# Patient Record
Sex: Female | Born: 1990
Health system: Southern US, Community
[De-identification: ages and names within clinical notes are randomized; demographics above are authoritative.]

## PROBLEM LIST (undated history)

## (undated) ENCOUNTER — Inpatient Hospital Stay (HOSPITAL_COMMUNITY): Payer: Self-pay

## (undated) DIAGNOSIS — I10 Essential (primary) hypertension: Secondary | ICD-10-CM

## (undated) DIAGNOSIS — O26619 Liver and biliary tract disorders in pregnancy, unspecified trimester: Secondary | ICD-10-CM

## (undated) DIAGNOSIS — K8020 Calculus of gallbladder without cholecystitis without obstruction: Secondary | ICD-10-CM

## (undated) DIAGNOSIS — O24419 Gestational diabetes mellitus in pregnancy, unspecified control: Secondary | ICD-10-CM

## (undated) HISTORY — DX: Calculus of gallbladder without cholecystitis without obstruction: O26.619

## (undated) HISTORY — DX: Calculus of gallbladder without cholecystitis without obstruction: K80.20

## (undated) HISTORY — PX: NO PAST SURGERIES: SHX2092

---

## 2014-05-05 NOTE — L&D Delivery Note (Cosign Needed)
Delivery Note   Patient presented for IOL 2/2 cholestasis. 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 Ripened with cytotec and foley bulb, then pitocin started. At 11:00 AM patient complete and pushed for 45 minutes before we were called for delivery, and after another 15 minutes of pushing there was delivery. Decels with the second half of pushing, with good recovery.  At 11:53 AM a viable female was delivered via Vaginal, Spontaneous Delivery (Presentation: ; Occiput Anterior).  APGAR: 8, 9; weight 3425 g .   Placenta status: Intact, Spontaneous.  Cord: 3 vessels with the following complications: partial third degree perineal laceration.  Cord pH: not obtained  Anesthesia: Epidural  Episiotomy: None Lacerations: 3rd degree partial Suture Repair: external anal sphincter reinforced with two interrupted 3-0 vicryl sutures; perineal laceration repaired with 3-0 vicryl as well Est. Blood Loss (mL):  423 ml  Mom to postpartum.  Baby to Couplet care / Skin to Skin.  Amanda Ellison 02/09/2015, 12:41 PM

## 2014-07-19 LAB — PREGNANCY, URINE: Preg Test, Ur: POSITIVE

## 2014-08-07 ENCOUNTER — Inpatient Hospital Stay (HOSPITAL_COMMUNITY)
Admission: AD | Admit: 2014-08-07 | Discharge: 2014-08-07 | Disposition: A | Discharge status: Home / Self Care | Payer: Medicaid Other | Source: Ambulatory Visit | Attending: Obstetrics & Gynecology | Admitting: Obstetrics & Gynecology

## 2014-08-07 ENCOUNTER — Encounter (HOSPITAL_COMMUNITY): Payer: Self-pay | Admitting: Emergency Medicine

## 2014-08-07 DIAGNOSIS — O9989 Other specified diseases and conditions complicating pregnancy, childbirth and the puerperium: Secondary | ICD-10-CM

## 2014-08-07 DIAGNOSIS — R109 Unspecified abdominal pain: Secondary | ICD-10-CM | POA: Diagnosis not present

## 2014-08-07 DIAGNOSIS — O26899 Other specified pregnancy related conditions, unspecified trimester: Secondary | ICD-10-CM

## 2014-08-07 DIAGNOSIS — O219 Vomiting of pregnancy, unspecified: Secondary | ICD-10-CM

## 2014-08-07 DIAGNOSIS — Z3A10 10 weeks gestation of pregnancy: Secondary | ICD-10-CM | POA: Insufficient documentation

## 2014-08-07 DIAGNOSIS — O210 Mild hyperemesis gravidarum: Secondary | ICD-10-CM | POA: Diagnosis not present

## 2014-08-07 LAB — CBC WITH DIFFERENTIAL/PLATELET
BASOS ABS: 0 10*3/uL (ref 0.0–0.1)
BASOS PCT: 0 % (ref 0–1)
Eosinophils Absolute: 0 10*3/uL (ref 0.0–0.7)
Eosinophils Relative: 0 % (ref 0–5)
HCT: 35.2 % — ABNORMAL LOW (ref 36.0–46.0)
Hemoglobin: 11.7 g/dL — ABNORMAL LOW (ref 12.0–15.0)
Lymphocytes Relative: 28 % (ref 12–46)
Lymphs Abs: 1.9 10*3/uL (ref 0.7–4.0)
MCH: 26.7 pg (ref 26.0–34.0)
MCHC: 33.2 g/dL (ref 30.0–36.0)
MCV: 80.2 fL (ref 78.0–100.0)
MONO ABS: 0.5 10*3/uL (ref 0.1–1.0)
Monocytes Relative: 8 % (ref 3–12)
NEUTROS ABS: 4.3 10*3/uL (ref 1.7–7.7)
NEUTROS PCT: 64 % (ref 43–77)
Platelets: 276 10*3/uL (ref 150–400)
RBC: 4.39 MIL/uL (ref 3.87–5.11)
RDW: 13.3 % (ref 11.5–15.5)
WBC: 6.8 10*3/uL (ref 4.0–10.5)

## 2014-08-07 LAB — URINALYSIS, ROUTINE W REFLEX MICROSCOPIC
Bilirubin Urine: NEGATIVE
Glucose, UA: NEGATIVE mg/dL
Hgb urine dipstick: NEGATIVE
Ketones, ur: NEGATIVE mg/dL
LEUKOCYTES UA: NEGATIVE
NITRITE: NEGATIVE
PROTEIN: NEGATIVE mg/dL
Specific Gravity, Urine: 1.02 (ref 1.005–1.030)
Urobilinogen, UA: 0.2 mg/dL (ref 0.0–1.0)
pH: 6.5 (ref 5.0–8.0)

## 2014-08-07 LAB — POCT PREGNANCY, URINE: Preg Test, Ur: POSITIVE — AB

## 2014-08-07 MED ORDER — PROMETHAZINE HCL 12.5 MG PO TABS
12.5000 mg | ORAL_TABLET | Freq: Four times a day (QID) | ORAL | Status: DC | PRN
Start: 2014-08-07 — End: 2014-10-14

## 2014-08-07 MED ORDER — PROMETHAZINE HCL 25 MG PO TABS
25.0000 mg | ORAL_TABLET | Freq: Once | ORAL | Status: AC
  Administered 2014-08-07: 25 mg via ORAL
  Filled 2014-08-07: qty 1

## 2014-08-07 NOTE — MAU Provider Note (Signed)
History     CSN: [id number]  Arrival date and time: 08/07/14 1703   None     Chief Complaint  Patient presents with  . Influenza   HPI Comments: Ms.Hershey Zareth is a [age] y.o. female who presents with multiple complaints. She is G1P0 at 5744w4d she is concerned about abdominal pain, nausea and vomiting. When she vomits she is concerned that she has a fever because she has the chills.  She has not tried anything for the nausea or vomiting and the vomiting is everyday. She vomits 2-3 times per day. Eating makes the vomiting worse.  The vomiting is keeping her up at night.   Abdominal Pain This is a new problem. The current episode started more than 1 month ago. The onset quality is gradual. The problem occurs intermittently. The pain is located in the suprapubic region. The pain is at a severity of 1/10. Associated symptoms include a fever, headaches, nausea and vomiting. Pertinent negatives include no dysuria or frequency.    The patient does not speak english and is present with her husband who speaks english. The patient and husband have asked that he interpret at this time.    OB History    Gravida Para Term Preterm AB TAB SAB Ectopic Multiple Living   1 0              History reviewed. No pertinent past medical history.  History reviewed. No pertinent past surgical history.  History reviewed. No pertinent family history.  History  Substance Use Topics  . Smoking status: Never Smoker   . Smokeless tobacco: Never Used  . Alcohol Use: No    Allergies: No Known Allergies  Prescriptions prior to admission  Medication Sig Dispense Refill Last Dose  . acetaminophen (TYLENOL) 650 MG CR tablet Take 650 mg by mouth every 8 (eight) hours as needed for pain.   Past Week at Unknown time  . amoxicillin (AMOXIL) 500 MG capsule Take 500 mg by mouth 3 (three) times daily.   Past Week at Unknown time   Results for orders placed or performed during the hospital encounter of 08/07/14  (from the past 48 hour(s))  Urinalysis, Routine w reflex microscopic     Status: None   Collection Time: 08/07/14  5:29 PM  Result Value Ref Range   Color, Urine YELLOW YELLOW   APPearance CLEAR CLEAR   Specific Gravity, Urine 1.020 1.005 - 1.030   pH 6.5 5.0 - 8.0   Glucose, UA NEGATIVE NEGATIVE mg/dL   Hgb urine dipstick NEGATIVE NEGATIVE   Bilirubin Urine NEGATIVE NEGATIVE   Ketones, ur NEGATIVE NEGATIVE mg/dL   Protein, ur NEGATIVE NEGATIVE mg/dL   Urobilinogen, UA 0.2 0.0 - 1.0 mg/dL   Nitrite NEGATIVE NEGATIVE   Leukocytes, UA NEGATIVE NEGATIVE    Comment: MICROSCOPIC NOT DONE ON URINES WITH NEGATIVE PROTEIN, BLOOD, LEUKOCYTES, NITRITE, OR GLUCOSE <1000 mg/dL.  Pregnancy, urine POC     Status: Abnormal   Collection Time: 08/07/14  5:32 PM  Result Value Ref Range   Preg Test, Ur POSITIVE (A) NEGATIVE    Comment:        THE SENSITIVITY OF THIS METHODOLOGY IS >24 mIU/mL   CBC with Differential     Status: Abnormal   Collection Time: 08/07/14  6:23 PM  Result Value Ref Range   WBC 6.8 4.0 - 10.5 K/uL   RBC 4.39 3.87 - 5.11 MIL/uL   Hemoglobin 11.7 (L) 12.0 - 15.0 g/dL   HCT 78.235.2 (  L) 36.0 - 46.0 %   MCV 80.2 78.0 - 100.0 fL   MCH 26.7 26.0 - 34.0 pg   MCHC 33.2 30.0 - 36.0 g/dL   RDW 09.8 11.9 - 14.7 %   Platelets 276 150 - 400 K/uL   Neutrophils Relative % 64 43 - 77 %   Neutro Abs 4.3 1.7 - 7.7 K/uL   Lymphocytes Relative 28 12 - 46 %   Lymphs Abs 1.9 0.7 - 4.0 K/uL   Monocytes Relative 8 3 - 12 %   Monocytes Absolute 0.5 0.1 - 1.0 K/uL   Eosinophils Relative 0 0 - 5 %   Eosinophils Absolute 0.0 0.0 - 0.7 K/uL   Basophils Relative 0 0 - 1 %   Basophils Absolute 0.0 0.0 - 0.1 K/uL    Review of Systems  Constitutional: Positive for fever and chills.  Gastrointestinal: Positive for nausea, vomiting and abdominal pain.  Genitourinary: Negative for dysuria, urgency and frequency.       Denies vaginal bleeding   Neurological: Positive for headaches.   Physical  Exam   Blood pressure 128/82, pulse 75, temperature 98.1 F (36.7 C), temperature source Oral, resp. rate 16, last menstrual period 05/25/2014.  Physical Exam  Constitutional: She is oriented to person, place, and time. She appears well-developed and well-nourished. No distress.  HENT:  Head: Normocephalic.  Eyes: Pupils are equal, round, and reactive to light.  Neck: Neck supple.  Cardiovascular: Normal rate and normal heart sounds.   Respiratory: Effort normal and breath sounds normal.  GI: Soft. She exhibits no distension. There is no tenderness. There is no rebound.  Musculoskeletal: Normal range of motion.  Neurological: She is alert and oriented to person, place, and time.  Skin: Skin is warm. She is not diaphoretic.  Psychiatric: Her behavior is normal.    MAU Course  Procedures  None  MDM + fetal heart tones by doppler  Phenergan 25 mg PO   Patient feels significantly better following phenergan and tolerated PO fluids.    Assessment and Plan    A:  1. Abdominal pain in pregnancy   2. Nausea and vomiting during pregnancy     P:  Discharge home in stable condition RX: Phenergan Return to MAU as needed, if symptoms worsen Small, frequent meals  Start prenatal care ASAP First trimester warning signs discussed.    Duane Lope, NP 08/07/2014 7:55 PM

## 2014-08-07 NOTE — MAU Note (Signed)
Per pt's partner, pt is here for fever and chills, and abdominal pain as well as vaginal pain at times.

## 2014-08-07 NOTE — Discharge Instructions (Signed)

## 2014-08-07 NOTE — MAU Note (Signed)
Pt states that nausea is resolved 

## 2014-08-10 ENCOUNTER — Other Ambulatory Visit (HOSPITAL_COMMUNITY): Payer: Self-pay | Admitting: Physician Assistant

## 2014-08-10 DIAGNOSIS — Z3682 Encounter for antenatal screening for nuchal translucency: Secondary | ICD-10-CM

## 2014-08-10 LAB — OB RESULTS CONSOLE GC/CHLAMYDIA
Chlamydia: NEGATIVE
Gonorrhea: NEGATIVE

## 2014-08-10 LAB — OB RESULTS CONSOLE RPR: RPR: NONREACTIVE

## 2014-08-10 LAB — OB RESULTS CONSOLE GBS: STREP GROUP B AG: POSITIVE

## 2014-08-10 LAB — OB RESULTS CONSOLE VARICELLA ZOSTER ANTIBODY, IGG: VARICELLA IGG: IMMUNE

## 2014-08-29 ENCOUNTER — Other Ambulatory Visit (HOSPITAL_COMMUNITY): Payer: Self-pay

## 2014-08-29 ENCOUNTER — Ambulatory Visit (HOSPITAL_COMMUNITY)
Admission: RE | Admit: 2014-08-29 | Discharge: 2014-08-29 | Disposition: A | Discharge status: Home / Self Care | Payer: Medicaid Other | Source: Ambulatory Visit | Attending: Physician Assistant | Admitting: Physician Assistant

## 2014-08-29 VITALS — BP 133/84 | HR 93 | Wt 183.5 lb

## 2014-08-29 DIAGNOSIS — Z3689 Encounter for other specified antenatal screening: Secondary | ICD-10-CM

## 2014-08-29 DIAGNOSIS — Z3A13 13 weeks gestation of pregnancy: Secondary | ICD-10-CM | POA: Insufficient documentation

## 2014-08-29 DIAGNOSIS — Z3682 Encounter for antenatal screening for nuchal translucency: Secondary | ICD-10-CM

## 2014-08-29 DIAGNOSIS — Z843 Family history of consanguinity: Secondary | ICD-10-CM

## 2014-08-29 DIAGNOSIS — Z36 Encounter for antenatal screening of mother: Secondary | ICD-10-CM | POA: Diagnosis not present

## 2014-08-31 DIAGNOSIS — Z843 Family history of consanguinity: Secondary | ICD-10-CM | POA: Insufficient documentation

## 2014-08-31 NOTE — Progress Notes (Signed)
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 The couple declined the use of a medical interpreter, with Mr. Ellison assisting with interpretation.   We began by reviewing the family history in detail. The couple reported that they are first cousins; Ms. Amanda Ellison and Amanda Ellison were full siblings to each other. Her mother reported died in her 89sdue to stomach problems, and Amanda Ellison died due to "old age." The family history was unremarkable for birth defects, intellectual disability, recurrent pregnancy loss, or known genetic conditions.   We reviewed, genes, chromosomes, and autosomal recessive inheritance. We discussed that children born to a consanguineous couple are at increased risk for genetic health problems.  This increase in risk is related to the possibility of passing on recessive genes. We explained that every person carries approximately 7-10 non-working genes that when received in a double dose results in recessive genetic conditions.  In general, unrelated couples have a relatively low risk of having a child with a recessive condition because the likelihood of both parents carrying the same non-working recessive gene is very low.  However, when a couple is related, they have inherited some of their genetic information from the same family member, which leads to an increased chance that they may carry the same recessive gene and have a child with a recessive condition.  For first cousin unions, the risk to have a child with a birth defect, intellectual disability,  or genetic condition is increased approximately 2-4% above the general population risk (3-5%).    We discussed that ethnicity based carrier screening for autosomal recessive conditions is available. Given [place] both the [place] we discussed the option of screening for sickle cell trait and other hemoglobin variants. We discussed that screening via hemoglobin electrophoresis is typically performed through the patient's OB office. This is available to the patient if not previously performed, and if desired. Additionally, we reviewed that hemoglobinopathies are routinely screened for as part of the [facility] newborn screening panel.  We also discussed the option of expanded carrier screening panels. This carrier screen provides carrier screening for common disease-causing mutations of greater than 100 autosomal recessive conditions and/or some X-linked conditions (when performed for a female). We reviewed that the prevalence of each condition varies (and often varies with ethnicity). Thus the couples' background risk to be a carrier for each of these various conditions would range, and in some cases be very low or unknown. Similarly, the detection rate varies with each condition and also varies in some cases with ethnicity, ranging from greater than 99% (in the case of Hemoglobinopathies) to 11% to unknown. We reviewed that a negative carrier screen would thus reduce but not eliminate the chance to be a carrier for these conditions.  For some conditions included on this pan-ethnic carrier screening panel, the pre-test carrier frequency and/or the detection rate is unknown. Thus, for some conditions included on the pan-ethnic carrier screening panel, the exact reduction of risk with a negative carrier screening result may not be able to be quantified. We reviewed that in the event that one partner is found to be a carrier for  one or more conditions, carrier screening would be available to the  partner for those conditions. When both partners are identified to be carriers for the same condition, prenatal diagnosis via amniocentesis would be available. We briefly discussed the risks, benefits, and limitations of amniocentesis.   Mr. Ellison expressed interest in pursuing expanded carrier screening for himself or the patient. However, they both currently do not have insurance. We reviewed the cost of the testing. Given the cost, they elected to defer expanded carrier screening until insurance coverage is obtained.  Additionally, the Ellison of the pregnancy is reportedly 26 years old. This couple was counseled that advanced paternal age (APA) is defined as paternal age greater than or equal to age 36.  Recent large-scale sequencing studies have shown that approximately 80% of de novo point mutations are of paternal origin.  Many studies have demonstrated a strong correlation between increased paternal age and de novo point mutations.  Although no specific data is available regarding fetal risks for fathers 4+ years old at conception, it is apparent that the overall risk for single gene conditions is increased.  To estimate the relative increase in risk of a genetic disorder with APA, the heritability of the disease must be considered.  Assuming an approximate 2x increase in risk for conditions that are exclusively paternal in origin, the risk for each individual condition is still relatively low.  It is estimated that the overall chance for a de novo mutation is ~0.5%.  There is a wide range of conditions which can be caused by new dominant gene mutations (achondroplasia, neurofibromatosis, Marfan syndrome etc.).  Genetic testing for each individual single gene condition is not warranted or available unless ultrasound or family history concerns lend suspicion to a specific condition.  We discussed the recommendation for a detailed ultrasound at 18+ weeks gestation and a follow up ultrasound at ~28  weeks to monitor fetal growth.  Amanda Ellison elected to have first trimester screening at the time of today's visit. We reviewed the conditions for which this screens including the detection rates and false positive rates. We discussed that first trimester screening is not diagnostic.   Amanda Ellison denied exposure to environmental toxins or chemical agents. She denied the use of alcohol, tobacco or street drugs. She denied significant viral illnesses during the course of her pregnancy. Her medical and surgical histories were noncontributory.   I counseled this couple regarding the above risks and available options.  The approximate face-to-face time with the genetic counselor was 35 minutes.  Chipper Oman, MS Certified Genetic Counselor 08/31/2014

## 2014-10-03 ENCOUNTER — Ambulatory Visit (HOSPITAL_COMMUNITY)
Admission: RE | Admit: 2014-10-03 | Discharge: 2014-10-03 | Disposition: A | Discharge status: Home / Self Care | Payer: Medicaid Other | Source: Ambulatory Visit | Attending: Physician Assistant | Admitting: Physician Assistant

## 2014-10-03 ENCOUNTER — Encounter (HOSPITAL_COMMUNITY): Payer: Self-pay

## 2014-10-03 DIAGNOSIS — Z3689 Encounter for other specified antenatal screening: Secondary | ICD-10-CM | POA: Insufficient documentation

## 2014-10-03 DIAGNOSIS — Z843 Family history of consanguinity: Secondary | ICD-10-CM | POA: Insufficient documentation

## 2014-10-03 DIAGNOSIS — Z3A18 18 weeks gestation of pregnancy: Secondary | ICD-10-CM | POA: Insufficient documentation

## 2014-10-03 DIAGNOSIS — Z36 Encounter for antenatal screening of mother: Secondary | ICD-10-CM | POA: Insufficient documentation

## 2014-10-14 ENCOUNTER — Encounter (HOSPITAL_COMMUNITY): Payer: Self-pay | Admitting: *Deleted

## 2014-10-14 ENCOUNTER — Inpatient Hospital Stay (HOSPITAL_COMMUNITY)
Admission: AD | Admit: 2014-10-14 | Discharge: 2014-10-14 | Disposition: A | Discharge status: Home / Self Care | Payer: Medicaid Other | Source: Ambulatory Visit | Attending: Obstetrics and Gynecology | Admitting: Obstetrics and Gynecology

## 2014-10-14 DIAGNOSIS — O26892 Other specified pregnancy related conditions, second trimester: Secondary | ICD-10-CM | POA: Diagnosis not present

## 2014-10-14 DIAGNOSIS — R51 Headache: Secondary | ICD-10-CM | POA: Insufficient documentation

## 2014-10-14 DIAGNOSIS — K831 Obstruction of bile duct: Secondary | ICD-10-CM

## 2014-10-14 DIAGNOSIS — O26899 Other specified pregnancy related conditions, unspecified trimester: Secondary | ICD-10-CM

## 2014-10-14 DIAGNOSIS — Z3A20 20 weeks gestation of pregnancy: Secondary | ICD-10-CM | POA: Diagnosis not present

## 2014-10-14 DIAGNOSIS — O9989 Other specified diseases and conditions complicating pregnancy, childbirth and the puerperium: Secondary | ICD-10-CM | POA: Diagnosis not present

## 2014-10-14 DIAGNOSIS — R519 Headache, unspecified: Secondary | ICD-10-CM

## 2014-10-14 DIAGNOSIS — O26642 Intrahepatic cholestasis of pregnancy, second trimester: Secondary | ICD-10-CM

## 2014-10-14 DIAGNOSIS — L299 Pruritus, unspecified: Secondary | ICD-10-CM

## 2014-10-14 DIAGNOSIS — O4402 Placenta previa specified as without hemorrhage, second trimester: Secondary | ICD-10-CM

## 2014-10-14 DIAGNOSIS — O26612 Liver and biliary tract disorders in pregnancy, second trimester: Secondary | ICD-10-CM

## 2014-10-14 DIAGNOSIS — R109 Unspecified abdominal pain: Secondary | ICD-10-CM | POA: Insufficient documentation

## 2014-10-14 DIAGNOSIS — O0930 Supervision of pregnancy with insufficient antenatal care, unspecified trimester: Secondary | ICD-10-CM

## 2014-10-14 LAB — URINALYSIS, ROUTINE W REFLEX MICROSCOPIC
Bilirubin Urine: NEGATIVE
GLUCOSE, UA: NEGATIVE mg/dL
Hgb urine dipstick: NEGATIVE
Ketones, ur: NEGATIVE mg/dL
Leukocytes, UA: NEGATIVE
NITRITE: NEGATIVE
PH: 6.5 (ref 5.0–8.0)
Protein, ur: NEGATIVE mg/dL
Specific Gravity, Urine: 1.015 (ref 1.005–1.030)
UROBILINOGEN UA: 0.2 mg/dL (ref 0.0–1.0)

## 2014-10-14 MED ORDER — ACETAMINOPHEN 325 MG PO TABS
650.0000 mg | ORAL_TABLET | Freq: Once | ORAL | Status: AC
  Administered 2014-10-14: 650 mg via ORAL
  Filled 2014-10-14: qty 2

## 2014-10-14 MED ORDER — PNV PRENATAL PLUS MULTIVITAMIN 27-1 MG PO TABS: 1.0000 | ORAL_TABLET | Freq: Every day | ORAL | Status: DC

## 2014-10-14 NOTE — Discharge Instructions (Signed)
Safe Over the Counter Medications in Pregnancy   Acne:  Benzoyl Peroxide  Salicylic Acid   Backache/Headache:  Tylenol: 2 regular strength every 4 hours OR        2 Extra strength every 6 hours   Colds/Coughs/Allergies:  Benadryl (alcohol free) 25 mg every 6 hours as needed  Breath right strips  Claritin  Cepacol throat lozenges  Chloraseptic throat spray  Cold-Eeze- up to three times per day  Cough drops, alcohol free  Flonase (by prescription only)  Guaifenesin  Mucinex  Robitussin DM (plain only, alcohol free)  Saline nasal spray/drops  Sudafed (pseudoephedrine) & Actifed * use only after [redacted] weeks gestation and if you do not have high blood pressure  Tylenol  Vicks Vaporub  Zinc lozenges  Zyrtec   Constipation:  Colace  Ducolax suppositories  Fleet enema  Glycerin suppositories  Metamucil  Milk of magnesia  Miralax  Senokot  Smooth move tea   Diarrhea:  Kaopectate  Imodium A-D   *NO pepto Bismol   Hemorrhoids:  Anusol  Anusol HC  Preparation H  Tucks   Indigestion:  Tums  Maalox  Mylanta  Zantac  Pepcid   Insomnia:  Benadryl (alcohol free) 25mg  every 6 hours as needed  Tylenol PM  Unisom, no Gelcaps   Leg Cramps:  Tums  MagGel   Nausea/Vomiting:  Bonine  Dramamine  Emetrol  Ginger extract  Sea bands  Meclizine  Nausea medication to take during pregnancy:  Unisom (doxylamine succinate 25 mg tablets) Take one tablet daily at bedtime. If symptoms are not adequately controlled, the dose can be increased to a maximum recommended dose of two tablets daily (1/2 tablet in the morning, 1/2 tablet mid-afternoon and one at bedtime).  Vitamin B6 100mg  tablets. Take one tablet twice a day (up to 200 mg per day).   Skin Rashes:  Aveeno products  Benadryl cream or 25mg  every 6 hours as needed  Calamine Lotion  1% cortisone cream   Yeast infection:  Gyne-lotrimin 7  Monistat 7    **If taking multiple medications, please check  labels to avoid duplicating the same active ingredients  **take medication as directed on the label  ** Do not exceed 4000 mg of tylenol in 24 hours  **Do not take medications that contain aspirin or ibuprofen     Abdominal Pain During Pregnancy Abdominal pain is common in pregnancy. Most of the time, it does not cause harm. There are many causes of abdominal pain. Some causes are more serious than others. Some of the causes of abdominal pain in pregnancy are easily diagnosed. Occasionally, the diagnosis takes time to understand. Other times, the cause is not determined. Abdominal pain can be a sign that something is very wrong with the pregnancy, or the pain may have nothing to do with the pregnancy at all. For this reason, always tell your health care provider if you have any abdominal discomfort. HOME CARE INSTRUCTIONS  Monitor your abdominal pain for any changes. The following actions may help to alleviate any discomfort you are experiencing:  Do not have sexual intercourse or put anything in your vagina until your symptoms go away completely.  Get plenty of rest until your pain improves.  Drink clear fluids if you feel nauseous. Avoid solid food as long as you are uncomfortable or nauseous.  Only take over-the-counter or prescription medicine as directed by your health care provider.  Keep all follow-up appointments with your health care provider. SEEK IMMEDIATE MEDICAL CARE IF:  You are bleeding, leaking fluid, or passing tissue from the vagina.  You have increasing pain or cramping.  You have persistent vomiting.  You have painful or bloody urination.  You have a fever.  You notice a decrease in your baby's movements.  You have extreme weakness or feel faint.  You have shortness of breath, with or without abdominal pain.  You develop a severe headache with abdominal pain.  You have abnormal vaginal discharge with abdominal pain.  You have persistent diarrhea.  You  have abdominal pain that continues even after rest, or gets worse. MAKE SURE YOU:   Understand these instructions.  Will watch your condition.  Will get help right away if you are not doing well or get worse. Document Released: 04/21/2005 Document Revised: 02/09/2013 Document Reviewed: 11/18/2012 [facility] Patient Information 2015 [place], [place]. This information is not intended to replace advice given to you by your health care provider. Make sure you discuss any questions you have with your health care provider.

## 2014-10-14 NOTE — MAU Provider Note (Signed)
History     CSN: [id number]  Arrival date and time: 10/14/14 3435   First Provider Initiated Contact with Patient 10/14/14 1926      Chief Complaint  Patient presents with  . Headache  . Abdominal Pain   HPI  Ms. Kayly Wright is a 24 y.o. G1P0 at [redacted]w[redacted]d here with report of intermittent headache that started at noon today.  Denies problem with vision.  Has not tried any medication for headache.  Also reports  lower abdominal pain x1 week.  Pain is described as sharp after urination is completed.  No pain at the time of urination.   No prenatal care to date.  Desires to have care at [facility].  Complaining of itching all over body.  No rash noted.  No change in soap/lotion/detergents.  Takes hot showers.   History reviewed. No pertinent past medical history.  History reviewed. No pertinent past surgical history.  History reviewed. No pertinent family history.  History  Substance Use Topics  . Smoking status: Never Smoker   . Smokeless tobacco: Never Used  . Alcohol Use: No    Allergies: No Known Allergies  Prescriptions prior to admission  Medication Sig Dispense Refill Last Dose  . acetaminophen (TYLENOL) 650 MG CR tablet Take 650 mg by mouth every 8 (eight) hours as needed for pain.   Past Week at Unknown time  . amoxicillin (AMOXIL) 500 MG capsule Take 500 mg by mouth 3 (three) times daily.   Past Week at Unknown time  . promethazine (PHENERGAN) 12.5 MG tablet Take 1 tablet (12.5 mg total) by mouth every 6 (six) hours as needed for nausea or vomiting. 30 tablet 0     Review of Systems  Constitutional: Negative for fever and chills.  Eyes: Negative for blurred vision, double vision and photophobia.  Gastrointestinal: Positive for abdominal pain. Negative for nausea and vomiting.  Genitourinary: Negative for dysuria, urgency and hematuria.  Musculoskeletal: Negative for back pain.  Neurological: Positive for headaches.  All other systems reviewed and  are negative.  Physical Exam   Blood pressure 128/74, pulse 81, temperature 98.1 F (36.7 C), temperature source Oral, resp. rate 18, weight 88.451 kg (195 lb), last menstrual period 05/25/2014, SpO2 100 %.  Physical Exam  Constitutional: She appears well-developed and well-nourished. No distress.  HENT:  Head: Normocephalic and atraumatic.  Eyes: EOM are normal.  Neck: Normal range of motion.  Cardiovascular: Normal rate.   Respiratory: Effort normal. No respiratory distress.  GI: Soft. There is no tenderness.  Musculoskeletal: Normal range of motion.  Neurological: She is alert.  Skin: Skin is warm and dry. No rash noted.   Results for orders placed or performed during the hospital encounter of 10/14/14 (from the past 24 hour(s))  Urinalysis, Routine w reflex microscopic (not at [facility])     Status: None   Collection Time: 10/14/14  6:45 PM  Result Value Ref Range   Color, Urine YELLOW YELLOW   APPearance CLEAR CLEAR   Specific Gravity, Urine 1.015 1.005 - 1.030   pH 6.5 5.0 - 8.0   Glucose, UA NEGATIVE NEGATIVE mg/dL   Hgb urine dipstick NEGATIVE NEGATIVE   Bilirubin Urine NEGATIVE NEGATIVE   Ketones, ur NEGATIVE NEGATIVE mg/dL   Protein, ur NEGATIVE NEGATIVE mg/dL   Urobilinogen, UA 0.2 0.0 - 1.0 mg/dL   Nitrite NEGATIVE NEGATIVE   Leukocytes, UA NEGATIVE NEGATIVE    MAU Course  Procedures  MDM No evidence for UTI per u/a No rash  to go along with itching.  No new soaps/detergents/lotions.  Pt takes hot showers.  Bile salts pending to eval for possibly cholestasis. HA completely improved with Tylenol .    Assessment and Plan   Marlis Edelson 10/14/2014, 7:27 PM   Pt's husband used as interpreter for visit.    A:  1. Headache in pregnancy, second trimester   2. Itching   3. Abdominal pain in pregnancy      P:  Discharge to home  OTC Tylenol for HA OTC Benadryl to help with itching Apply lotion prn - esp after hot shower PNC as directed PNV  qd Bile salts pending Patient may return to MAU as needed or if her condition were to change or worsen

## 2014-10-14 NOTE — Progress Notes (Addendum)
Written and verbal d/c instructions given and understanding voiced. Husband helped with d/c instructions. Husband voices understanding to call [facility] clinic Monday morning to make appt for pt to begin prenatal care. Number for clinic circled on d/c instructions. Knows they can return to MAU anytime for any concerns

## 2014-10-14 NOTE — MAU Note (Signed)
Patient presents to MAU with c/o headache and lower abdominal pain x1 week. Patient states that abdominal pain is worse with urination.

## 2014-10-16 LAB — BILE ACIDS, TOTAL: BILE ACIDS TOTAL: 27.7 umol/L — AB (ref 4.7–24.5)

## 2014-10-17 DIAGNOSIS — K831 Obstruction of bile duct: Secondary | ICD-10-CM

## 2014-10-17 DIAGNOSIS — O26612 Liver and biliary tract disorders in pregnancy, second trimester: Secondary | ICD-10-CM

## 2014-10-19 ENCOUNTER — Ambulatory Visit (INDEPENDENT_AMBULATORY_CARE_PROVIDER_SITE_OTHER): Payer: Medicaid Other | Admitting: Family

## 2014-10-19 ENCOUNTER — Encounter: Payer: Medicaid Other | Admitting: Family

## 2014-10-19 ENCOUNTER — Encounter: Payer: Self-pay | Admitting: Family

## 2014-10-19 VITALS — BP 144/70 | HR 92 | Temp 98.6°F | Ht 68.5 in | Wt 199.5 lb

## 2014-10-19 DIAGNOSIS — O26612 Liver and biliary tract disorders in pregnancy, second trimester: Secondary | ICD-10-CM

## 2014-10-19 DIAGNOSIS — O0930 Supervision of pregnancy with insufficient antenatal care, unspecified trimester: Secondary | ICD-10-CM

## 2014-10-19 DIAGNOSIS — K831 Obstruction of bile duct: Secondary | ICD-10-CM

## 2014-10-19 LAB — COMPREHENSIVE METABOLIC PANEL
ALK PHOS: 60 U/L (ref 39–117)
ALT: 15 U/L (ref 0–35)
AST: 18 U/L (ref 0–37)
Albumin: 3.3 g/dL — ABNORMAL LOW (ref 3.5–5.2)
BUN: 5 mg/dL — AB (ref 6–23)
CO2: 24 mEq/L (ref 19–32)
CREATININE: 0.45 mg/dL — AB (ref 0.50–1.10)
Calcium: 8.8 mg/dL (ref 8.4–10.5)
Chloride: 103 mEq/L (ref 96–112)
Glucose, Bld: 101 mg/dL — ABNORMAL HIGH (ref 70–99)
Potassium: 4.5 mEq/L (ref 3.5–5.3)
Sodium: 135 mEq/L (ref 135–145)
Total Bilirubin: 0.2 mg/dL (ref 0.2–1.2)
Total Protein: 6.2 g/dL (ref 6.0–8.3)

## 2014-10-19 LAB — POCT URINALYSIS DIP (DEVICE)
Bilirubin Urine: NEGATIVE
Glucose, UA: NEGATIVE mg/dL
HGB URINE DIPSTICK: NEGATIVE
Ketones, ur: NEGATIVE mg/dL
Leukocytes, UA: NEGATIVE
Nitrite: NEGATIVE
Protein, ur: NEGATIVE mg/dL
Specific Gravity, Urine: 1.02 (ref 1.005–1.030)
Urobilinogen, UA: 0.2 mg/dL (ref 0.0–1.0)
pH: 6.5 (ref 5.0–8.0)

## 2014-10-19 MED ORDER — URSODIOL 300 MG PO CAPS: 300.0000 mg | ORAL_CAPSULE | Freq: Three times a day (TID) | ORAL | Status: DC

## 2014-10-19 NOTE — Progress Notes (Signed)
Used husband to interpret with  [employer] interpreter#[id number] assist per patient request.

## 2014-10-19 NOTE — Patient Instructions (Signed)
AREA PEDIATRIC/FAMILY PRACTICE PHYSICIANS  [facility] [address] [place], [place] [address] Phone - [phone]   Fax - [phone]  JACK AMOS [address] [place], [place]  [address] Phone - [phone]   Fax - [phone]  [facility] [address] [place], [place]  [address] Phone - [phone]   Fax - [phone]  [facility] [address] [place], [place]  [address] Phone - [phone]   Fax - [phone]  [facility] [address] [place], [place]  [address] Phone - [phone]   Fax - [phone]  [facility] [address] [place], [place]  [address] Phone - [phone]   Fax - [phone]  [facility] [address] [place], [place]  [address] Phone - [phone]   Fax - [phone]  [facility] AT [facility] [address] [place], [address] [place], [place]  [address] Phone - [phone]   Fax - [phone]  [facility] AT [facility] [address] [place], [place]  [address] Phone - [phone]   Fax - [phone] [facility] [place] [address] [place], [place]  [address] Phone - [phone]   Fax - [phone]  [facility] [address] [place], [place]  [address] Phone - [phone]   Fax - [phone]  [facility] [address], [address] [place], [place]  [address] Phone - [phone]   Fax - [phone]  [facility] [place] [address] [place], [place]  [address] Phone - [phone]   Fax - [phone]  [facility] [address] [place], [place]  [address] Phone - [phone]  [facility] [address] [place], [place]  [address] Phone - [phone]   Fax - [phone]  [facility] [address] [place], [place]  [address] Phone - [phone]   Fax - [phone]  [facility] [address] [place], [place]  [address] Phone - [phone]   Fax - [phone]  [facility] [address] [place], [place]  [address] Phone - [phone]   Fax - [phone]   [facility] [address] [place], [place]  [address] Phone - [phone]   Fax - [phone]  [facility] [address] [place], [place]  [address] Phone - [phone]   Fax - [phone]  DAVID RUBIN [address] [place], [place]  [address] Phone - [phone]   Fax - [phone]  [facility] [address] [place], [place]  [address] Phone - [phone]   Fax - [phone]  [facility] Alita Chyle [address] [place], [place]  [address] Phone - [phone]   Fax - [phone] Gerarda Fraction [address] [place], [place]  [address] Phone - [phone]   Fax - [phone]  [facility] [address] [place], [place]  [address] Phone - [phone]   Fax - [phone]  [facility] [place] [address] [place], [place]  [address] Phone - [phone]   Fax - [phone]   Primary Care Resources:  [facility]  [address] [place] [address] Ph [phone] Every 2nd Saturday 9am-12pm  [email or web address] FREE Services  - [facility]  [address]  [address] [place] Ph [phone]  [address] [place] Ph [phone]  [email or web address] $45 per visit/Walk-in only  - [facility]  [address] [place] [address] Ph [phone]  1st & 3rd Saturday of each month 9:30am-12:30pm [email or web address] Sliding fee scale/Call to make an appointment  - [facility]  [address]  [place] Ph [phone]  Hours Mon-Fri 9am-7pm & Sat 9am-1pm [email or web address] Visits start at $45 per visit/Call to make an appointment  [facility]  [address] [place] [address] Ph [phone]  Hours Mon-Wed 8:30am-5pm & Thurs 8:30am-8pm $5 per visit/Call for an eligibility appointment

## 2014-10-19 NOTE — Progress Notes (Signed)
   Subjective:    Amanda Ellison is a G1P0 [redacted]w[redacted]d being seen today for her first obstetrical visit.  Her obstetrical history is significant for placenta previa and elevated bile acids. Pt seen in [facility] for increased itching and lower abdominal pain with urination.  Bile acids obtained.  Patient does intend to breast feed. Pregnancy history fully reviewed.  Patient reports generalized itching and pelvic pain with urination. Ceasar Mons Vitals:   10/19/14 1501 10/19/14 1513  BP: 144/70   Pulse: 92   Temp: 98.6 F (37 C)   Height:  5' 8.5" (1.74 m)  Weight: 199 lb 8 oz (90.493 kg)     HISTORY: OB History  Gravida Para Term Preterm AB SAB TAB Ectopic Multiple Living  1 0            # Outcome Date GA Lbr Len/2nd Weight Sex Delivery Anes PTL Lv  1 Current              History reviewed. No pertinent past medical history. History reviewed. No pertinent past surgical history. No family history on file.   Exam    BP 144/70 mmHg  Pulse 92  Temp(Src) 98.6 F (37 C)  Ht 5' 8.5" (1.74 m)  Wt 199 lb 8 oz (90.493 kg)  BMI 29.89 kg/m2  LMP 05/25/2014 Uterine Size: size equals dates  Pelvic Exam:    Perineum: No Hemorrhoids, Normal Perineum   Vulva: normal   Vagina:  normal mucosa, normal discharge, no palpable nodules   pH: Not done   Cervix: Not examined due to previa   Adnexa: Not examined due to previa     Bony Pelvis: Adequate  System: Breast:  No nipple retraction or dimpling, No nipple discharge or bleeding, No axillary or supraclavicular adenopathy, Normal to palpation without dominant masses   Skin: normal coloration and turgor, no rashes    Neurologic: negative   Extremities: normal strength, tone, and muscle mass   HEENT neck supple with midline trachea and thyroid without masses   Mouth/Teeth mucous membranes moist, pharynx normal without lesions   Neck supple and no masses   Cardiovascular: regular rate and rhythm, no murmurs or gallops   Respiratory:  appears  well, vitals normal, no respiratory distress, acyanotic, normal RR, neck free of mass or lymphadenopathy, chest clear, no wheezing, crepitations, rhonchi, normal symmetric air entry   Abdomen: soft, non-tender; bowel sounds normal; no masses,  no organomegaly   Urinary: urethral meatus normal      Assessment:    Pregnancy: G1P0 Patient Active Problem List   Diagnosis Date Noted  . 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 Placenta previa antepartum in second trimester 10/14/2014  . Late prenatal care affecting pregnancy 10/14/2014  . 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 Encounter for (NT) nuchal translucency scan         Plan:     Initial labs drawn.  Add CMP Prenatal vitamins. Problem list reviewed and updated. Genetic Screening discussed; NT previously collected; AFP today  Ultrasound discussed; fetal survey: ordered in two weeks RX Ursodiol Follow up in 2 weeks.  Marlis Edelson 10/19/2014

## 2014-10-20 LAB — PRENATAL PROFILE (SOLSTAS)
Antibody Screen: NEGATIVE
Basophils Absolute: 0 K/uL (ref 0.0–0.1)
Basophils Relative: 0 % (ref 0–1)
Eosinophils Absolute: 0.1 K/uL (ref 0.0–0.7)
Eosinophils Relative: 1 % (ref 0–5)
HCT: 33.8 % — ABNORMAL LOW (ref 36.0–46.0)
HIV 1&2 Ab, 4th Generation: NONREACTIVE
Hemoglobin: 10.6 g/dL — ABNORMAL LOW (ref 12.0–15.0)
Hepatitis B Surface Ag: NEGATIVE
Lymphocytes Relative: 19 % (ref 12–46)
Lymphs Abs: 1.8 K/uL (ref 0.7–4.0)
MCH: 25.1 pg — ABNORMAL LOW (ref 26.0–34.0)
MCHC: 31.4 g/dL (ref 30.0–36.0)
MCV: 79.9 fL (ref 78.0–100.0)
MPV: 10.5 fL (ref 8.6–12.4)
Monocytes Absolute: 1.1 K/uL — ABNORMAL HIGH (ref 0.1–1.0)
Monocytes Relative: 11 % (ref 3–12)
Neutro Abs: 6.7 K/uL (ref 1.7–7.7)
Neutrophils Relative %: 69 % (ref 43–77)
Platelets: 286 K/uL (ref 150–400)
RBC: 4.23 MIL/uL (ref 3.87–5.11)
RDW: 14.6 % (ref 11.5–15.5)
Rh Type: NEGATIVE
Rubella: 22.5 {index} — ABNORMAL HIGH
WBC: 9.7 K/uL (ref 4.0–10.5)

## 2014-10-20 LAB — GC/CHLAMYDIA PROBE AMP
CT PROBE, AMP APTIMA: NEGATIVE
GC Probe RNA: NEGATIVE

## 2014-10-21 LAB — CULTURE, OB URINE
COLONY COUNT: NO GROWTH
Organism ID, Bacteria: NO GROWTH

## 2014-10-22 ENCOUNTER — Encounter: Payer: Self-pay | Admitting: Family

## 2014-10-22 DIAGNOSIS — O26899 Other specified pregnancy related conditions, unspecified trimester: Secondary | ICD-10-CM

## 2014-10-22 DIAGNOSIS — Z6791 Unspecified blood type, Rh negative: Secondary | ICD-10-CM | POA: Insufficient documentation

## 2014-10-24 LAB — PRESCRIPTION MONITORING PROFILE (19 PANEL)
AMPHETAMINE/METH: NEGATIVE ng/mL
BARBITURATE SCREEN, URINE: NEGATIVE ng/mL
BENZODIAZEPINE SCREEN, URINE: NEGATIVE ng/mL
BUPRENORPHINE, URINE: NEGATIVE ng/mL
CANNABINOID SCRN UR: NEGATIVE ng/mL
COCAINE METABOLITES: NEGATIVE ng/mL
CREATININE, URINE: 68.68 mg/dL (ref >20.0)
Carisoprodol, Urine: NEGATIVE ng/mL
FENTANYL URINE: NEGATIVE ng/mL
MDMA URINE: NEGATIVE ng/mL
MEPERIDINE UR: NEGATIVE ng/mL
METHADONE SCREEN, URINE: NEGATIVE ng/mL
METHAQUALONE SCREEN (URINE): NEGATIVE ng/mL
Nitrites, Initial: NEGATIVE ug/mL
Opiate Screen, Urine: NEGATIVE ng/mL
Oxycodone Screen, Ur: NEGATIVE ng/mL
PHENCYCLIDINE, UR: NEGATIVE ng/mL
Propoxyphene: NEGATIVE ng/mL
Tapentadol, urine: NEGATIVE ng/mL
Tramadol Scrn, Ur: NEGATIVE ng/mL
ZOLPIDEM, URINE: NEGATIVE ng/mL
pH, Initial: 6.7 pH (ref 4.5–8.9)

## 2014-10-26 ENCOUNTER — Ambulatory Visit (INDEPENDENT_AMBULATORY_CARE_PROVIDER_SITE_OTHER): Payer: Medicaid Other | Admitting: Family

## 2014-10-26 ENCOUNTER — Encounter: Payer: Medicaid Other | Admitting: Family

## 2014-10-26 VITALS — BP 130/68 | HR 84 | Temp 98.3°F | Wt 201.8 lb

## 2014-10-26 DIAGNOSIS — Z3492 Encounter for supervision of normal pregnancy, unspecified, second trimester: Secondary | ICD-10-CM

## 2014-10-26 LAB — POCT URINALYSIS DIP (DEVICE)
Bilirubin Urine: NEGATIVE
GLUCOSE, UA: NEGATIVE mg/dL
Hgb urine dipstick: NEGATIVE
Ketones, ur: NEGATIVE mg/dL
Nitrite: NEGATIVE
PROTEIN: NEGATIVE mg/dL
Specific Gravity, Urine: 1.015 (ref 1.005–1.030)
UROBILINOGEN UA: 0.2 mg/dL (ref 0.0–1.0)
pH: 7 (ref 5.0–8.0)

## 2014-10-26 NOTE — Progress Notes (Signed)
[employer]preter ID [phone] Breastfeeding tip of the week reviewed

## 2014-10-27 NOTE — Progress Notes (Signed)
Reports picked up medication (Ursodiol). Improvement in itching.  +appt with MFM on 11/02/14 for ultrasound and consult.  +fetal movement.  Used pacific interpreter [phone].

## 2014-10-30 ENCOUNTER — Encounter: Payer: Medicaid Other | Admitting: Family Medicine

## 2014-11-01 ENCOUNTER — Encounter: Payer: Self-pay | Admitting: Obstetrics & Gynecology

## 2014-11-01 ENCOUNTER — Telehealth: Payer: Self-pay

## 2014-11-01 DIAGNOSIS — O99019 Anemia complicating pregnancy, unspecified trimester: Secondary | ICD-10-CM | POA: Insufficient documentation

## 2014-11-01 NOTE — Telephone Encounter (Signed)
Per Dr. Erin FullingHarraway-Smith, patient should begin taking ferrous sulfate BID for anemia. Attempted to contact patient with pacific interpreter ID# [phone]. Patient's husband answered and stated she is not available today but will be available tomorrow.

## 2014-11-02 ENCOUNTER — Ambulatory Visit (HOSPITAL_COMMUNITY)
Admission: RE | Admit: 2014-11-02 | Discharge: 2014-11-02 | Disposition: A | Discharge status: Home / Self Care | Payer: Medicaid Other | Source: Ambulatory Visit | Attending: Family | Admitting: Family

## 2014-11-02 DIAGNOSIS — Z3A23 23 weeks gestation of pregnancy: Secondary | ICD-10-CM | POA: Diagnosis not present

## 2014-11-02 DIAGNOSIS — Z843 Family history of consanguinity: Secondary | ICD-10-CM | POA: Insufficient documentation

## 2014-11-02 DIAGNOSIS — O409XX0 Polyhydramnios, unspecified trimester, not applicable or unspecified: Secondary | ICD-10-CM | POA: Insufficient documentation

## 2014-11-02 DIAGNOSIS — O26612 Liver and biliary tract disorders in pregnancy, second trimester: Secondary | ICD-10-CM

## 2014-11-02 DIAGNOSIS — O289 Unspecified abnormal findings on antenatal screening of mother: Secondary | ICD-10-CM

## 2014-11-02 DIAGNOSIS — O4402 Placenta previa specified as without hemorrhage, second trimester: Secondary | ICD-10-CM | POA: Diagnosis present

## 2014-11-02 DIAGNOSIS — K831 Obstruction of bile duct: Secondary | ICD-10-CM

## 2014-11-02 DIAGNOSIS — O4440 Low lying placenta NOS or without hemorrhage, unspecified trimester: Secondary | ICD-10-CM | POA: Insufficient documentation

## 2014-11-07 ENCOUNTER — Encounter: Payer: Self-pay | Admitting: *Deleted

## 2014-11-07 DIAGNOSIS — O99019 Anemia complicating pregnancy, unspecified trimester: Secondary | ICD-10-CM

## 2014-11-07 DIAGNOSIS — O4402 Placenta previa specified as without hemorrhage, second trimester: Secondary | ICD-10-CM

## 2014-11-07 DIAGNOSIS — O409XX0 Polyhydramnios, unspecified trimester, not applicable or unspecified: Secondary | ICD-10-CM

## 2014-11-07 DIAGNOSIS — O0930 Supervision of pregnancy with insufficient antenatal care, unspecified trimester: Secondary | ICD-10-CM

## 2014-11-07 DIAGNOSIS — K831 Obstruction of bile duct: Secondary | ICD-10-CM

## 2014-11-07 DIAGNOSIS — O26612 Liver and biliary tract disorders in pregnancy, second trimester: Secondary | ICD-10-CM

## 2014-11-07 DIAGNOSIS — O289 Unspecified abnormal findings on antenatal screening of mother: Secondary | ICD-10-CM

## 2014-11-07 DIAGNOSIS — Z843 Family history of consanguinity: Secondary | ICD-10-CM

## 2014-11-09 ENCOUNTER — Encounter: Payer: Medicaid Other | Admitting: Family Medicine

## 2014-11-09 NOTE — Telephone Encounter (Signed)
Unable to reach patient she is coming in on Monday for her regular visit. Note made to inform her at that time.

## 2014-11-13 ENCOUNTER — Ambulatory Visit (INDEPENDENT_AMBULATORY_CARE_PROVIDER_SITE_OTHER): Payer: Medicaid Other | Admitting: Family Medicine

## 2014-11-13 VITALS — BP 135/50 | HR 92 | Temp 98.7°F | Wt 205.9 lb

## 2014-11-13 DIAGNOSIS — O360121 Maternal care for anti-D [Rh] antibodies, second trimester, fetus 1: Secondary | ICD-10-CM

## 2014-11-13 DIAGNOSIS — O26612 Liver and biliary tract disorders in pregnancy, second trimester: Secondary | ICD-10-CM | POA: Diagnosis not present

## 2014-11-13 DIAGNOSIS — K831 Obstruction of bile duct: Secondary | ICD-10-CM

## 2014-11-13 DIAGNOSIS — O4412 Placenta previa with hemorrhage, second trimester: Secondary | ICD-10-CM

## 2014-11-13 DIAGNOSIS — O0930 Supervision of pregnancy with insufficient antenatal care, unspecified trimester: Secondary | ICD-10-CM | POA: Diagnosis not present

## 2014-11-13 DIAGNOSIS — O0932 Supervision of pregnancy with insufficient antenatal care, second trimester: Secondary | ICD-10-CM

## 2014-11-13 DIAGNOSIS — O4402 Placenta previa specified as without hemorrhage, second trimester: Secondary | ICD-10-CM

## 2014-11-13 LAB — POCT URINALYSIS DIP (DEVICE)
Bilirubin Urine: NEGATIVE
Glucose, UA: 500 mg/dL — AB
Hgb urine dipstick: NEGATIVE
Ketones, ur: NEGATIVE mg/dL
Nitrite: NEGATIVE
PH: 6.5 (ref 5.0–8.0)
Protein, ur: NEGATIVE mg/dL
SPECIFIC GRAVITY, URINE: 1.02 (ref 1.005–1.030)
Urobilinogen, UA: 0.2 mg/dL (ref 0.0–1.0)

## 2014-11-13 LAB — COMPREHENSIVE METABOLIC PANEL
ALT: 14 U/L (ref 0–35)
AST: 19 U/L (ref 0–37)
Albumin: 3.4 g/dL — ABNORMAL LOW (ref 3.5–5.2)
Alkaline Phosphatase: 68 U/L (ref 39–117)
BUN: 8 mg/dL (ref 6–23)
CO2: 23 mEq/L (ref 19–32)
Calcium: 9.1 mg/dL (ref 8.4–10.5)
Chloride: 102 mEq/L (ref 96–112)
Creat: 0.46 mg/dL — ABNORMAL LOW (ref 0.50–1.10)
Glucose, Bld: 143 mg/dL — ABNORMAL HIGH (ref 70–99)
POTASSIUM: 4.1 meq/L (ref 3.5–5.3)
SODIUM: 135 meq/L (ref 135–145)
Total Bilirubin: 0.3 mg/dL (ref 0.2–1.2)
Total Protein: 6.3 g/dL (ref 6.0–8.3)

## 2014-11-13 NOTE — Patient Instructions (Signed)
Breastfeeding Deciding to breastfeed is one of the best choices you can make for you and your baby. A change in hormones during pregnancy causes your breast tissue to grow and increases the number and size of your milk ducts. These hormones also allow proteins, sugars, and fats from your blood supply to make breast milk in your milk-producing glands. Hormones prevent breast milk from being released before your baby is born as well as prompt milk flow after birth. Once breastfeeding has begun, thoughts of your baby, as well as his or her sucking or crying, can stimulate the release of milk from your milk-producing glands.  BENEFITS OF BREASTFEEDING For Your Baby  Your first milk (colostrum) helps your baby's digestive system function better.   There are antibodies in your milk that help your baby fight off infections.   Your baby has a lower incidence of asthma, allergies, and sudden infant death syndrome.   The nutrients in breast milk are better for your baby than infant formulas and are designed uniquely for your baby's needs.   Breast milk improves your baby's brain development.   Your baby is less likely to develop other conditions, such as childhood obesity, asthma, or type 2 diabetes mellitus.  For You   Breastfeeding helps to create a very special bond between you and your baby.   Breastfeeding is convenient. Breast milk is always available at the correct temperature and costs nothing.   Breastfeeding helps to burn calories and helps you lose the weight gained during pregnancy.   Breastfeeding makes your uterus contract to its prepregnancy size faster and slows bleeding (lochia) after you give birth.   Breastfeeding helps to lower your risk of developing type 2 diabetes mellitus, osteoporosis, and breast or ovarian cancer later in life. SIGNS THAT YOUR BABY IS HUNGRY Early Signs of Hunger  Increased alertness or activity.  Stretching.  Movement of the head from  side to side.  Movement of the head and opening of the mouth when the corner of the mouth or cheek is stroked (rooting).  Increased sucking sounds, smacking lips, cooing, sighing, or squeaking.  Hand-to-mouth movements.  Increased sucking of fingers or hands. Late Signs of Hunger  Fussing.  Intermittent crying. Extreme Signs of Hunger Signs of extreme hunger will require calming and consoling before your baby will be able to breastfeed successfully. Do not wait for the following signs of extreme hunger to occur before you initiate breastfeeding:   Restlessness.  A loud, strong cry.   Screaming. BREASTFEEDING BASICS Breastfeeding Initiation  Find a comfortable place to sit or lie down, with your neck and back well supported.  Place a pillow or rolled up blanket under your baby to bring him or her to the level of your breast (if you are seated). Nursing pillows are specially designed to help support your arms and your baby while you breastfeed.  Make sure that your baby's abdomen is facing your abdomen.   Gently massage your breast. With your fingertips, massage from your chest wall toward your nipple in a circular motion. This encourages milk flow. You may need to continue this action during the feeding if your milk flows slowly.  Support your breast with 4 fingers underneath and your thumb above your nipple. Make sure your fingers are well away from your nipple and your baby's mouth.   Stroke your baby's lips gently with your finger or nipple.   When your baby's mouth is open wide enough, quickly bring your baby to your   breast, placing your entire nipple and as much of the colored area around your nipple (areola) as possible into your baby's mouth.   More areola should be visible above your baby's upper lip than below the lower lip.   Your baby's tongue should be between his or her lower gum and your breast.   Ensure that your baby's mouth is correctly positioned  around your nipple (latched). Your baby's lips should create a seal on your breast and be turned out (everted).  It is common for your baby to suck about 2-3 minutes in order to start the flow of breast milk. Latching Teaching your baby how to latch on to your breast properly is very important. An improper latch can cause nipple pain and decreased milk supply for you and poor weight gain in your baby. Also, if your baby is not latched onto your nipple properly, he or she may swallow some air during feeding. This can make your baby fussy. Burping your baby when you switch breasts during the feeding can help to get rid of the air. However, teaching your baby to latch on properly is still the best way to prevent fussiness from swallowing air while breastfeeding. Signs that your baby has successfully latched on to your nipple:    Silent tugging or silent sucking, without causing you pain.   Swallowing heard between every 3-4 sucks.    Muscle movement above and in front of his or her ears while sucking.  Signs that your baby has not successfully latched on to nipple:   Sucking sounds or smacking sounds from your baby while breastfeeding.  Nipple pain. If you think your baby has not latched on correctly, slip your finger into the corner of your baby's mouth to break the suction and place it between your baby's gums. Attempt breastfeeding initiation again. Signs of Successful Breastfeeding Signs from your baby:   A gradual decrease in the number of sucks or complete cessation of sucking.   Falling asleep.   Relaxation of his or her body.   Retention of a small amount of milk in his or her mouth.   Letting go of your breast by himself or herself. Signs from you:  Breasts that have increased in firmness, weight, and size 1-3 hours after feeding.   Breasts that are softer immediately after breastfeeding.  Increased milk volume, as well as a change in milk consistency and color by  the fifth day of breastfeeding.   Nipples that are not sore, cracked, or bleeding. Signs That Your Baby is Getting Enough Milk  Wetting at least 3 diapers in a 24-hour period. The urine should be clear and pale yellow by age 5 days.  At least 3 stools in a 24-hour period by age 5 days. The stool should be soft and yellow.  At least 3 stools in a 24-hour period by age 7 days. The stool should be seedy and yellow.  No loss of weight greater than 10% of birth weight during the first 3 days of age.  Average weight gain of 4-7 ounces (113-198 g) per week after age 4 days.  Consistent daily weight gain by age 5 days, without weight loss after the age of 2 weeks. After a feeding, your baby may spit up a small amount. This is common. BREASTFEEDING FREQUENCY AND DURATION Frequent feeding will help you make more milk and can prevent sore nipples and breast engorgement. Breastfeed when you feel the need to reduce the fullness of your breasts   or when your baby shows signs of hunger. This is called "breastfeeding on demand." Avoid introducing a pacifier to your baby while you are working to establish breastfeeding (the first 4-6 weeks after your baby is born). After this time you may choose to use a pacifier. Research has shown that pacifier use during the first year of a baby's life decreases the risk of sudden infant death syndrome (SIDS). Allow your baby to feed on each breast as long as he or she wants. Breastfeed until your baby is finished feeding. When your baby unlatches or falls asleep while feeding from the first breast, offer the second breast. Because newborns are often sleepy in the first few weeks of life, you may need to awaken your baby to get him or her to feed. Breastfeeding times will vary from baby to baby. However, the following rules can serve as a guide to help you ensure that your baby is properly fed:  Newborns (babies 4 weeks of age or younger) may breastfeed every 1-3  hours.  Newborns should not go longer than 3 hours during the day or 5 hours during the night without breastfeeding.  You should breastfeed your baby a minimum of 8 times in a 24-hour period until you begin to introduce solid foods to your baby at around 6 months of age. BREAST MILK PUMPING Pumping and storing breast milk allows you to ensure that your baby is exclusively fed your breast milk, even at times when you are unable to breastfeed. This is especially important if you are going back to work while you are still breastfeeding or when you are not able to be present during feedings. Your lactation consultant can give you guidelines on how long it is safe to store breast milk.  A breast pump is a machine that allows you to pump milk from your breast into a sterile bottle. The pumped breast milk can then be stored in a refrigerator or freezer. Some breast pumps are operated by hand, while others use electricity. Ask your lactation consultant which type will work best for you. Breast pumps can be purchased, but some hospitals and breastfeeding support groups lease breast pumps on a monthly basis. A lactation consultant can teach you how to hand express breast milk, if you prefer not to use a pump.  CARING FOR YOUR BREASTS WHILE YOU BREASTFEED Nipples can become dry, cracked, and sore while breastfeeding. The following recommendations can help keep your breasts moisturized and healthy:  Avoid using soap on your nipples.   Wear a supportive bra. Although not required, special nursing bras and tank tops are designed to allow access to your breasts for breastfeeding without taking off your entire bra or top. Avoid wearing underwire-style bras or extremely tight bras.  Air dry your nipples for 3-4minutes after each feeding.   Use only cotton bra pads to absorb leaked breast milk. Leaking of breast milk between feedings is normal.   Use lanolin on your nipples after breastfeeding. Lanolin helps to  maintain your skin's normal moisture barrier. If you use pure lanolin, you do not need to wash it off before feeding your baby again. Pure lanolin is not toxic to your baby. You may also hand express a few drops of breast milk and gently massage that milk into your nipples and allow the milk to air dry. In the first few weeks after giving birth, some women experience extremely full breasts (engorgement). Engorgement can make your breasts feel heavy, warm, and tender to the   touch. Engorgement peaks within 3-5 days after you give birth. The following recommendations can help ease engorgement:  Completely empty your breasts while breastfeeding or pumping. You may want to start by applying warm, moist heat (in the shower or with warm water-soaked hand towels) just before feeding or pumping. This increases circulation and helps the milk flow. If your baby does not completely empty your breasts while breastfeeding, pump any extra milk after he or she is finished.  Wear a snug bra (nursing or regular) or tank top for 1-2 days to signal your body to slightly decrease milk production.  Apply ice packs to your breasts, unless this is too uncomfortable for you.  Make sure that your baby is latched on and positioned properly while breastfeeding. If engorgement persists after 48 hours of following these recommendations, contact your health care provider or a lactation consultant. OVERALL HEALTH CARE RECOMMENDATIONS WHILE BREASTFEEDING  Eat healthy foods. Alternate between meals and snacks, eating 3 of each per day. Because what you eat affects your breast milk, some of the foods may make your baby more irritable than usual. Avoid eating these foods if you are sure that they are negatively affecting your baby.  Drink milk, fruit juice, and water to satisfy your thirst (about 10 glasses a day).   Rest often, relax, and continue to take your prenatal vitamins to prevent fatigue, stress, and anemia.  Continue  breast self-awareness checks.  Avoid chewing and smoking tobacco.  Avoid alcohol and drug use. Some medicines that may be harmful to your baby can pass through breast milk. It is important to ask your health care provider before taking any medicine, including all over-the-counter and prescription medicine as well as vitamin and herbal supplements. It is possible to become pregnant while breastfeeding. If birth control is desired, ask your health care provider about options that will be safe for your baby. SEEK MEDICAL CARE IF:   You feel like you want to stop breastfeeding or have become frustrated with breastfeeding.  You have painful breasts or nipples.  Your nipples are cracked or bleeding.  Your breasts are red, tender, or warm.  You have a swollen area on either breast.  You have a fever or chills.  You have nausea or vomiting.  You have drainage other than breast milk from your nipples.  Your breasts do not become full before feedings by the fifth day after you give birth.  You feel sad and depressed.  Your baby is too sleepy to eat well.  Your baby is having trouble sleeping.   Your baby is wetting less than 3 diapers in a 24-hour period.  Your baby has less than 3 stools in a 24-hour period.  Your baby's skin or the white part of his or her eyes becomes yellow.   Your baby is not gaining weight by 5 days of age. SEEK IMMEDIATE MEDICAL CARE IF:   Your baby is overly tired (lethargic) and does not want to wake up and feed.  Your baby develops an unexplained fever. Document Released: 04/21/2005 Document Revised: 04/26/2013 Document Reviewed: 10/13/2012 ExitCare Patient Information 2015 [employer]. This information is not intended to replace advice given to you by your health care provider. Make sure you discuss any questions you have with your health care provider.  

## 2014-11-13 NOTE — Progress Notes (Signed)
Subjective:  Maysie Rivenbark is a 24 y.o. G1P0 at 4743w4d being seen today for ongoing prenatal care.  Patient reports no complaints. Itching is improved with Actigall.  Contractions: Not present.  Vag. Bleeding: None. Movement: Present. Denies leaking of fluid.   The following portions of the patient's history were reviewed and updated as appropriate: allergies, current medications, past family history, past medical history, past social history, past surgical history and problem list.   Objective:   Filed Vitals:   11/13/14 0810 11/13/14 0814  BP: 144/68 135/50  Pulse: 94 92  Temp: 98.7 F (37.1 C)   Weight: 205 lb 14.4 oz (93.396 kg)     Fetal Status: Fetal Heart Rate (bpm): 152   Movement: Present     General:  Alert, oriented and cooperative. Patient is in no acute distress.  Skin: Skin is warm and dry. No rash noted.   Cardiovascular: Normal heart rate noted  Respiratory: Normal respiratory effort, no problems with respiration noted  Abdomen: Soft, gravid, appropriate for gestational age. Pain/Pressure: Present     Vaginal: Vag. Bleeding: None.    Vag D/C Character: Mucous  Extremities: Normal range of motion.  Edema: None  Mental Status: Normal mood and affect. Normal behavior. Normal judgment and thought content.   Urinalysis: Urine Protein: Negative Urine Glucose: Negative  Assessment and Plan:  Pregnancy: G1P0 at 8143w4d  1. 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 Rh negative, antepartum, second trimester, fetus 1 Rhogam next visit with 28 wks labs and TDaP  3. Late prenatal care affecting pregnancy Continue routine prenatal care.  4. Placenta previa antepartum in second trimester  - [facility] OB Follow Up; Future   Preterm labor symptoms and general obstetric precautions including but not limited to vaginal bleeding, contractions, leaking of fluid and fetal movement were  reviewed in detail with the patient.  Please refer to After Visit Summary for other counseling recommendations.   Return in 3 weeks (on 12/04/2014).   Reva Boresanya S Lada Fulbright, MD

## 2014-11-13 NOTE — Progress Notes (Signed)
Ultrasound  for AFI and placental location scheduled for August 11,2016 @ 8:00AM Personal interpreter present to make appointment.

## 2014-11-13 NOTE — Progress Notes (Signed)
Interpreter present for encounter Pt complains of headache, encouraged pt to take Tylenol, denies blurred vision and epigastric pain

## 2014-11-14 LAB — HEPATITIS C ANTIBODY: HCV Ab: NEGATIVE

## 2014-12-04 ENCOUNTER — Ambulatory Visit (INDEPENDENT_AMBULATORY_CARE_PROVIDER_SITE_OTHER): Payer: Medicaid Other | Admitting: Obstetrics & Gynecology

## 2014-12-04 VITALS — BP 152/87 | HR 90 | Temp 98.7°F | Wt 208.1 lb

## 2014-12-04 DIAGNOSIS — O0930 Supervision of pregnancy with insufficient antenatal care, unspecified trimester: Secondary | ICD-10-CM

## 2014-12-04 DIAGNOSIS — O0932 Supervision of pregnancy with insufficient antenatal care, second trimester: Secondary | ICD-10-CM

## 2014-12-04 DIAGNOSIS — Z23 Encounter for immunization: Secondary | ICD-10-CM | POA: Diagnosis not present

## 2014-12-04 DIAGNOSIS — O4412 Placenta previa with hemorrhage, second trimester: Secondary | ICD-10-CM

## 2014-12-04 DIAGNOSIS — O162 Unspecified maternal hypertension, second trimester: Secondary | ICD-10-CM | POA: Diagnosis not present

## 2014-12-04 DIAGNOSIS — O4402 Placenta previa specified as without hemorrhage, second trimester: Secondary | ICD-10-CM

## 2014-12-04 DIAGNOSIS — O163 Unspecified maternal hypertension, third trimester: Secondary | ICD-10-CM

## 2014-12-04 LAB — CBC
HEMATOCRIT: 35 % — AB (ref 36.0–46.0)
HEMOGLOBIN: 10.9 g/dL — AB (ref 12.0–15.0)
MCH: 23.7 pg — AB (ref 26.0–34.0)
MCHC: 31.1 g/dL (ref 30.0–36.0)
MCV: 76.3 fL — AB (ref 78.0–100.0)
MPV: 10.9 fL (ref 8.6–12.4)
Platelets: 272 10*3/uL (ref 150–400)
RBC: 4.59 MIL/uL (ref 3.87–5.11)
RDW: 14.9 % (ref 11.5–15.5)
WBC: 9.6 10*3/uL (ref 4.0–10.5)

## 2014-12-04 LAB — POCT URINALYSIS DIP (DEVICE)
Bilirubin Urine: NEGATIVE
GLUCOSE, UA: 250 mg/dL — AB
Hgb urine dipstick: NEGATIVE
KETONES UR: NEGATIVE mg/dL
Nitrite: NEGATIVE
PROTEIN: NEGATIVE mg/dL
Specific Gravity, Urine: 1.015 (ref 1.005–1.030)
UROBILINOGEN UA: 0.2 mg/dL (ref 0.0–1.0)
pH: 7 (ref 5.0–8.0)

## 2014-12-04 MED ORDER — LABETALOL HCL 200 MG PO TABS: 200.0000 mg | ORAL_TABLET | Freq: Two times a day (BID) | ORAL | Status: DC

## 2014-12-04 MED ORDER — TETANUS-DIPHTH-ACELL PERTUSSIS 5-2.5-18.5 LF-MCG/0.5 IM SUSP
0.5000 mL | Freq: Once | INTRAMUSCULAR | Status: AC
  Administered 2014-12-04: 0.5 mL via INTRAMUSCULAR

## 2014-12-04 MED ORDER — RHO D IMMUNE GLOBULIN 1500 UNIT/2ML IJ SOSY
300.0000 ug | PREFILLED_SYRINGE | Freq: Once | INTRAMUSCULAR | Status: AC
  Administered 2014-12-04: 300 ug via INTRAMUSCULAR

## 2014-12-04 NOTE — Progress Notes (Signed)
Subjective:  Amanda Ellison is a 24 y.o. M AA G1P0 at [redacted]w[redacted]d being seen today for ongoing prenatal care.  Patient reports no complaints.  Contractions: Not present.  Vag. Bleeding: None. Movement: Present. Denies leaking of fluid.   The following portions of the patient's history were reviewed and updated as appropriate: allergies, current medications, past family history, past medical history, past social history, past surgical history and problem list.   Objective:   Filed Vitals:   12/04/14 0824 12/04/14 0829  BP: 143/79 152/87  Pulse: 98 90  Temp: 98.7 F (37.1 C)   Weight: 208 lb 1.6 oz (94.394 kg)     Fetal Status: Fetal Heart Rate (bpm): 162 Fundal Height: 30 cm Movement: Present     General:  Alert, oriented and cooperative. Patient is in no acute distress.  Skin: Skin is warm and dry. No rash noted.   Cardiovascular: Normal heart rate noted  Respiratory: Normal respiratory effort, no problems with respiration noted  Abdomen: Soft, gravid, appropriate for gestational age. Pain/Pressure: Absent     Vaginal: Vag. Bleeding: None.    Vag D/C Character: White  Cervix: Not evaluated        Extremities: Normal range of motion.  Edema: None  Mental Status: Normal mood and affect. Normal behavior. Normal judgment and thought content.   Urinalysis: Urine Protein: Negative Urine Glucose: 2+  Assessment and Plan:  Pregnancy: G1P0 at [redacted]w[redacted]d  1. Late prenatal care affecting pregnancy  - CBC - HIV antibody - RPR - Glucose Tolerance, 1 HR (50g) - Tdap vaccine greater than or equal to 7yo IM - rhophylac 2. Placenta previa antepartum in second trimester She has a follow up u/s 12-14-14  3. Hypertension in pregnancy- Start labetolol 200 mg BID, baby asa daily. Check baseline 24 hour urine and cmeta.  3. Hypertension in pregnancy, third trimester  - Comprehensive metabolic panel - Protein, urine, 24 hour  Preterm labor symptoms and general obstetric precautions including but  not limited to vaginal bleeding, contractions, leaking of fluid and fetal movement were reviewed in detail with the patient. Please refer to After Visit Summary for other counseling recommendations.  Return in about 3 weeks (around 12/25/2014).   Allie Bossier, MD

## 2014-12-04 NOTE — Progress Notes (Signed)
Interpreter Areatha Keas

## 2014-12-05 LAB — COMPREHENSIVE METABOLIC PANEL
ALBUMIN: 3.2 g/dL — AB (ref 3.6–5.1)
ALT: 12 U/L (ref 6–29)
AST: 15 U/L (ref 10–30)
Alkaline Phosphatase: 78 U/L (ref 33–115)
BUN: 6 mg/dL — AB (ref 7–25)
CALCIUM: 8.8 mg/dL (ref 8.6–10.2)
CO2: 23 mmol/L (ref 20–31)
CREATININE: 0.48 mg/dL — AB (ref 0.50–1.10)
Chloride: 101 mmol/L (ref 98–110)
Glucose, Bld: 174 mg/dL — ABNORMAL HIGH (ref 65–99)
POTASSIUM: 4.5 mmol/L (ref 3.5–5.3)
Sodium: 133 mmol/L — ABNORMAL LOW (ref 135–146)
Total Bilirubin: 0.2 mg/dL (ref 0.2–1.2)
Total Protein: 6.2 g/dL (ref 6.1–8.1)

## 2014-12-05 LAB — GLUCOSE TOLERANCE, 1 HOUR (50G) W/O FASTING: Glucose, 1 Hour GTT: 178 mg/dL — ABNORMAL HIGH (ref 70–140)

## 2014-12-05 LAB — HIV ANTIBODY (ROUTINE TESTING W REFLEX): HIV 1&2 Ab, 4th Generation: NONREACTIVE

## 2014-12-05 LAB — RPR

## 2014-12-07 LAB — PROTEIN, URINE, 24 HOUR
PROTEIN 24H UR: 98 mg/d (ref <150)
PROTEIN, URINE: 17 mg/dL (ref 5–24)

## 2014-12-14 ENCOUNTER — Other Ambulatory Visit: Payer: Self-pay | Admitting: Family Medicine

## 2014-12-14 ENCOUNTER — Telehealth: Payer: Self-pay | Admitting: *Deleted

## 2014-12-14 ENCOUNTER — Encounter (HOSPITAL_COMMUNITY): Payer: Self-pay

## 2014-12-14 ENCOUNTER — Other Ambulatory Visit (HOSPITAL_COMMUNITY): Payer: Self-pay | Admitting: *Deleted

## 2014-12-14 ENCOUNTER — Ambulatory Visit (HOSPITAL_COMMUNITY)
Admission: RE | Admit: 2014-12-14 | Discharge: 2014-12-14 | Disposition: A | Discharge status: Home / Self Care | Payer: Medicaid Other | Source: Ambulatory Visit | Attending: Family Medicine | Admitting: Family Medicine

## 2014-12-14 DIAGNOSIS — O360130 Maternal care for anti-D [Rh] antibodies, third trimester, not applicable or unspecified: Secondary | ICD-10-CM

## 2014-12-14 DIAGNOSIS — O26612 Liver and biliary tract disorders in pregnancy, second trimester: Principal | ICD-10-CM

## 2014-12-14 DIAGNOSIS — K831 Obstruction of bile duct: Secondary | ICD-10-CM

## 2014-12-14 DIAGNOSIS — O4413 Placenta previa with hemorrhage, third trimester: Secondary | ICD-10-CM | POA: Insufficient documentation

## 2014-12-14 DIAGNOSIS — Z3A29 29 weeks gestation of pregnancy: Secondary | ICD-10-CM

## 2014-12-14 DIAGNOSIS — Z843 Family history of consanguinity: Secondary | ICD-10-CM

## 2014-12-14 DIAGNOSIS — O10919 Unspecified pre-existing hypertension complicating pregnancy, unspecified trimester: Secondary | ICD-10-CM

## 2014-12-14 DIAGNOSIS — O26613 Liver and biliary tract disorders in pregnancy, third trimester: Principal | ICD-10-CM

## 2014-12-14 DIAGNOSIS — O4402 Placenta previa specified as without hemorrhage, second trimester: Secondary | ICD-10-CM

## 2014-12-14 NOTE — Telephone Encounter (Signed)
Per Dr. Marice Potter, pt's has elevated glucose tolerance tests, needs 3 hour gtt.  Contacted patient with [facility] interpreter Id  [phone],  No answer, left message for patient to contact clinic.

## 2014-12-14 NOTE — Telephone Encounter (Signed)
[facility] [employer]preter [phone], attempted to contact patient, no answer, left message for patient to return call to the clinic.

## 2014-12-19 NOTE — Telephone Encounter (Signed)
Called patient with [facility] interpreter [phone] and husband answered stating she was at home and provided number of [phone]. Called there, no answer- left message stating we are trying to reach you with results, please call us back at the clinics

## 2014-12-20 NOTE — Telephone Encounter (Signed)
Attempted to contact patient with [facility] Interpreter ID [phone], pt is not available.  Spoke with spouse and gave instructions for 3 hour gtt on Monday 12/25/14 due to numerous attempts to contact patient without success.  Spouse verbalizes information.

## 2014-12-25 ENCOUNTER — Ambulatory Visit (INDEPENDENT_AMBULATORY_CARE_PROVIDER_SITE_OTHER): Payer: Medicaid Other | Admitting: Family Medicine

## 2014-12-25 VITALS — BP 130/80 | HR 94 | Wt 210.5 lb

## 2014-12-25 DIAGNOSIS — N898 Other specified noninflammatory disorders of vagina: Secondary | ICD-10-CM

## 2014-12-25 DIAGNOSIS — O0930 Supervision of pregnancy with insufficient antenatal care, unspecified trimester: Secondary | ICD-10-CM | POA: Diagnosis present

## 2014-12-25 DIAGNOSIS — O169 Unspecified maternal hypertension, unspecified trimester: Secondary | ICD-10-CM | POA: Insufficient documentation

## 2014-12-25 DIAGNOSIS — O163 Unspecified maternal hypertension, third trimester: Secondary | ICD-10-CM | POA: Diagnosis not present

## 2014-12-25 DIAGNOSIS — O360131 Maternal care for anti-D [Rh] antibodies, third trimester, fetus 1: Secondary | ICD-10-CM

## 2014-12-25 DIAGNOSIS — O26612 Liver and biliary tract disorders in pregnancy, second trimester: Secondary | ICD-10-CM | POA: Diagnosis not present

## 2014-12-25 DIAGNOSIS — K831 Obstruction of bile duct: Secondary | ICD-10-CM | POA: Diagnosis not present

## 2014-12-25 DIAGNOSIS — Z23 Encounter for immunization: Secondary | ICD-10-CM

## 2014-12-25 DIAGNOSIS — O26642 Intrahepatic cholestasis of pregnancy, second trimester: Secondary | ICD-10-CM

## 2014-12-25 LAB — POCT URINALYSIS DIP (DEVICE)
BILIRUBIN URINE: NEGATIVE
Glucose, UA: NEGATIVE mg/dL
KETONES UR: NEGATIVE mg/dL
Nitrite: NEGATIVE
Protein, ur: NEGATIVE mg/dL
SPECIFIC GRAVITY, URINE: 1.02 (ref 1.005–1.030)
Urobilinogen, UA: 0.2 mg/dL (ref 0.0–1.0)
pH: 6.5 (ref 5.0–8.0)

## 2014-12-25 LAB — GLUCOSE TOLERANCE, 3 HOURS
GLUCOSE 3 HOUR GTT: 117 mg/dL (ref 70–144)
GLUCOSE, 2 HOUR-GESTATIONAL: 189 mg/dL — AB (ref 70–164)
Glucose Tolerance, 1 hour: 226 mg/dL — ABNORMAL HIGH (ref 70–189)
Glucose Tolerance, Fasting: 113 mg/dL — ABNORMAL HIGH (ref 65–99)

## 2014-12-25 MED ORDER — LABETALOL HCL 200 MG PO TABS: 200.0000 mg | ORAL_TABLET | Freq: Two times a day (BID) | ORAL | Status: DC

## 2014-12-25 MED ORDER — TERCONAZOLE 0.4 % VA CREA: 1.0000 | TOPICAL_CREAM | Freq: Every day | VAGINAL | Status: DC

## 2014-12-25 MED ORDER — URSODIOL 300 MG PO CAPS: 300.0000 mg | ORAL_CAPSULE | Freq: Three times a day (TID) | ORAL | Status: DC

## 2014-12-25 NOTE — Patient Instructions (Signed)
Third Trimester of Pregnancy The third trimester is from week 29 through week 42, months 7 through 9. The third trimester is a time when the fetus is growing rapidly. At the end of the ninth month, the fetus is about 20 inches in length and weighs 6-10 pounds.  BODY CHANGES Your body goes through many changes during pregnancy. The changes vary from woman to woman.   Your weight will continue to increase. You can expect to gain 25-35 pounds (11-16 kg) by the end of the pregnancy.  You may begin to get stretch marks on your hips, abdomen, and breasts.  You may urinate more often because the fetus is moving lower into your pelvis and pressing on your bladder.  You may develop or continue to have heartburn as a result of your pregnancy.  You may develop constipation because certain hormones are causing the muscles that push waste through your intestines to slow down.  You may develop hemorrhoids or swollen, bulging veins (varicose veins).  You may have pelvic pain because of the weight gain and pregnancy hormones relaxing your joints between the bones in your pelvis. Backaches may result from overexertion of the muscles supporting your posture.  You may have changes in your hair. These can include thickening of your hair, rapid growth, and changes in texture. Some women also have hair loss during or after pregnancy, or hair that feels dry or thin. Your hair will most likely return to normal after your baby is born.  Your breasts will continue to grow and be tender. A yellow discharge may leak from your breasts called colostrum.  Your belly button may stick out.  You may feel short of breath because of your expanding uterus.  You may notice the fetus "dropping," or moving lower in your abdomen.  You may have a bloody mucus discharge. This usually occurs a few days to a week before labor begins.  Your cervix becomes thin and soft (effaced) near your due date. WHAT TO EXPECT AT YOUR  PRENATAL EXAMS  You will have prenatal exams every 2 weeks until week 36. Then, you will have weekly prenatal exams. During a routine prenatal visit:  You will be weighed to make sure you and the fetus are growing normally.  Your blood pressure is taken.  Your abdomen will be measured to track your baby's growth.  The fetal heartbeat will be listened to.  Any test results from the previous visit will be discussed.  You may have a cervical check near your due date to see if you have effaced. At around 36 weeks, your caregiver will check your cervix. At the same time, your caregiver will also perform a test on the secretions of the vaginal tissue. This test is to determine if a type of bacteria, Group B streptococcus, is present. Your caregiver will explain this further. Your caregiver may ask you:  What your birth plan is.  How you are feeling.  If you are feeling the baby move.  If you have had any abnormal symptoms, such as leaking fluid, bleeding, severe headaches, or abdominal cramping.  If you have any questions. Other tests or screenings that may be performed during your third trimester include:  Blood tests that check for low iron levels (anemia).  Fetal testing to check the health, activity level, and growth of the fetus. Testing is done if you have certain medical conditions or if there are problems during the pregnancy. FALSE LABOR You may feel small, irregular contractions that   eventually go away. These are called Braxton Hicks contractions, or false labor. Contractions may last for hours, days, or even weeks before true labor sets in. If contractions come at regular intervals, intensify, or become painful, it is best to be seen by your caregiver.  SIGNS OF LABOR   Menstrual-like cramps.  Contractions that are 5 minutes apart or less.  Contractions that start on the top of the uterus and spread down to the lower abdomen and back.  A sense of increased pelvic  pressure or back pain.  A watery or bloody mucus discharge that comes from the vagina. If you have any of these signs before the 37th week of pregnancy, call your caregiver right away. You need to go to the hospital to get checked immediately. HOME CARE INSTRUCTIONS   Avoid all smoking, herbs, alcohol, and unprescribed drugs. These chemicals affect the formation and growth of the baby.  Follow your caregiver's instructions regarding medicine use. There are medicines that are either safe or unsafe to take during pregnancy.  Exercise only as directed by your caregiver. Experiencing uterine cramps is a good sign to stop exercising.  Continue to eat regular, healthy meals.  Wear a good support bra for breast tenderness.  Do not use hot tubs, steam rooms, or saunas.  Wear your seat belt at all times when driving.  Avoid raw meat, uncooked cheese, cat litter boxes, and soil used by cats. These carry germs that can cause birth defects in the baby.  Take your prenatal vitamins.  Try taking a stool softener (if your caregiver approves) if you develop constipation. Eat more high-fiber foods, such as fresh vegetables or fruit and whole grains. Drink plenty of fluids to keep your urine clear or pale yellow.  Take warm sitz baths to soothe any pain or discomfort caused by hemorrhoids. Use hemorrhoid cream if your caregiver approves.  If you develop varicose veins, wear support hose. Elevate your feet for 15 minutes, 3-4 times a day. Limit salt in your diet.  Avoid heavy lifting, wear low heal shoes, and practice good posture.  Rest a lot with your legs elevated if you have leg cramps or low back pain.  Visit your dentist if you have not gone during your pregnancy. Use a soft toothbrush to brush your teeth and be gentle when you floss.  A sexual relationship may be continued unless your caregiver directs you otherwise.  Do not travel far distances unless it is absolutely necessary and only  with the approval of your caregiver.  Take prenatal classes to understand, practice, and ask questions about the labor and delivery.  Make a trial run to the hospital.  Pack your hospital bag.  Prepare the baby's nursery.  Continue to go to all your prenatal visits as directed by your caregiver. SEEK MEDICAL CARE IF:  You are unsure if you are in labor or if your water has broken.  You have dizziness.  You have mild pelvic cramps, pelvic pressure, or nagging pain in your abdominal area.  You have persistent nausea, vomiting, or diarrhea.  You have a bad smelling vaginal discharge.  You have pain with urination. SEEK IMMEDIATE MEDICAL CARE IF:   You have a fever.  You are leaking fluid from your vagina.  You have spotting or bleeding from your vagina.  You have severe abdominal cramping or pain.  You have rapid weight loss or gain.  You have shortness of breath with chest pain.  You notice sudden or extreme swelling   of your face, hands, ankles, feet, or legs.  You have not felt your baby move in over an hour.  You have severe headaches that do not go away with medicine.  You have vision changes. Document Released: 04/15/2001 Document Revised: 04/26/2013 Document Reviewed: 06/22/2012 [employer] Patient Information 2015 [employer]. This information is not intended to replace advice given to you by your health care provider. Make sure you discuss any questions you have with your health care provider.  Breastfeeding Deciding to breastfeed is one of the best choices you can make for you and your baby. A change in hormones during pregnancy causes your breast tissue to grow and increases the number and size of your milk ducts. These hormones also allow proteins, sugars, and fats from your blood supply to make breast milk in your milk-producing glands. Hormones prevent breast milk from being released before your baby is born as well as prompt milk flow after birth. Once  breastfeeding has begun, thoughts of your baby, as well as his or her sucking or crying, can stimulate the release of milk from your milk-producing glands.  BENEFITS OF BREASTFEEDING For Your Baby  Your first milk (colostrum) helps your baby's digestive system function better.   There are antibodies in your milk that help your baby fight off infections.   Your baby has a lower incidence of asthma, allergies, and sudden infant death syndrome.   The nutrients in breast milk are better for your baby than infant formulas and are designed uniquely for your baby's needs.   Breast milk improves your baby's brain development.   Your baby is less likely to develop other conditions, such as childhood obesity, asthma, or type 2 diabetes mellitus.  For You   Breastfeeding helps to create a very special bond between you and your baby.   Breastfeeding is convenient. Breast milk is always available at the correct temperature and costs nothing.   Breastfeeding helps to burn calories and helps you lose the weight gained during pregnancy.   Breastfeeding makes your uterus contract to its prepregnancy size faster and slows bleeding (lochia) after you give birth.   Breastfeeding helps to lower your risk of developing type 2 diabetes mellitus, osteoporosis, and breast or ovarian cancer later in life. SIGNS THAT YOUR BABY IS HUNGRY Early Signs of Hunger  Increased alertness or activity.  Stretching.  Movement of the head from side to side.  Movement of the head and opening of the mouth when the corner of the mouth or cheek is stroked (rooting).  Increased sucking sounds, smacking lips, cooing, sighing, or squeaking.  Hand-to-mouth movements.  Increased sucking of fingers or hands. Late Signs of Hunger  Fussing.  Intermittent crying. Extreme Signs of Hunger Signs of extreme hunger will require calming and consoling before your baby will be able to breastfeed successfully. Do not  wait for the following signs of extreme hunger to occur before you initiate breastfeeding:   Restlessness.  A loud, strong cry.   Screaming. BREASTFEEDING BASICS Breastfeeding Initiation  Find a comfortable place to sit or lie down, with your neck and back well supported.  Place a pillow or rolled up blanket under your baby to bring him or her to the level of your breast (if you are seated). Nursing pillows are specially designed to help support your arms and your baby while you breastfeed.  Make sure that your baby's abdomen is facing your abdomen.   Gently massage your breast. With your fingertips, massage from your chest   wall toward your nipple in a circular motion. This encourages milk flow. You may need to continue this action during the feeding if your milk flows slowly.  Support your breast with 4 fingers underneath and your thumb above your nipple. Make sure your fingers are well away from your nipple and your baby's mouth.   Stroke your baby's lips gently with your finger or nipple.   When your baby's mouth is open wide enough, quickly bring your baby to your breast, placing your entire nipple and as much of the colored area around your nipple (areola) as possible into your baby's mouth.   More areola should be visible above your baby's upper lip than below the lower lip.   Your baby's tongue should be between his or her lower gum and your breast.   Ensure that your baby's mouth is correctly positioned around your nipple (latched). Your baby's lips should create a seal on your breast and be turned out (everted).  It is common for your baby to suck about 2-3 minutes in order to start the flow of breast milk. Latching Teaching your baby how to latch on to your breast properly is very important. An improper latch can cause nipple pain and decreased milk supply for you and poor weight gain in your baby. Also, if your baby is not latched onto your nipple properly, he or she  may swallow some air during feeding. This can make your baby fussy. Burping your baby when you switch breasts during the feeding can help to get rid of the air. However, teaching your baby to latch on properly is still the best way to prevent fussiness from swallowing air while breastfeeding. Signs that your baby has successfully latched on to your nipple:    Silent tugging or silent sucking, without causing you pain.   Swallowing heard between every 3-4 sucks.    Muscle movement above and in front of his or her ears while sucking.  Signs that your baby has not successfully latched on to nipple:   Sucking sounds or smacking sounds from your baby while breastfeeding.  Nipple pain. If you think your baby has not latched on correctly, slip your finger into the corner of your baby's mouth to break the suction and place it between your baby's gums. Attempt breastfeeding initiation again. Signs of Successful Breastfeeding Signs from your baby:   A gradual decrease in the number of sucks or complete cessation of sucking.   Falling asleep.   Relaxation of his or her body.   Retention of a small amount of milk in his or her mouth.   Letting go of your breast by himself or herself. Signs from you:  Breasts that have increased in firmness, weight, and size 1-3 hours after feeding.   Breasts that are softer immediately after breastfeeding.  Increased milk volume, as well as a change in milk consistency and color by the fifth day of breastfeeding.   Nipples that are not sore, cracked, or bleeding. Signs That Your Baby is Getting Enough Milk  Wetting at least 3 diapers in a 24-hour period. The urine should be clear and pale yellow by age 5 days.  At least 3 stools in a 24-hour period by age 5 days. The stool should be soft and yellow.  At least 3 stools in a 24-hour period by age 7 days. The stool should be seedy and yellow.  No loss of weight greater than 10% of birth weight  during the first 3   days of age.  Average weight gain of 4-7 ounces (113-198 g) per week after age 4 days.  Consistent daily weight gain by age 5 days, without weight loss after the age of 2 weeks. After a feeding, your baby may spit up a small amount. This is common. BREASTFEEDING FREQUENCY AND DURATION Frequent feeding will help you make more milk and can prevent sore nipples and breast engorgement. Breastfeed when you feel the need to reduce the fullness of your breasts or when your baby shows signs of hunger. This is called "breastfeeding on demand." Avoid introducing a pacifier to your baby while you are working to establish breastfeeding (the first 4-6 weeks after your baby is born). After this time you may choose to use a pacifier. Research has shown that pacifier use during the first year of a baby's life decreases the risk of sudden infant death syndrome (SIDS). Allow your baby to feed on each breast as long as he or she wants. Breastfeed until your baby is finished feeding. When your baby unlatches or falls asleep while feeding from the first breast, offer the second breast. Because newborns are often sleepy in the first few weeks of life, you may need to awaken your baby to get him or her to feed. Breastfeeding times will vary from baby to baby. However, the following rules can serve as a guide to help you ensure that your baby is properly fed:  Newborns (babies 4 weeks of age or younger) may breastfeed every 1-3 hours.  Newborns should not go longer than 3 hours during the day or 5 hours during the night without breastfeeding.  You should breastfeed your baby a minimum of 8 times in a 24-hour period until you begin to introduce solid foods to your baby at around 6 months of age. BREAST MILK PUMPING Pumping and storing breast milk allows you to ensure that your baby is exclusively fed your breast milk, even at times when you are unable to breastfeed. This is especially important if you are  going back to work while you are still breastfeeding or when you are not able to be present during feedings. Your lactation consultant can give you guidelines on how long it is safe to store breast milk.  A breast pump is a machine that allows you to pump milk from your breast into a sterile bottle. The pumped breast milk can then be stored in a refrigerator or freezer. Some breast pumps are operated by hand, while others use electricity. Ask your lactation consultant which type will work best for you. Breast pumps can be purchased, but some hospitals and breastfeeding support groups lease breast pumps on a monthly basis. A lactation consultant can teach you how to hand express breast milk, if you prefer not to use a pump.  CARING FOR YOUR BREASTS WHILE YOU BREASTFEED Nipples can become dry, cracked, and sore while breastfeeding. The following recommendations can help keep your breasts moisturized and healthy:  Avoid using soap on your nipples.   Wear a supportive bra. Although not required, special nursing bras and tank tops are designed to allow access to your breasts for breastfeeding without taking off your entire bra or top. Avoid wearing underwire-style bras or extremely tight bras.  Air dry your nipples for 3-4minutes after each feeding.   Use only cotton bra pads to absorb leaked breast milk. Leaking of breast milk between feedings is normal.   Use lanolin on your nipples after breastfeeding. Lanolin helps to maintain your skin's   normal moisture barrier. If you use pure lanolin, you do not need to wash it off before feeding your baby again. Pure lanolin is not toxic to your baby. You may also hand express a few drops of breast milk and gently massage that milk into your nipples and allow the milk to air dry. In the first few weeks after giving birth, some women experience extremely full breasts (engorgement). Engorgement can make your breasts feel heavy, warm, and tender to the touch.  Engorgement peaks within 3-5 days after you give birth. The following recommendations can help ease engorgement:  Completely empty your breasts while breastfeeding or pumping. You may want to start by applying warm, moist heat (in the shower or with warm water-soaked hand towels) just before feeding or pumping. This increases circulation and helps the milk flow. If your baby does not completely empty your breasts while breastfeeding, pump any extra milk after he or she is finished.  Wear a snug bra (nursing or regular) or tank top for 1-2 days to signal your body to slightly decrease milk production.  Apply ice packs to your breasts, unless this is too uncomfortable for you.  Make sure that your baby is latched on and positioned properly while breastfeeding. If engorgement persists after 48 hours of following these recommendations, contact your health care provider or a lactation consultant. OVERALL HEALTH CARE RECOMMENDATIONS WHILE BREASTFEEDING  Eat healthy foods. Alternate between meals and snacks, eating 3 of each per day. Because what you eat affects your breast milk, some of the foods may make your baby more irritable than usual. Avoid eating these foods if you are sure that they are negatively affecting your baby.  Drink milk, fruit juice, and water to satisfy your thirst (about 10 glasses a day).   Rest often, relax, and continue to take your prenatal vitamins to prevent fatigue, stress, and anemia.  Continue breast self-awareness checks.  Avoid chewing and smoking tobacco.  Avoid alcohol and drug use. Some medicines that may be harmful to your baby can pass through breast milk. It is important to ask your health care provider before taking any medicine, including all over-the-counter and prescription medicine as well as vitamin and herbal supplements. It is possible to become pregnant while breastfeeding. If birth control is desired, ask your health care provider about options that  will be safe for your baby. SEEK MEDICAL CARE IF:   You feel like you want to stop breastfeeding or have become frustrated with breastfeeding.  You have painful breasts or nipples.  Your nipples are cracked or bleeding.  Your breasts are red, tender, or warm.  You have a swollen area on either breast.  You have a fever or chills.  You have nausea or vomiting.  You have drainage other than breast milk from your nipples.  Your breasts do not become full before feedings by the fifth day after you give birth.  You feel sad and depressed.  Your baby is too sleepy to eat well.  Your baby is having trouble sleeping.   Your baby is wetting less than 3 diapers in a 24-hour period.  Your baby has less than 3 stools in a 24-hour period.  Your baby's skin or the white part of his or her eyes becomes yellow.   Your baby is not gaining weight by 5 days of age. SEEK IMMEDIATE MEDICAL CARE IF:   Your baby is overly tired (lethargic) and does not want to wake up and feed.  Your baby   develops an unexplained fever. Document Released: 04/21/2005 Document Revised: 04/26/2013 Document Reviewed: 10/13/2012 [employer] Patient Information 2015 [employer]. This information is not intended to replace advice given to you by your health care provider. Make sure you discuss any questions you have with your health care provider.  

## 2014-12-25 NOTE — Addendum Note (Signed)
Addended by: Sherre Lain A on: 12/25/2014 11:53 AM   Modules accepted: Orders

## 2014-12-25 NOTE — Progress Notes (Signed)
Subjective:  Amanda Ellison is a 24 y.o. G1P0 at [redacted]w[redacted]d being seen today for ongoing prenatal care.  Patient reports vaginal irritation.  Contractions: Not present.  Vag. Bleeding: None. Movement: Present. Denies leaking of fluid.   The following portions of the patient's history were reviewed and updated as appropriate: allergies, current medications, past family history, past medical history, past social history, past surgical history and problem list.   Objective:   Filed Vitals:   12/25/14 0819  BP: 130/80  Pulse: 94  Weight: 210 lb 8 oz (95.482 kg)    Fetal Status: Fetal Heart Rate (bpm): 154 Fundal Height: 32 cm Movement: Present     General:  Alert, oriented and cooperative. Patient is in no acute distress.  Skin: Skin is warm and dry. No rash noted.   Cardiovascular: Normal heart rate noted  Respiratory: Normal respiratory effort, no problems with respiration noted  Abdomen: Soft, gravid, appropriate for gestational age. Pain/Pressure: Absent     Pelvic: Vag. Bleeding: None Vag D/C Character: White   Cervical exam deferred        Extremities: Normal range of motion.  Edema: None  Mental Status: Normal mood and affect. Normal behavior. Normal judgment and thought content.   Urinalysis: Urine Protein: Negative Urine Glucose: Negative  Assessment and Plan:  Pregnancy: G1P0 at [redacted]w[redacted]d  1. Late prenatal care affecting pregnancy Continue routine prenatal care. - Flu Vaccine QUAD 36+ mos IM; Standing   2. Rh negative, antepartum, third trimester, fetus 1 S/p Rhogam last visit  3. 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  Dispense: 90 capsule; Refill: 3  4. Hypertension in pregnancy, antepartum, third trimester - labetalol (NORMODYNE) 200 MG tablet; Take 1 tablet (200 mg total) by mouth 2 (two) times daily.  Dispense: 60 tablet; Refill: 3  5. Vaginal discharge - Wet  prep, genital - terconazole (TERAZOL 7) 0.4 % vaginal cream; Place 1 applicator vaginally at bedtime.  Dispense: 45 g; Refill: 0  Preterm labor symptoms and general obstetric precautions including but not limited to vaginal bleeding, contractions, leaking of fluid and fetal movement were reviewed in detail with the patient. Please refer to After Visit Summary for other counseling recommendations.  Return in 2 weeks (on 01/08/2015).   Reva Bores, MD

## 2014-12-26 ENCOUNTER — Encounter: Payer: Self-pay | Admitting: Family Medicine

## 2014-12-26 DIAGNOSIS — O24419 Gestational diabetes mellitus in pregnancy, unspecified control: Secondary | ICD-10-CM | POA: Insufficient documentation

## 2014-12-26 LAB — WET PREP, GENITAL: Trich, Wet Prep: NONE SEEN

## 2014-12-27 ENCOUNTER — Telehealth: Payer: Self-pay | Admitting: General Practice

## 2014-12-27 NOTE — Telephone Encounter (Signed)
Called patient with pacific interpreter [phone] to inform patient of GDM and need for diabetes education. No answer- left message stating we are trying to reach you with results, please call us back at the clinics

## 2014-12-28 NOTE — Telephone Encounter (Addendum)
Called patient with pacific interpreter [phone] at both listed numbers. No answer on either- left messages to call us back at the clinics for results  9/8  0930  Pt has arrived for scheduled prenatal visit today. New Dx : GDM will be discussed w/pt during visit.   Diane Day RNC

## 2015-01-11 ENCOUNTER — Telehealth: Payer: Self-pay | Admitting: *Deleted

## 2015-01-11 ENCOUNTER — Encounter (HOSPITAL_COMMUNITY): Payer: Self-pay

## 2015-01-11 ENCOUNTER — Ambulatory Visit (HOSPITAL_COMMUNITY)
Admission: RE | Admit: 2015-01-11 | Discharge: 2015-01-11 | Disposition: A | Discharge status: Home / Self Care | Payer: Medicaid Other | Source: Ambulatory Visit | Attending: Family Medicine | Admitting: Family Medicine

## 2015-01-11 ENCOUNTER — Ambulatory Visit (INDEPENDENT_AMBULATORY_CARE_PROVIDER_SITE_OTHER): Payer: Medicaid Other | Admitting: Obstetrics & Gynecology

## 2015-01-11 VITALS — BP 119/73 | HR 102 | Wt 213.5 lb

## 2015-01-11 VITALS — BP 142/75 | HR 90 | Wt 212.0 lb

## 2015-01-11 DIAGNOSIS — Z3A00 Weeks of gestation of pregnancy not specified: Secondary | ICD-10-CM | POA: Diagnosis not present

## 2015-01-11 DIAGNOSIS — O169 Unspecified maternal hypertension, unspecified trimester: Secondary | ICD-10-CM | POA: Diagnosis not present

## 2015-01-11 DIAGNOSIS — K831 Obstruction of bile duct: Secondary | ICD-10-CM | POA: Insufficient documentation

## 2015-01-11 DIAGNOSIS — O24419 Gestational diabetes mellitus in pregnancy, unspecified control: Secondary | ICD-10-CM | POA: Diagnosis not present

## 2015-01-11 DIAGNOSIS — O26619 Liver and biliary tract disorders in pregnancy, unspecified trimester: Secondary | ICD-10-CM | POA: Diagnosis present

## 2015-01-11 DIAGNOSIS — O0930 Supervision of pregnancy with insufficient antenatal care, unspecified trimester: Secondary | ICD-10-CM

## 2015-01-11 DIAGNOSIS — O26613 Liver and biliary tract disorders in pregnancy, third trimester: Secondary | ICD-10-CM

## 2015-01-11 DIAGNOSIS — O0933 Supervision of pregnancy with insufficient antenatal care, third trimester: Secondary | ICD-10-CM | POA: Diagnosis not present

## 2015-01-11 LAB — POCT URINALYSIS DIP (DEVICE)
Bilirubin Urine: NEGATIVE
Glucose, UA: 100 mg/dL — AB
Ketones, ur: NEGATIVE mg/dL
Nitrite: NEGATIVE
PROTEIN: NEGATIVE mg/dL
SPECIFIC GRAVITY, URINE: 1.015 (ref 1.005–1.030)
UROBILINOGEN UA: 0.2 mg/dL (ref 0.0–1.0)
pH: 6.5 (ref 5.0–8.0)

## 2015-01-11 MED ORDER — DIPHENHYDRAMINE HCL 50 MG PO TABS: 50.0000 mg | ORAL_TABLET | Freq: Every evening | ORAL | Status: DC | PRN

## 2015-01-11 MED ORDER — FLUCONAZOLE 150 MG PO TABS: 150.0000 mg | ORAL_TABLET | Freq: Once | ORAL | Status: DC

## 2015-01-11 NOTE — Patient Instructions (Addendum)
Third Trimester of Pregnancy The third trimester is from week 29 through week 42, months 7 through 9. The third trimester is a time when the fetus is growing rapidly. At the end of the ninth month, the fetus is about 20 inches in length and weighs 6-10 pounds.  BODY CHANGES Your body goes through many changes during pregnancy. The changes vary from woman to woman.   Your weight will continue to increase. You can expect to gain 25-35 pounds (11-16 kg) by the end of the pregnancy.  You may begin to get stretch marks on your hips, abdomen, and breasts.  You may urinate more often because the fetus is moving lower into your pelvis and pressing on your bladder.  You may develop or continue to have heartburn as a result of your pregnancy.  You may develop constipation because certain hormones are causing the muscles that push waste through your intestines to slow down.  You may develop hemorrhoids or swollen, bulging veins (varicose veins).  You may have pelvic pain because of the weight gain and pregnancy hormones relaxing your joints between the bones in your pelvis. Backaches may result from overexertion of the muscles supporting your posture.  You may have changes in your hair. These can include thickening of your hair, rapid growth, and changes in texture. Some women also have hair loss during or after pregnancy, or hair that feels dry or thin. Your hair will most likely return to normal after your baby is born.  Your breasts will continue to grow and be tender. A yellow discharge may leak from your breasts called colostrum.  Your belly button may stick out.  You may feel short of breath because of your expanding uterus.  You may notice the fetus "dropping," or moving lower in your abdomen.  You may have a bloody mucus discharge. This usually occurs a few days to a week before labor begins.  Your cervix becomes thin and soft (effaced) near your due date. WHAT TO EXPECT AT YOUR PRENATAL  EXAMS  You will have prenatal exams every 2 weeks until week 36. Then, you will have weekly prenatal exams. During a routine prenatal visit:  You will be weighed to make sure you and the fetus are growing normally.  Your blood pressure is taken.  Your abdomen will be measured to track your baby's growth.  The fetal heartbeat will be listened to.  Any test results from the previous visit will be discussed.  You may have a cervical check near your due date to see if you have effaced. At around 36 weeks, your caregiver will check your cervix. At the same time, your caregiver will also perform a test on the secretions of the vaginal tissue. This test is to determine if a type of bacteria, Group B streptococcus, is present. Your caregiver will explain this further. Your caregiver may ask you:  What your birth plan is.  How you are feeling.  If you are feeling the baby move.  If you have had any abnormal symptoms, such as leaking fluid, bleeding, severe headaches, or abdominal cramping.  If you have any questions. Other tests or screenings that may be performed during your third trimester include:  Blood tests that check for low iron levels (anemia).  Fetal testing to check the health, activity level, and growth of the fetus. Testing is done if you have certain medical conditions or if there are problems during the pregnancy. FALSE LABOR You may feel small, irregular contractions that   eventually go away. These are called Braxton Hicks contractions, or false labor. Contractions may last for hours, days, or even weeks before true labor sets in. If contractions come at regular intervals, intensify, or become painful, it is best to be seen by your caregiver.  SIGNS OF LABOR   Menstrual-like cramps.  Contractions that are 5 minutes apart or less.  Contractions that start on the top of the uterus and spread down to the lower abdomen and back.  A sense of increased pelvic pressure or back  pain.  A watery or bloody mucus discharge that comes from the vagina. If you have any of these signs before the 37th week of pregnancy, call your caregiver right away. You need to go to the hospital to get checked immediately. HOME CARE INSTRUCTIONS   Avoid all smoking, herbs, alcohol, and unprescribed drugs. These chemicals affect the formation and growth of the baby.  Follow your caregiver's instructions regarding medicine use. There are medicines that are either safe or unsafe to take during pregnancy.  Exercise only as directed by your caregiver. Experiencing uterine cramps is a good sign to stop exercising.  Continue to eat regular, healthy meals.  Wear a good support bra for breast tenderness.  Do not use hot tubs, steam rooms, or saunas.  Wear your seat belt at all times when driving.  Avoid raw meat, uncooked cheese, cat litter boxes, and soil used by cats. These carry germs that can cause birth defects in the baby.  Take your prenatal vitamins.  Try taking a stool softener (if your caregiver approves) if you develop constipation. Eat more high-fiber foods, such as fresh vegetables or fruit and whole grains. Drink plenty of fluids to keep your urine clear or pale yellow.  Take warm sitz baths to soothe any pain or discomfort caused by hemorrhoids. Use hemorrhoid cream if your caregiver approves.  If you develop varicose veins, wear support hose. Elevate your feet for 15 minutes, 3-4 times a day. Limit salt in your diet.  Avoid heavy lifting, wear low heal shoes, and practice good posture.  Rest a lot with your legs elevated if you have leg cramps or low back pain.  Visit your dentist if you have not gone during your pregnancy. Use a soft toothbrush to brush your teeth and be gentle when you floss.  A sexual relationship may be continued unless your caregiver directs you otherwise.  Do not travel far distances unless it is absolutely necessary and only with the approval  of your caregiver.  Take prenatal classes to understand, practice, and ask questions about the labor and delivery.  Make a trial run to the hospital.  Pack your hospital bag.  Prepare the baby's nursery.  Continue to go to all your prenatal visits as directed by your caregiver. SEEK MEDICAL CARE IF:  You are unsure if you are in labor or if your water has broken.  You have dizziness.  You have mild pelvic cramps, pelvic pressure, or nagging pain in your abdominal area.  You have persistent nausea, vomiting, or diarrhea.  You have a bad smelling vaginal discharge.  You have pain with urination. SEEK IMMEDIATE MEDICAL CARE IF:   You have a fever.  You are leaking fluid from your vagina.  You have spotting or bleeding from your vagina.  You have severe abdominal cramping or pain.  You have rapid weight loss or gain.  You have shortness of breath with chest pain.  You notice sudden or extreme swelling   of your face, hands, ankles, feet, or legs.  You have not felt your baby move in over an hour.  You have severe headaches that do not go away with medicine.  You have vision changes. Document Released: 04/15/2001 Document Revised: 04/26/2013 Document Reviewed: 06/22/2012 ExitCare Patient Information 2015 [employer]. This information is not intended to replace advice given to you by your health care provider. Make sure you discuss any questions you have with your health care provider.   Gestational Diabetes Mellitus Gestational diabetes mellitus, often simply referred to as gestational diabetes, is a type of diabetes that some women develop during pregnancy. In gestational diabetes, the pancreas does not make enough insulin (a hormone), the cells are less responsive to the insulin that is made (insulin resistance), or both.Normally, insulin moves sugars from food into the tissue cells. The tissue cells use the sugars for energy. The lack of insulin or the lack of  normal response to insulin causes excess sugars to build up in the blood instead of going into the tissue cells. As a result, high blood sugar (hyperglycemia) develops. The effect of high sugar (glucose) levels can cause many problems.  RISK FACTORS You have an increased chance of developing gestational diabetes if you have a family history of diabetes and also have one or more of the following risk factors:  A body mass index over 30 (obesity).  A previous pregnancy with gestational diabetes.  An older age at the time of pregnancy. If blood glucose levels are kept in the normal range during pregnancy, women can have a healthy pregnancy. If your blood glucose levels are not well controlled, there may be risks to you, your unborn baby (fetus), your labor and delivery, or your newborn baby.  SYMPTOMS  If symptoms are experienced, they are much like symptoms you would normally expect during pregnancy. The symptoms of gestational diabetes include:   Increased thirst (polydipsia).  Increased urination (polyuria).  Increased urination during the night (nocturia).  Weight loss. This weight loss may be rapid.  Frequent, recurring infections.  Tiredness (fatigue).  Weakness.  Vision changes, such as blurred vision.  Fruity smell to your breath.  Abdominal pain. DIAGNOSIS Diabetes is diagnosed when blood glucose levels are increased. Your blood glucose level may be checked by one or more of the following blood tests:  A fasting blood glucose test. You will not be allowed to eat for at least 8 hours before a blood sample is taken.  A random blood glucose test. Your blood glucose is checked at any time of the day regardless of when you ate.  A hemoglobin A1c blood glucose test. A hemoglobin A1c test provides information about blood glucose control over the previous 3 months.  An oral glucose tolerance test (OGTT). Your blood glucose is measured after you have not eaten (fasted) for 1-3  hours and then after you drink a glucose-containing beverage. Since the hormones that cause insulin resistance are highest at about 24-28 weeks of a pregnancy, an OGTT is usually performed during that time. If you have risk factors for gestational diabetes, your health care provider may test you for gestational diabetes earlier than 24 weeks of pregnancy. TREATMENT   You will need to take diabetes medicine or insulin daily to keep blood glucose levels in the desired range.  You will need to match insulin dosing with exercise and healthy food choices. The treatment goal is to maintain the before-meal (preprandial), bedtime, and overnight blood glucose level at 60-99 mg/dL during   pregnancy. The treatment goal is to further maintain peak after-meal blood sugar (postprandial glucose) level at 100-140 mg/dL. HOME CARE INSTRUCTIONS   Have your hemoglobin A1c level checked twice a year.  Perform daily blood glucose monitoring as directed by your health care provider. It is common to perform frequent blood glucose monitoring.  Monitor urine ketones when you are ill and as directed by your health care provider.  Take your diabetes medicine and insulin as directed by your health care provider to maintain your blood glucose level in the desired range.  Never run out of diabetes medicine or insulin. It is needed every day.  Adjust insulin based on your intake of carbohydrates. Carbohydrates can raise blood glucose levels but need to be included in your diet. Carbohydrates provide vitamins, minerals, and fiber which are an essential part of a healthy diet. Carbohydrates are found in fruits, vegetables, whole grains, dairy products, legumes, and foods containing added sugars.  Eat healthy foods. Alternate 3 meals with 3 snacks.  Maintain a healthy weight gain. The usual total expected weight gain varies according to your prepregnancy body mass index (BMI).  Carry a medical alert card or wear your medical  alert jewelry.  Carry a 15-gram carbohydrate snack with you at all times to treat low blood glucose (hypoglycemia). Some examples of 15-gram carbohydrate snacks include:  Glucose tablets, 3 or 4.  Glucose gel, 15-gram tube.  Raisins, 2 tablespoons (24 g).  Jelly beans, 6.  Animal crackers, 8.  Fruit juice, regular soda, or low-fat milk, 4 ounces (120 mL).  Gummy treats, 9.  Recognize hypoglycemia. Hypoglycemia during pregnancy occurs with blood glucose levels of 60 mg/dL and below. The risk for hypoglycemia increases when fasting or skipping meals, during or after intense exercise, and during sleep. Hypoglycemia symptoms can include:  Tremors or shakes.  Decreased ability to concentrate.  Sweating.  Increased heart rate.  Headache.  Dry mouth.  Hunger.  Irritability.  Anxiety.  Restless sleep.  Altered speech or coordination.  Confusion.  Treat hypoglycemia promptly. If you are alert and able to safely swallow, follow the 15:15 rule:  Take 15-20 grams of rapid-acting glucose or carbohydrate. Rapid-acting options include glucose gel, glucose tablets, or 4 ounces (120 mL) of fruit juice, regular soda, or low-fat milk.  Check your blood glucose level 15 minutes after taking the glucose.  Take 15-20 grams more of glucose if the repeat blood glucose level is still 70 mg/dL or below.  Eat a meal or snack within 1 hour once blood glucose levels return to normal.  Be alert to polyuria (excess urination) and polydipsia (excess thirst) which are early signs of hyperglycemia. An early awareness of hyperglycemia allows for prompt treatment. Treat hyperglycemia as directed by your health care provider.  Engage in at least 30 minutes of physical activity a day or as directed by your health care provider. Ten minutes of physical activity timed 30 minutes after each meal is encouraged to control postprandial blood glucose levels.  Adjust your insulin dosing and food intake  as needed if you start a new exercise or sport.  Follow your sick-day plan at any time you are unable to eat or drink as usual.  Avoid tobacco and alcohol use.  Keep all follow-up visits as directed by your health care provider.  Follow the advice of your health care provider regarding your prenatal and post-delivery (postpartum) appointments, meal planning, exercise, medicines, vitamins, blood tests, other medical tests, and physical activities.  Perform daily skin and   foot care. Examine your skin and feet daily for cuts, bruises, redness, nail problems, bleeding, blisters, or sores.  Brush your teeth and gums at least twice a day and floss at least once a day. Follow up with your dentist regularly.  Schedule an eye exam during the first trimester of your pregnancy or as directed by your health care provider.  Share your diabetes management plan with your workplace or school.  Stay up-to-date with immunizations.  Learn to manage stress.  Obtain ongoing diabetes education and support as needed.  Learn about and consider breastfeeding your baby.  You should have your blood sugar level checked 6-12 weeks after delivery. This is done with an oral glucose tolerance test (OGTT). SEEK MEDICAL CARE IF:   You are unable to eat food or drink fluids for more than 6 hours.  You have nausea and vomiting for more than 6 hours.  You have a blood glucose level of 200 mg/dL and you have ketones in your urine.  There is a change in mental status.  You develop vision problems.  You have a persistent headache.  You have upper abdominal pain or discomfort.  You develop an additional serious illness.  You have diarrhea for more than 6 hours.  You have been sick or have had a fever for a couple of days and are not getting better. SEEK IMMEDIATE MEDICAL CARE IF:   You have difficulty breathing.  You no longer feel the baby moving.  You are bleeding or have discharge from your  vagina.  You start having premature contractions or labor. MAKE SURE YOU:  Understand these instructions.  Will watch your condition.  Will get help right away if you are not doing well or get worse. Document Released: 07/28/2000 Document Revised: 09/05/2013 Document Reviewed: 11/18/2011 ExitCare Patient Information 2015 [employer]. This information is not intended to replace advice given to you by your health care provider. Make sure you discuss any questions you have with your health care provider.  

## 2015-01-11 NOTE — Progress Notes (Signed)
Pt reports white discharge and lots of itching. Pt informed of gestational diabetes diagnosis, advised that Dr. Debroah Loop will give her further recommendations.

## 2015-01-11 NOTE — Progress Notes (Signed)
Nutrition note: GDM diet education Pt was recently diagnosed with GDM. Pt has gained 50# @ 33w, which is > expected. Pt reports eating 3 meals/d. Pt reports she ran out of PNV and so has not been taking them recently. Pt reports no N&V or heartburn. NKFA. Pt reports no walking or physical activity.  Pt received verbal & written education in [place] via an interpreter about GDM diet. Encouraged PNV daily. Encouraged walking ~30 mins/d. Provided measuring cups to assist in proper CHO portion control. Discussed wt gain goals of 25-35# or 1#/wk. Pt agrees to follow GDM diet with 3 meals & 0-3 snacks/d with proper CHO/ protein combinations. Pt has WIC & plans to BF. F/u in 2-4 wks Blondell Reveal, MS, RD, LDN, [facility]

## 2015-01-11 NOTE — Progress Notes (Signed)
Subjective:  Amanda Ellison is a 24 y.o. G1P0 at [redacted]w[redacted]d being seen today for ongoing prenatal care.  Patient reports no complaints.  Contractions: Not present.  Vag. Bleeding: None. Movement: Present. Denies leaking of fluid.   The following portions of the patient's history were reviewed and updated as appropriate: allergies, current medications, past family history, past medical history, past social history, past surgical history and problem list.   Objective:   Filed Vitals:   01/11/15 0932  BP: 142/75  Pulse: 90  Weight: 212 lb (96.163 kg)    Fetal Status: Fetal Heart Rate (bpm): 159   Movement: Present     General:  Alert, oriented and cooperative. Patient is in no acute distress.  Skin: Skin is warm and dry. No rash noted.   Cardiovascular: Normal heart rate noted  Respiratory: Normal respiratory effort, no problems with respiration noted  Abdomen: Soft, gravid, appropriate for gestational age. Pain/Pressure: Absent     Pelvic: Vag. Bleeding: None Vag D/C Character: White  Wet prep done Cervical exam deferred        Extremities: Normal range of motion.  Edema: None  Mental Status: Normal mood and affect. Normal behavior. Normal judgment and thought content.   Urinalysis:      Assessment and Plan:  Pregnancy: G1P0 at [redacted]w[redacted]d  1. Gestational diabetes mellitus, antepartum Needs instructions and dietary counseling - diphenhydrAMINE (BENADRYL) 50 MG tablet; Take 1 tablet (50 mg total) by mouth at bedtime as needed for itching.  Dispense: 30 tablet; Refill: 3  2. Late prenatal care affecting pregnancy Cholestasis of preg - diphenhydrAMINE (BENADRYL) 50 MG tablet; Take 1 tablet (50 mg total) by mouth at bedtime as needed for itching.  Dispense: 30 tablet; Refill: 3 Actigall Preterm labor symptoms and general obstetric precautions including but not limited to vaginal bleeding, contractions, leaking of fluid and fetal movement were reviewed in detail with the patient. Please  refer to After Visit Summary for other counseling recommendations.  Return in about 4 days (around 01/15/2015). Diflucan for vaginal yeast  Adam Phenix, MD

## 2015-01-11 NOTE — Telephone Encounter (Signed)
Called pt with [employer] [phone].  I left a message stating that the doctor wants her to be seen on 9/12.  Waiting until 9/19 may put her baby at further risk for complications due to the Gestational Diabetes. I have scheduled an appointment on 9/12 @ 0945. This is very important.  Please call back and leave a message stating if you will keep this appt.  **This appt is necessary so that pt can receive instruction for self testing of blood sugar. This needs to be done BEFORE 9/19 so that we can evaluate her glucose control ON 9/19. If pt calls back and absolutely cannot come to clinic on 9/12, the Diabetes Educator Harriett Sine) also has appts available on 9/15 @ [facility] at the following times :  1330, 1430, 1530.  If pt agrees to one of those appt times, will need to schedule at [facility].  Pt will still need appt on 9/19 as scheduled.

## 2015-01-12 LAB — WET PREP, GENITAL
CLUE CELLS WET PREP: NONE SEEN
Trich, Wet Prep: NONE SEEN

## 2015-01-15 ENCOUNTER — Encounter: Payer: Medicaid Other | Attending: Obstetrics and Gynecology | Admitting: *Deleted

## 2015-01-15 ENCOUNTER — Other Ambulatory Visit: Payer: Medicaid Other

## 2015-01-15 DIAGNOSIS — O24419 Gestational diabetes mellitus in pregnancy, unspecified control: Secondary | ICD-10-CM | POA: Diagnosis present

## 2015-01-15 DIAGNOSIS — Z713 Dietary counseling and surveillance: Secondary | ICD-10-CM | POA: Diagnosis not present

## 2015-01-15 MED ORDER — ACCU-CHEK FASTCLIX LANCETS MISC: 1.0000 | Freq: Four times a day (QID) | Status: DC

## 2015-01-15 MED ORDER — GLUCOSE BLOOD VI STRP: ORAL_STRIP | Status: DC

## 2015-01-15 NOTE — Telephone Encounter (Signed)
Pt seen in clinic today.  

## 2015-01-15 NOTE — Progress Notes (Signed)
  Patient was seen on 01/15/15 for Gestational Diabetes self-management .Patient arrived early for her appointment. Although an interpreter had been scheduled, the patient arrived before the scheduled time. We utilized [employer] [phone] for [employer]. Throughout the visit the patient verbalized that she understood. She left my office and phoned her husband who noted that she did not understand anything the interpreter said. I met again with the patient along with her [place] speaking husband. They both verbalized understanding following this meeting. I gave them another booklet "Nutrition, Diabetes & Pregnancy".   The following learning objectives were met by the patient :   States the definition of Gestational Diabetes  States when to check blood glucose levels  Demonstrates proper blood glucose monitoring techniques  States the effect of stress and exercise on blood glucose levels  Plan:  Always have a moderate amount of protein with each meal and snack Begin reading food labels for Total Carbohydrate and sugar grams of foods Consider  increasing your activity level by walking daily as tolerated Begin checking BG before breakfast and 2 hours after first bit of breakfast, lunch and dinner after  as directed by MD  Take medication  as directed by MD  Blood glucose monitor given: Accu-chek Lot # [id number] Exp: 10/03/15 Blood glucose reading: 147m/dl 60 mins pp  Patient instructed to monitor glucose levels: FBS: 60 - <90 1 hour: <140 2 hour: <120  Patient received the following handouts:  Nutrition Diabetes and Pregnancy  Carbohydrate Counting List  Meal Planning worksheet  Patient will be seen for follow-up as needed.

## 2015-01-22 ENCOUNTER — Ambulatory Visit (INDEPENDENT_AMBULATORY_CARE_PROVIDER_SITE_OTHER): Payer: Medicaid Other | Admitting: Obstetrics and Gynecology

## 2015-01-22 ENCOUNTER — Encounter: Payer: Self-pay | Admitting: Obstetrics and Gynecology

## 2015-01-22 VITALS — BP 137/73 | HR 82 | Wt 209.3 lb

## 2015-01-22 DIAGNOSIS — O99019 Anemia complicating pregnancy, unspecified trimester: Secondary | ICD-10-CM

## 2015-01-22 DIAGNOSIS — O360131 Maternal care for anti-D [Rh] antibodies, third trimester, fetus 1: Secondary | ICD-10-CM

## 2015-01-22 DIAGNOSIS — O26613 Liver and biliary tract disorders in pregnancy, third trimester: Secondary | ICD-10-CM | POA: Diagnosis not present

## 2015-01-22 DIAGNOSIS — O0933 Supervision of pregnancy with insufficient antenatal care, third trimester: Secondary | ICD-10-CM | POA: Diagnosis not present

## 2015-01-22 DIAGNOSIS — O99013 Anemia complicating pregnancy, third trimester: Secondary | ICD-10-CM | POA: Diagnosis not present

## 2015-01-22 DIAGNOSIS — O0930 Supervision of pregnancy with insufficient antenatal care, unspecified trimester: Secondary | ICD-10-CM

## 2015-01-22 DIAGNOSIS — O163 Unspecified maternal hypertension, third trimester: Secondary | ICD-10-CM

## 2015-01-22 DIAGNOSIS — K831 Obstruction of bile duct: Secondary | ICD-10-CM

## 2015-01-22 DIAGNOSIS — O24419 Gestational diabetes mellitus in pregnancy, unspecified control: Secondary | ICD-10-CM | POA: Diagnosis not present

## 2015-01-22 DIAGNOSIS — O26612 Liver and biliary tract disorders in pregnancy, second trimester: Secondary | ICD-10-CM

## 2015-01-22 LAB — POCT URINALYSIS DIP (DEVICE)
Glucose, UA: 100 mg/dL — AB
Hgb urine dipstick: NEGATIVE
KETONES UR: NEGATIVE mg/dL
Nitrite: NEGATIVE
PH: 7 (ref 5.0–8.0)
PROTEIN: 30 mg/dL — AB
Specific Gravity, Urine: 1.025 (ref 1.005–1.030)
Urobilinogen, UA: 1 mg/dL (ref 0.0–1.0)

## 2015-01-22 MED ORDER — URSODIOL 300 MG PO CAPS: 300.0000 mg | ORAL_CAPSULE | Freq: Three times a day (TID) | ORAL | Status: DC

## 2015-01-22 NOTE — Progress Notes (Signed)
Subjective:  Amanda Ellison is a 24 y.o. G1P0 at [redacted]w[redacted]d being seen today for ongoing prenatal care.  Patient reports no complaints.  Contractions: Not present.  Vag. Bleeding: None. Movement: Present. Denies leaking of fluid.   The following portions of the patient's history were reviewed and updated as appropriate: allergies, current medications, past family history, past medical history, past social history, past surgical history and problem list.   Objective:   Filed Vitals:   01/22/15 0808  BP: 137/73  Pulse: 82  Weight: 209 lb 4.8 oz (94.938 kg)    Fetal Status: Fetal Heart Rate (bpm): 144   Movement: Present     General:  Alert, oriented and cooperative. Patient is in no acute distress.  Skin: Skin is warm and dry. No rash noted.   Cardiovascular: Normal heart rate noted  Respiratory: Normal respiratory effort, no problems with respiration noted  Abdomen: Soft, gravid, appropriate for gestational age. Pain/Pressure: Absent     Pelvic: Vag. Bleeding: None     Cervical exam deferred        Extremities: Normal range of motion.  Edema: None  Mental Status: Normal mood and affect. Normal behavior. Normal judgment and thought content.   Urinalysis: Urine Protein: Trace Urine Glucose: Negative  Assessment and Plan:  Pregnancy: G1P0 at [redacted]w[redacted]d  1. Rh negative, antepartum, third trimester, fetus 1 S/p rhogham  2. Late prenatal care affecting pregnancy   3. Hypertension in pregnancy, antepartum, third trimester - BP well controlled  - Continue labetalol  4. Gestational diabetes mellitus, antepartum CBGs reviewed and majority within range. 2 abnormal fasting values of 93 and 104  5. Anemia affecting pregnancy, antepartum Continue ferrous sulfate  6. 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   Dispense: 90 capsule; Refill: 3 - [place] MFM FETAL BPP WO NON STRESS; Future - Fetal nonstress test- reviewed and reactive  Preterm labor symptoms and general obstetric precautions including but not limited to vaginal bleeding, contractions, leaking of fluid and fetal movement were reviewed in detail with the patient. Please refer to After Visit Summary for other counseling recommendations.  No Follow-up on file.   Catalina Antigua, MD

## 2015-01-26 ENCOUNTER — Ambulatory Visit (INDEPENDENT_AMBULATORY_CARE_PROVIDER_SITE_OTHER): Payer: Medicaid Other | Admitting: *Deleted

## 2015-01-26 VITALS — BP 121/74 | HR 79

## 2015-01-26 DIAGNOSIS — K831 Obstruction of bile duct: Secondary | ICD-10-CM

## 2015-01-26 DIAGNOSIS — O163 Unspecified maternal hypertension, third trimester: Secondary | ICD-10-CM | POA: Diagnosis not present

## 2015-01-26 DIAGNOSIS — O26613 Liver and biliary tract disorders in pregnancy, third trimester: Secondary | ICD-10-CM

## 2015-01-26 NOTE — Progress Notes (Signed)
Interpreter Hadizatou Sofo present for encounter.   

## 2015-01-26 NOTE — Progress Notes (Signed)
NST reactive.

## 2015-01-29 ENCOUNTER — Ambulatory Visit (INDEPENDENT_AMBULATORY_CARE_PROVIDER_SITE_OTHER): Payer: Medicaid Other | Admitting: Family Medicine

## 2015-01-29 ENCOUNTER — Other Ambulatory Visit (HOSPITAL_COMMUNITY)
Admission: RE | Admit: 2015-01-29 | Discharge: 2015-01-29 | Disposition: A | Discharge status: Home / Self Care | Payer: Medicaid Other | Source: Ambulatory Visit | Attending: Family Medicine | Admitting: Family Medicine

## 2015-01-29 VITALS — BP 133/77 | HR 73 | Temp 98.2°F | Wt 205.8 lb

## 2015-01-29 DIAGNOSIS — O26613 Liver and biliary tract disorders in pregnancy, third trimester: Secondary | ICD-10-CM

## 2015-01-29 DIAGNOSIS — Z113 Encounter for screening for infections with a predominantly sexual mode of transmission: Secondary | ICD-10-CM | POA: Insufficient documentation

## 2015-01-29 DIAGNOSIS — K831 Obstruction of bile duct: Secondary | ICD-10-CM | POA: Diagnosis not present

## 2015-01-29 DIAGNOSIS — Z118 Encounter for screening for other infectious and parasitic diseases: Secondary | ICD-10-CM | POA: Diagnosis not present

## 2015-01-29 DIAGNOSIS — O24419 Gestational diabetes mellitus in pregnancy, unspecified control: Secondary | ICD-10-CM | POA: Diagnosis not present

## 2015-01-29 DIAGNOSIS — O0930 Supervision of pregnancy with insufficient antenatal care, unspecified trimester: Secondary | ICD-10-CM

## 2015-01-29 DIAGNOSIS — O163 Unspecified maternal hypertension, third trimester: Secondary | ICD-10-CM | POA: Diagnosis not present

## 2015-01-29 DIAGNOSIS — O0933 Supervision of pregnancy with insufficient antenatal care, third trimester: Secondary | ICD-10-CM

## 2015-01-29 DIAGNOSIS — O26612 Liver and biliary tract disorders in pregnancy, second trimester: Secondary | ICD-10-CM

## 2015-01-29 LAB — POCT URINALYSIS DIP (DEVICE)
Bilirubin Urine: NEGATIVE
Glucose, UA: NEGATIVE mg/dL
HGB URINE DIPSTICK: NEGATIVE
Ketones, ur: NEGATIVE mg/dL
NITRITE: NEGATIVE
PH: 7 (ref 5.0–8.0)
PROTEIN: NEGATIVE mg/dL
Specific Gravity, Urine: 1.02 (ref 1.005–1.030)
UROBILINOGEN UA: 1 mg/dL (ref 0.0–1.0)

## 2015-01-29 LAB — OB RESULTS CONSOLE GBS: GBS: POSITIVE

## 2015-01-29 NOTE — Patient Instructions (Signed)
Gestational Diabetes Mellitus Gestational diabetes mellitus, often simply referred to as gestational diabetes, is a type of diabetes that some women develop during pregnancy. In gestational diabetes, the pancreas does not make enough insulin (a hormone), the cells are less responsive to the insulin that is made (insulin resistance), or both.Normally, insulin moves sugars from food into the tissue cells. The tissue cells use the sugars for energy. The lack of insulin or the lack of normal response to insulin causes excess sugars to build up in the blood instead of going into the tissue cells. As a result, high blood sugar (hyperglycemia) develops. The effect of high sugar (glucose) levels can cause many problems.  RISK FACTORS You have an increased chance of developing gestational diabetes if you have a family history of diabetes and also have one or more of the following risk factors:  A body mass index over 30 (obesity).  A previous pregnancy with gestational diabetes.  An older age at the time of pregnancy. If blood glucose levels are kept in the normal range during pregnancy, women can have a healthy pregnancy. If your blood glucose levels are not well controlled, there may be risks to you, your unborn baby (fetus), your labor and delivery, or your newborn baby.  SYMPTOMS  If symptoms are experienced, they are much like symptoms you would normally expect during pregnancy. The symptoms of gestational diabetes include:   Increased thirst (polydipsia).  Increased urination (polyuria).  Increased urination during the night (nocturia).  Weight loss. This weight loss may be rapid.  Frequent, recurring infections.  Tiredness (fatigue).  Weakness.  Vision changes, such as blurred vision.  Fruity smell to your breath.  Abdominal pain. DIAGNOSIS Diabetes is diagnosed when blood glucose levels are increased. Your blood glucose level may be checked by one or more of the following blood  tests:  A fasting blood glucose test. You will not be allowed to eat for at least 8 hours before a blood sample is taken.  A random blood glucose test. Your blood glucose is checked at any time of the day regardless of when you ate.  A hemoglobin A1c blood glucose test. A hemoglobin A1c test provides information about blood glucose control over the previous 3 months.  An oral glucose tolerance test (OGTT). Your blood glucose is measured after you have not eaten (fasted) for 1-3 hours and then after you drink a glucose-containing beverage. Since the hormones that cause insulin resistance are highest at about 24-28 weeks of a pregnancy, an OGTT is usually performed during that time. If you have risk factors for gestational diabetes, your health care Sevannah Madia may test you for gestational diabetes earlier than 24 weeks of pregnancy. TREATMENT   You will need to take diabetes medicine or insulin daily to keep blood glucose levels in the desired range.  You will need to match insulin dosing with exercise and healthy food choices. The treatment goal is to maintain the before-meal (preprandial), bedtime, and overnight blood glucose level at 60-99 mg/dL during pregnancy. The treatment goal is to further maintain peak after-meal blood sugar (postprandial glucose) level at 100-140 mg/dL. HOME CARE INSTRUCTIONS   Have your hemoglobin A1c level checked twice a year.  Perform daily blood glucose monitoring as directed by your health care Jacy Howat. It is common to perform frequent blood glucose monitoring.  Monitor urine ketones when you are ill and as directed by your health care Reagen Haberman.  Take your diabetes medicine and insulin as directed by your health care Denver Bentson   to maintain your blood glucose level in the desired range.  Never run out of diabetes medicine or insulin. It is needed every day.  Adjust insulin based on your intake of carbohydrates. Carbohydrates can raise blood glucose levels but  need to be included in your diet. Carbohydrates provide vitamins, minerals, and fiber which are an essential part of a healthy diet. Carbohydrates are found in fruits, vegetables, whole grains, dairy products, legumes, and foods containing added sugars.  Eat healthy foods. Alternate 3 meals with 3 snacks.  Maintain a healthy weight gain. The usual total expected weight gain varies according to your prepregnancy body mass index (BMI).  Carry a medical alert card or wear your medical alert jewelry.  Carry a 15-gram carbohydrate snack with you at all times to treat low blood glucose (hypoglycemia). Some examples of 15-gram carbohydrate snacks include:  Glucose tablets, 3 or 4.  Glucose gel, 15-gram tube.  Raisins, 2 tablespoons (24 g).  Jelly beans, 6.  Animal crackers, 8.  Fruit juice, regular soda, or low-fat milk, 4 ounces (120 mL).  Gummy treats, 9.  Recognize hypoglycemia. Hypoglycemia during pregnancy occurs with blood glucose levels of 60 mg/dL and below. The risk for hypoglycemia increases when fasting or skipping meals, during or after intense exercise, and during sleep. Hypoglycemia symptoms can include:  Tremors or shakes.  Decreased ability to concentrate.  Sweating.  Increased heart rate.  Headache.  Dry mouth.  Hunger.  Irritability.  Anxiety.  Restless sleep.  Altered speech or coordination.  Confusion.  Treat hypoglycemia promptly. If you are alert and able to safely swallow, follow the 15:15 rule:  Take 15-20 grams of rapid-acting glucose or carbohydrate. Rapid-acting options include glucose gel, glucose tablets, or 4 ounces (120 mL) of fruit juice, regular soda, or low-fat milk.  Check your blood glucose level 15 minutes after taking the glucose.  Take 15-20 grams more of glucose if the repeat blood glucose level is still 70 mg/dL or below.  Eat a meal or snack within 1 hour once blood glucose levels return to normal.  Be alert to polyuria  (excess urination) and polydipsia (excess thirst) which are early signs of hyperglycemia. An early awareness of hyperglycemia allows for prompt treatment. Treat hyperglycemia as directed by your health care Catlin Doria.  Engage in at least 30 minutes of physical activity a day or as directed by your health care Zackory Pudlo. Ten minutes of physical activity timed 30 minutes after each meal is encouraged to control postprandial blood glucose levels.  Adjust your insulin dosing and food intake as needed if you start a new exercise or sport.  Follow your sick-day plan at any time you are unable to eat or drink as usual.  Avoid tobacco and alcohol use.  Keep all follow-up visits as directed by your health care Dontrey Snellgrove.  Follow the advice of your health care Gianluca Chhim regarding your prenatal and post-delivery (postpartum) appointments, meal planning, exercise, medicines, vitamins, blood tests, other medical tests, and physical activities.  Perform daily skin and foot care. Examine your skin and feet daily for cuts, bruises, redness, nail problems, bleeding, blisters, or sores.  Brush your teeth and gums at least twice a day and floss at least once a day. Follow up with your dentist regularly.  Schedule an eye exam during the first trimester of your pregnancy or as directed by your health care Roni Friberg.  Share your diabetes management plan with your workplace or school.  Stay up-to-date with immunizations.  Learn to manage stress.    Obtain ongoing diabetes education and support as needed.  Learn about and consider breastfeeding your baby.  You should have your blood sugar level checked 6-12 weeks after delivery. This is done with an oral glucose tolerance test (OGTT). SEEK MEDICAL CARE IF:   You are unable to eat food or drink fluids for more than 6 hours.  You have nausea and vomiting for more than 6 hours.  You have a blood glucose level of 200 mg/dL and you have ketones in your  urine.  There is a change in mental status.  You develop vision problems.  You have a persistent headache.  You have upper abdominal pain or discomfort.  You develop an additional serious illness.  You have diarrhea for more than 6 hours.  You have been sick or have had a fever for a couple of days and are not getting better. SEEK IMMEDIATE MEDICAL CARE IF:   You have difficulty breathing.  You no longer feel the baby moving.  You are bleeding or have discharge from your vagina.  You start having premature contractions or labor. MAKE SURE YOU:  Understand these instructions.  Will watch your condition.  Will get help right away if you are not doing well or get worse. Document Released: 07/28/2000 Document Revised: 09/05/2013 Document Reviewed: 11/18/2011 [employer] Patient Information 2015 [employer]. This information is not intended to replace advice given to you by your health care Avyaan Summer. Make sure you discuss any questions you have with your health care Marley Charlot.  Breastfeeding Deciding to breastfeed is one of the best choices you can make for you and your baby. A change in hormones during pregnancy causes your breast tissue to grow and increases the number and size of your milk ducts. These hormones also allow proteins, sugars, and fats from your blood supply to make breast milk in your milk-producing glands. Hormones prevent breast milk from being released before your baby is born as well as prompt milk flow after birth. Once breastfeeding has begun, thoughts of your baby, as well as his or her sucking or crying, can stimulate the release of milk from your milk-producing glands.  BENEFITS OF BREASTFEEDING For Your Baby  Your first milk (colostrum) helps your baby's digestive system function better.   There are antibodies in your milk that help your baby fight off infections.   Your baby has a lower incidence of asthma, allergies, and sudden infant death  syndrome.   The nutrients in breast milk are better for your baby than infant formulas and are designed uniquely for your baby's needs.   Breast milk improves your baby's brain development.   Your baby is less likely to develop other conditions, such as childhood obesity, asthma, or type 2 diabetes mellitus.  For You   Breastfeeding helps to create a very special bond between you and your baby.   Breastfeeding is convenient. Breast milk is always available at the correct temperature and costs nothing.   Breastfeeding helps to burn calories and helps you lose the weight gained during pregnancy.   Breastfeeding makes your uterus contract to its prepregnancy size faster and slows bleeding (lochia) after you give birth.   Breastfeeding helps to lower your risk of developing type 2 diabetes mellitus, osteoporosis, and breast or ovarian cancer later in life. SIGNS THAT YOUR BABY IS HUNGRY Early Signs of Hunger  Increased alertness or activity.  Stretching.  Movement of the head from side to side.  Movement of the head and opening of the   mouth when the corner of the mouth or cheek is stroked (rooting).  Increased sucking sounds, smacking lips, cooing, sighing, or squeaking.  Hand-to-mouth movements.  Increased sucking of fingers or hands. Late Signs of Hunger  Fussing.  Intermittent crying. Extreme Signs of Hunger Signs of extreme hunger will require calming and consoling before your baby will be able to breastfeed successfully. Do not wait for the following signs of extreme hunger to occur before you initiate breastfeeding:   Restlessness.  A loud, strong cry.   Screaming. BREASTFEEDING BASICS Breastfeeding Initiation  Find a comfortable place to sit or lie down, with your neck and back well supported.  Place a pillow or rolled up blanket under your baby to bring him or her to the level of your breast (if you are seated). Nursing pillows are specially designed  to help support your arms and your baby while you breastfeed.  Make sure that your baby's abdomen is facing your abdomen.   Gently massage your breast. With your fingertips, massage from your chest wall toward your nipple in a circular motion. This encourages milk flow. You may need to continue this action during the feeding if your milk flows slowly.  Support your breast with 4 fingers underneath and your thumb above your nipple. Make sure your fingers are well away from your nipple and your baby's mouth.   Stroke your baby's lips gently with your finger or nipple.   When your baby's mouth is open wide enough, quickly bring your baby to your breast, placing your entire nipple and as much of the colored area around your nipple (areola) as possible into your baby's mouth.   More areola should be visible above your baby's upper lip than below the lower lip.   Your baby's tongue should be between his or her lower gum and your breast.   Ensure that your baby's mouth is correctly positioned around your nipple (latched). Your baby's lips should create a seal on your breast and be turned out (everted).  It is common for your baby to suck about 2-3 minutes in order to start the flow of breast milk. Latching Teaching your baby how to latch on to your breast properly is very important. An improper latch can cause nipple pain and decreased milk supply for you and poor weight gain in your baby. Also, if your baby is not latched onto your nipple properly, he or she may swallow some air during feeding. This can make your baby fussy. Burping your baby when you switch breasts during the feeding can help to get rid of the air. However, teaching your baby to latch on properly is still the best way to prevent fussiness from swallowing air while breastfeeding. Signs that your baby has successfully latched on to your nipple:    Silent tugging or silent sucking, without causing you pain.   Swallowing  heard between every 3-4 sucks.    Muscle movement above and in front of his or her ears while sucking.  Signs that your baby has not successfully latched on to nipple:   Sucking sounds or smacking sounds from your baby while breastfeeding.  Nipple pain. If you think your baby has not latched on correctly, slip your finger into the corner of your baby's mouth to break the suction and place it between your baby's gums. Attempt breastfeeding initiation again. Signs of Successful Breastfeeding Signs from your baby:   A gradual decrease in the number of sucks or complete cessation of sucking.     Falling asleep.   Relaxation of his or her body.   Retention of a small amount of milk in his or her mouth.   Letting go of your breast by himself or herself. Signs from you:  Breasts that have increased in firmness, weight, and size 1-3 hours after feeding.   Breasts that are softer immediately after breastfeeding.  Increased milk volume, as well as a change in milk consistency and color by the fifth day of breastfeeding.   Nipples that are not sore, cracked, or bleeding. Signs That Your Baby is Getting Enough Milk  Wetting at least 3 diapers in a 24-hour period. The urine should be clear and pale yellow by age 5 days.  At least 3 stools in a 24-hour period by age 5 days. The stool should be soft and yellow.  At least 3 stools in a 24-hour period by age 7 days. The stool should be seedy and yellow.  No loss of weight greater than 10% of birth weight during the first 3 days of age.  Average weight gain of 4-7 ounces (113-198 g) per week after age 4 days.  Consistent daily weight gain by age 5 days, without weight loss after the age of 2 weeks. After a feeding, your baby may spit up a small amount. This is common. BREASTFEEDING FREQUENCY AND DURATION Frequent feeding will help you make more milk and can prevent sore nipples and breast engorgement. Breastfeed when you feel the  need to reduce the fullness of your breasts or when your baby shows signs of hunger. This is called "breastfeeding on demand." Avoid introducing a pacifier to your baby while you are working to establish breastfeeding (the first 4-6 weeks after your baby is born). After this time you may choose to use a pacifier. Research has shown that pacifier use during the first year of a baby's life decreases the risk of sudden infant death syndrome (SIDS). Allow your baby to feed on each breast as long as he or she wants. Breastfeed until your baby is finished feeding. When your baby unlatches or falls asleep while feeding from the first breast, offer the second breast. Because newborns are often sleepy in the first few weeks of life, you may need to awaken your baby to get him or her to feed. Breastfeeding times will vary from baby to baby. However, the following rules can serve as a guide to help you ensure that your baby is properly fed:  Newborns (babies 4 weeks of age or younger) may breastfeed every 1-3 hours.  Newborns should not go longer than 3 hours during the day or 5 hours during the night without breastfeeding.  You should breastfeed your baby a minimum of 8 times in a 24-hour period until you begin to introduce solid foods to your baby at around 6 months of age. BREAST MILK PUMPING Pumping and storing breast milk allows you to ensure that your baby is exclusively fed your breast milk, even at times when you are unable to breastfeed. This is especially important if you are going back to work while you are still breastfeeding or when you are not able to be present during feedings. Your lactation consultant can give you guidelines on how long it is safe to store breast milk.  A breast pump is a machine that allows you to pump milk from your breast into a sterile bottle. The pumped breast milk can then be stored in a refrigerator or freezer. Some breast pumps are operated by   hand, while others use  electricity. Ask your lactation consultant which type will work best for you. Breast pumps can be purchased, but some hospitals and breastfeeding support groups lease breast pumps on a monthly basis. A lactation consultant can teach you how to hand express breast milk, if you prefer not to use a pump.  CARING FOR YOUR BREASTS WHILE YOU BREASTFEED Nipples can become dry, cracked, and sore while breastfeeding. The following recommendations can help keep your breasts moisturized and healthy:  Avoid using soap on your nipples.   Wear a supportive bra. Although not required, special nursing bras and tank tops are designed to allow access to your breasts for breastfeeding without taking off your entire bra or top. Avoid wearing underwire-style bras or extremely tight bras.  Air dry your nipples for 3-4minutes after each feeding.   Use only cotton bra pads to absorb leaked breast milk. Leaking of breast milk between feedings is normal.   Use lanolin on your nipples after breastfeeding. Lanolin helps to maintain your skin's normal moisture barrier. If you use pure lanolin, you do not need to wash it off before feeding your baby again. Pure lanolin is not toxic to your baby. You may also hand express a few drops of breast milk and gently massage that milk into your nipples and allow the milk to air dry. In the first few weeks after giving birth, some women experience extremely full breasts (engorgement). Engorgement can make your breasts feel heavy, warm, and tender to the touch. Engorgement peaks within 3-5 days after you give birth. The following recommendations can help ease engorgement:  Completely empty your breasts while breastfeeding or pumping. You may want to start by applying warm, moist heat (in the shower or with warm water-soaked hand towels) just before feeding or pumping. This increases circulation and helps the milk flow. If your baby does not completely empty your breasts while  breastfeeding, pump any extra milk after he or she is finished.  Wear a snug bra (nursing or regular) or tank top for 1-2 days to signal your body to slightly decrease milk production.  Apply ice packs to your breasts, unless this is too uncomfortable for you.  Make sure that your baby is latched on and positioned properly while breastfeeding. If engorgement persists after 48 hours of following these recommendations, contact your health care Mckinzy Fuller or a lactation consultant. OVERALL HEALTH CARE RECOMMENDATIONS WHILE BREASTFEEDING  Eat healthy foods. Alternate between meals and snacks, eating 3 of each per day. Because what you eat affects your breast milk, some of the foods may make your baby more irritable than usual. Avoid eating these foods if you are sure that they are negatively affecting your baby.  Drink milk, fruit juice, and water to satisfy your thirst (about 10 glasses a day).   Rest often, relax, and continue to take your prenatal vitamins to prevent fatigue, stress, and anemia.  Continue breast self-awareness checks.  Avoid chewing and smoking tobacco.  Avoid alcohol and drug use. Some medicines that may be harmful to your baby can pass through breast milk. It is important to ask your health care Jamont Mellin before taking any medicine, including all over-the-counter and prescription medicine as well as vitamin and herbal supplements. It is possible to become pregnant while breastfeeding. If birth control is desired, ask your health care Bravery Ketcham about options that will be safe for your baby. SEEK MEDICAL CARE IF:   You feel like you want to stop breastfeeding or have become   frustrated with breastfeeding.  You have painful breasts or nipples.  Your nipples are cracked or bleeding.  Your breasts are red, tender, or warm.  You have a swollen area on either breast.  You have a fever or chills.  You have nausea or vomiting.  You have drainage other than breast milk from  your nipples.  Your breasts do not become full before feedings by the fifth day after you give birth.  You feel sad and depressed.  Your baby is too sleepy to eat well.  Your baby is having trouble sleeping.   Your baby is wetting less than 3 diapers in a 24-hour period.  Your baby has less than 3 stools in a 24-hour period.  Your baby's skin or the white part of his or her eyes becomes yellow.   Your baby is not gaining weight by 5 days of age. SEEK IMMEDIATE MEDICAL CARE IF:   Your baby is overly tired (lethargic) and does not want to wake up and feed.  Your baby develops an unexplained fever. Document Released: 04/21/2005 Document Revised: 04/26/2013 Document Reviewed: 10/13/2012 [employer] Patient Information 2015 [employer]. This information is not intended to replace advice given to you by your health care Semya Klinke. Make sure you discuss any questions you have with your health care Zolton Dowson.  

## 2015-01-29 NOTE — Progress Notes (Signed)
Amanda Ellison used for interpreter Reviewed tip of week with patient  IOL scheduled 10/6

## 2015-01-29 NOTE — Progress Notes (Signed)
Subjective:  Amanda Ellison is a 24 y.o. G1P0 at [redacted]w[redacted]d being seen today for ongoing prenatal care.  Patient reports no complaints.  Contractions: Irritability.  Vag. Bleeding: None. Movement: Present. Denies leaking of fluid.   The following portions of the patient's history were reviewed and updated as appropriate: allergies, current medications, past family history, past medical history, past social history, past surgical history and problem list.   Objective:   Filed Vitals:   01/29/15 1013  BP: 133/77  Pulse: 73  Temp: 98.2 F (36.8 C)  Weight: 205 lb 12.8 oz (93.35 kg)    Fetal Status: Fetal Heart Rate (bpm): 145 Fundal Height: 37 cm Movement: Present  Presentation: Vertex  General:  Alert, oriented and cooperative. Patient is in no acute distress.  Skin: Skin is warm and dry. No rash noted.   Cardiovascular: Normal heart rate noted  Respiratory: Normal respiratory effort, no problems with respiration noted  Abdomen: Soft, gravid, appropriate for gestational age. Pain/Pressure: Absent     Pelvic: Vag. Bleeding: None Vag D/C Character: White   Cervical exam performed Dilation: Closed Effacement (%): 50 Station: -3  Extremities: Normal range of motion.  Edema: None  Mental Status: Normal mood and affect. Normal behavior. Normal judgment and thought content.   Urinalysis: Urine Protein: Negative Urine Glucose: Negative FBS 76-102 (3 out of range) 2 our pp 91-151 (5 of 21 out of range) NST reviewed and reactive.  Assessment and Plan:  Pregnancy: G1P0 at [redacted]w[redacted]d  1. Late prenatal care affecting pregnancy Continue routine prenatal care. - Culture, beta strep (group b only) - GC/Chlamydia Probe Amp  2. Hypertension in pregnancy, antepartum, third trimester Continue Labetalol--BP well controlled  3. 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 Gestational diabetes mellitus, antepartum BS as above.  Continue Glyburide  Term labor symptoms and general obstetric precautions including but not limited to vaginal bleeding, contractions, leaking of fluid and fetal movement were reviewed in detail with the patient. Please refer to After Visit Summary for other counseling recommendations.  Return in 1 week (on 02/05/2015) for [facility], OB visit and NST, NST only in 4 days.   Reva Bores, MD

## 2015-01-30 LAB — URINE CYTOLOGY ANCILLARY ONLY
Chlamydia: NEGATIVE
Neisseria Gonorrhea: NEGATIVE

## 2015-01-31 ENCOUNTER — Encounter: Payer: Self-pay | Admitting: Family Medicine

## 2015-01-31 DIAGNOSIS — O99820 Streptococcus B carrier state complicating pregnancy: Secondary | ICD-10-CM | POA: Insufficient documentation

## 2015-01-31 LAB — CULTURE, BETA STREP (GROUP B ONLY)

## 2015-02-01 ENCOUNTER — Ambulatory Visit (INDEPENDENT_AMBULATORY_CARE_PROVIDER_SITE_OTHER): Payer: Medicaid Other | Admitting: Obstetrics & Gynecology

## 2015-02-01 VITALS — BP 129/77 | HR 72 | Wt 207.7 lb

## 2015-02-01 DIAGNOSIS — O26612 Liver and biliary tract disorders in pregnancy, second trimester: Secondary | ICD-10-CM

## 2015-02-01 DIAGNOSIS — O24419 Gestational diabetes mellitus in pregnancy, unspecified control: Secondary | ICD-10-CM | POA: Diagnosis not present

## 2015-02-01 DIAGNOSIS — O163 Unspecified maternal hypertension, third trimester: Secondary | ICD-10-CM

## 2015-02-01 DIAGNOSIS — K831 Obstruction of bile duct: Secondary | ICD-10-CM | POA: Diagnosis not present

## 2015-02-05 ENCOUNTER — Ambulatory Visit (INDEPENDENT_AMBULATORY_CARE_PROVIDER_SITE_OTHER): Payer: Medicaid Other | Admitting: Obstetrics and Gynecology

## 2015-02-05 ENCOUNTER — Encounter: Payer: Self-pay | Admitting: Obstetrics and Gynecology

## 2015-02-05 VITALS — BP 130/67 | HR 89 | Wt 204.9 lb

## 2015-02-05 DIAGNOSIS — O99820 Streptococcus B carrier state complicating pregnancy: Secondary | ICD-10-CM

## 2015-02-05 DIAGNOSIS — Z22330 Carrier of Group B streptococcus: Secondary | ICD-10-CM

## 2015-02-05 DIAGNOSIS — O24419 Gestational diabetes mellitus in pregnancy, unspecified control: Secondary | ICD-10-CM

## 2015-02-05 DIAGNOSIS — O360131 Maternal care for anti-D [Rh] antibodies, third trimester, fetus 1: Secondary | ICD-10-CM

## 2015-02-05 DIAGNOSIS — O0930 Supervision of pregnancy with insufficient antenatal care, unspecified trimester: Secondary | ICD-10-CM

## 2015-02-05 DIAGNOSIS — O163 Unspecified maternal hypertension, third trimester: Secondary | ICD-10-CM | POA: Diagnosis not present

## 2015-02-05 DIAGNOSIS — O99019 Anemia complicating pregnancy, unspecified trimester: Secondary | ICD-10-CM

## 2015-02-05 DIAGNOSIS — K831 Obstruction of bile duct: Secondary | ICD-10-CM

## 2015-02-05 DIAGNOSIS — O26612 Liver and biliary tract disorders in pregnancy, second trimester: Secondary | ICD-10-CM

## 2015-02-05 LAB — POCT URINALYSIS DIP (DEVICE)
BILIRUBIN URINE: NEGATIVE
Glucose, UA: 100 mg/dL — AB
HGB URINE DIPSTICK: NEGATIVE
Ketones, ur: NEGATIVE mg/dL
Nitrite: NEGATIVE
Protein, ur: NEGATIVE mg/dL
SPECIFIC GRAVITY, URINE: 1.01 (ref 1.005–1.030)
Urobilinogen, UA: 0.2 mg/dL (ref 0.0–1.0)
pH: 6.5 (ref 5.0–8.0)

## 2015-02-05 NOTE — Progress Notes (Signed)
Moderate leukocytes noted on urinalysis. 

## 2015-02-05 NOTE — Progress Notes (Signed)
Subjective:  Amanda Ellison is a 24 y.o. G1P0 at [redacted]w[redacted]d being seen today for ongoing prenatal care.  Patient reports no complaints.  She describes pain along the lower ribs bilaterally. Contractions: Not present.  Vag. Bleeding: None. Movement: Present. Denies leaking of fluid.   The following portions of the patient's history were reviewed and updated as appropriate: allergies, current medications, past family history, past medical history, past social history, past surgical history and problem list.   Objective:   Filed Vitals:   02/05/15 0818  BP: 130/67  Pulse: 89  Weight: 204 lb 14.4 oz (92.942 kg)    Fetal Status:     Movement: Present     General:  Alert, oriented and cooperative. Patient is in no acute distress.  Skin: Skin is warm and dry. No rash noted.   Cardiovascular: Normal heart rate noted  Respiratory: Normal respiratory effort, no problems with respiration noted  Abdomen: Soft, gravid, appropriate for gestational age. Pain/Pressure: Present     Pelvic: Vag. Bleeding: None Vag D/C Character: White   Cervical exam deferred        Extremities: Normal range of motion.  Edema: None  Mental Status: Normal mood and affect. Normal behavior. Normal judgment and thought content.   Urinalysis:      Assessment and Plan:  Pregnancy: G1P0 at [redacted]w[redacted]d  1. Hypertension in pregnancy, antepartum, third trimester BP normal today. Patient denies si/sx of preeclampsia - Fetal nonstress test- reviewed and reactive  2. Gestational diabetes mellitus, antepartum CBGs reviewed and all within range except for a fasting of 151 which was not a true fasting as patient ate in the middle of the night - Fetal nonstress test; Future - Fetal nonstress test  3. Late prenatal care affecting pregnancy   4. Rh negative, antepartum, third trimester, fetus 1 S/p rhogam  5. 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 Anemia affecting  pregnancy, antepartum   7. Group B Streptococcus carrier, +RV culture, currently pregnant Prophylaxis in labor  Term labor symptoms and general obstetric precautions including but not limited to vaginal bleeding, contractions, leaking of fluid and fetal movement were reviewed in detail with the patient. Please refer to After Visit Summary for other counseling recommendations.  No Follow-up on file.   Catalina Antigua, MD

## 2015-02-05 NOTE — Progress Notes (Signed)
Used [employer] [phone].c/o pain in epigastric area.

## 2015-02-06 ENCOUNTER — Ambulatory Visit (HOSPITAL_COMMUNITY)
Admission: RE | Admit: 2015-02-06 | Discharge: 2015-02-06 | Disposition: A | Discharge status: Home / Self Care | Payer: Medicaid Other | Source: Ambulatory Visit | Attending: Family Medicine | Admitting: Family Medicine

## 2015-02-06 ENCOUNTER — Encounter (HOSPITAL_COMMUNITY): Payer: Self-pay

## 2015-02-06 DIAGNOSIS — O26643 Intrahepatic cholestasis of pregnancy, third trimester: Secondary | ICD-10-CM

## 2015-02-06 DIAGNOSIS — O169 Unspecified maternal hypertension, unspecified trimester: Secondary | ICD-10-CM

## 2015-02-06 DIAGNOSIS — O26613 Liver and biliary tract disorders in pregnancy, third trimester: Secondary | ICD-10-CM | POA: Diagnosis present

## 2015-02-06 DIAGNOSIS — O163 Unspecified maternal hypertension, third trimester: Secondary | ICD-10-CM | POA: Insufficient documentation

## 2015-02-06 DIAGNOSIS — K831 Obstruction of bile duct: Secondary | ICD-10-CM | POA: Diagnosis not present

## 2015-02-06 HISTORY — DX: Gestational diabetes mellitus in pregnancy, unspecified control: O24.419

## 2015-02-06 HISTORY — DX: Essential (primary) hypertension: I10

## 2015-02-07 ENCOUNTER — Telehealth (HOSPITAL_COMMUNITY): Payer: Self-pay | Admitting: *Deleted

## 2015-02-07 ENCOUNTER — Encounter (HOSPITAL_COMMUNITY): Payer: Self-pay | Admitting: *Deleted

## 2015-02-07 NOTE — Telephone Encounter (Signed)
Preadmission screen Interpreter number [phone] changed to interpreter number [email or web address].  Pt was asking questions frequently until husband entered phone conversation.  He then said they had no further questions and they would be here tomorrow morning.  She had stated she did not understand why she was being induced tomorrow.  I was unable to continue the conversation due to husband ending conversation.

## 2015-02-08 ENCOUNTER — Inpatient Hospital Stay (HOSPITAL_COMMUNITY)
Admission: RE | Admit: 2015-02-08 | Discharge: 2015-02-11 | DRG: 774 | Disposition: A | Discharge status: Home / Self Care | Payer: Medicaid Other | Source: Ambulatory Visit | Attending: Family Medicine | Admitting: Family Medicine

## 2015-02-08 ENCOUNTER — Encounter (HOSPITAL_COMMUNITY): Payer: Self-pay

## 2015-02-08 ENCOUNTER — Ambulatory Visit (HOSPITAL_COMMUNITY): Payer: Medicaid Other

## 2015-02-08 VITALS — BP 132/68 | HR 67 | Temp 97.7°F | Resp 18 | Ht 71.0 in | Wt 204.0 lb

## 2015-02-08 DIAGNOSIS — O9962 Diseases of the digestive system complicating childbirth: Secondary | ICD-10-CM | POA: Diagnosis not present

## 2015-02-08 DIAGNOSIS — Z6791 Unspecified blood type, Rh negative: Secondary | ICD-10-CM | POA: Diagnosis present

## 2015-02-08 DIAGNOSIS — Z3A37 37 weeks gestation of pregnancy: Secondary | ICD-10-CM

## 2015-02-08 DIAGNOSIS — O99824 Streptococcus B carrier state complicating childbirth: Secondary | ICD-10-CM | POA: Diagnosis present

## 2015-02-08 DIAGNOSIS — O0930 Supervision of pregnancy with insufficient antenatal care, unspecified trimester: Secondary | ICD-10-CM

## 2015-02-08 DIAGNOSIS — K831 Obstruction of bile duct: Secondary | ICD-10-CM | POA: Diagnosis present

## 2015-02-08 DIAGNOSIS — O26899 Other specified pregnancy related conditions, unspecified trimester: Secondary | ICD-10-CM

## 2015-02-08 DIAGNOSIS — Z7982 Long term (current) use of aspirin: Secondary | ICD-10-CM | POA: Diagnosis not present

## 2015-02-08 DIAGNOSIS — O26613 Liver and biliary tract disorders in pregnancy, third trimester: Secondary | ICD-10-CM | POA: Diagnosis not present

## 2015-02-08 DIAGNOSIS — O26612 Liver and biliary tract disorders in pregnancy, second trimester: Secondary | ICD-10-CM

## 2015-02-08 DIAGNOSIS — O2662 Liver and biliary tract disorders in childbirth: Principal | ICD-10-CM | POA: Diagnosis present

## 2015-02-08 DIAGNOSIS — O163 Unspecified maternal hypertension, third trimester: Secondary | ICD-10-CM

## 2015-02-08 DIAGNOSIS — Z843 Family history of consanguinity: Secondary | ICD-10-CM

## 2015-02-08 DIAGNOSIS — O360131 Maternal care for anti-D [Rh] antibodies, third trimester, fetus 1: Secondary | ICD-10-CM

## 2015-02-08 DIAGNOSIS — O1002 Pre-existing essential hypertension complicating childbirth: Secondary | ICD-10-CM | POA: Diagnosis present

## 2015-02-08 DIAGNOSIS — O24410 Gestational diabetes mellitus in pregnancy, diet controlled: Secondary | ICD-10-CM | POA: Diagnosis present

## 2015-02-08 DIAGNOSIS — O1092 Unspecified pre-existing hypertension complicating childbirth: Secondary | ICD-10-CM | POA: Diagnosis not present

## 2015-02-08 DIAGNOSIS — O24419 Gestational diabetes mellitus in pregnancy, unspecified control: Secondary | ICD-10-CM | POA: Diagnosis present

## 2015-02-08 DIAGNOSIS — O26649 Intrahepatic cholestasis of pregnancy, unspecified trimester: Secondary | ICD-10-CM | POA: Diagnosis present

## 2015-02-08 DIAGNOSIS — O26642 Intrahepatic cholestasis of pregnancy, second trimester: Secondary | ICD-10-CM | POA: Diagnosis present

## 2015-02-08 DIAGNOSIS — O99820 Streptococcus B carrier state complicating pregnancy: Secondary | ICD-10-CM

## 2015-02-08 DIAGNOSIS — O169 Unspecified maternal hypertension, unspecified trimester: Secondary | ICD-10-CM | POA: Diagnosis present

## 2015-02-08 DIAGNOSIS — O26619 Liver and biliary tract disorders in pregnancy, unspecified trimester: Secondary | ICD-10-CM

## 2015-02-08 DIAGNOSIS — O99019 Anemia complicating pregnancy, unspecified trimester: Secondary | ICD-10-CM | POA: Diagnosis present

## 2015-02-08 LAB — CBC
HCT: 34 % — ABNORMAL LOW (ref 36.0–46.0)
Hemoglobin: 10.2 g/dL — ABNORMAL LOW (ref 12.0–15.0)
MCH: 20.5 pg — ABNORMAL LOW (ref 26.0–34.0)
MCHC: 30 g/dL (ref 30.0–36.0)
MCV: 68.4 fL — ABNORMAL LOW (ref 78.0–100.0)
Platelets: 230 10*3/uL (ref 150–400)
RBC: 4.97 MIL/uL (ref 3.87–5.11)
RDW: 17.9 % — ABNORMAL HIGH (ref 11.5–15.5)
WBC: 6.2 10*3/uL (ref 4.0–10.5)

## 2015-02-08 LAB — ABO/RH: ABO/RH(D): O NEG

## 2015-02-08 LAB — RPR: RPR Ser Ql: NONREACTIVE

## 2015-02-08 LAB — TYPE AND SCREEN
ABO/RH(D): O NEG
ANTIBODY SCREEN: NEGATIVE

## 2015-02-08 LAB — GLUCOSE, CAPILLARY: Glucose-Capillary: 80 mg/dL (ref 65–99)

## 2015-02-08 MED ORDER — EPHEDRINE 5 MG/ML INJ: 10.0000 mg | INTRAVENOUS | Status: DC | PRN

## 2015-02-08 MED ORDER — PENICILLIN G POTASSIUM 5000000 UNITS IJ SOLR
5.0000 10*6.[IU] | Freq: Once | INTRAMUSCULAR | Status: AC
  Administered 2015-02-08: 5 10*6.[IU] via INTRAVENOUS
  Filled 2015-02-08: qty 5

## 2015-02-08 MED ORDER — LABETALOL HCL 200 MG PO TABS
200.0000 mg | ORAL_TABLET | Freq: Two times a day (BID) | ORAL | Status: DC
  Filled 2015-02-08: qty 1

## 2015-02-08 MED ORDER — LACTATED RINGERS IV SOLN: 500.0000 mL | INTRAVENOUS | Status: DC | PRN

## 2015-02-08 MED ORDER — PHENYLEPHRINE 40 MCG/ML (10ML) SYRINGE FOR IV PUSH (FOR BLOOD PRESSURE SUPPORT)
80.0000 ug | PREFILLED_SYRINGE | INTRAVENOUS | Status: DC | PRN
  Filled 2015-02-08: qty 20

## 2015-02-08 MED ORDER — FENTANYL CITRATE (PF) 100 MCG/2ML IJ SOLN
100.0000 ug | INTRAMUSCULAR | Status: DC | PRN
  Administered 2015-02-08 (×2): 100 ug via INTRAVENOUS
  Filled 2015-02-08 (×2): qty 2

## 2015-02-08 MED ORDER — OXYTOCIN 40 UNITS IN LACTATED RINGERS INFUSION - SIMPLE MED
1.0000 m[IU]/min | INTRAVENOUS | Status: DC
  Administered 2015-02-08: 2 m[IU]/min via INTRAVENOUS
  Administered 2015-02-08: 4 m[IU]/min via INTRAVENOUS

## 2015-02-08 MED ORDER — LIDOCAINE HCL (PF) 1 % IJ SOLN
30.0000 mL | INTRAMUSCULAR | Status: DC | PRN
  Administered 2015-02-09: 30 mL via SUBCUTANEOUS
  Filled 2015-02-08: qty 30

## 2015-02-08 MED ORDER — OXYTOCIN 40 UNITS IN LACTATED RINGERS INFUSION - SIMPLE MED
62.5000 mL/h | INTRAVENOUS | Status: DC
  Filled 2015-02-08: qty 1000

## 2015-02-08 MED ORDER — OXYTOCIN BOLUS FROM INFUSION
500.0000 mL | INTRAVENOUS | Status: DC
  Administered 2015-02-09: 500 mL via INTRAVENOUS

## 2015-02-08 MED ORDER — ACETAMINOPHEN 325 MG PO TABS: 650.0000 mg | ORAL_TABLET | ORAL | Status: DC | PRN

## 2015-02-08 MED ORDER — MISOPROSTOL 25 MCG QUARTER TABLET
25.0000 ug | ORAL_TABLET | ORAL | Status: DC
  Administered 2015-02-08 (×2): 25 ug via VAGINAL
  Filled 2015-02-08 (×2): qty 0.25

## 2015-02-08 MED ORDER — LACTATED RINGERS IV SOLN
INTRAVENOUS | Status: DC
  Administered 2015-02-08 (×2): 1000 mL via INTRAVENOUS
  Administered 2015-02-09: 04:00:00 via INTRAVENOUS

## 2015-02-08 MED ORDER — URSODIOL 300 MG PO CAPS
300.0000 mg | ORAL_CAPSULE | Freq: Three times a day (TID) | ORAL | Status: DC
  Administered 2015-02-08 – 2015-02-09 (×3): 300 mg via ORAL
  Filled 2015-02-08 (×7): qty 1

## 2015-02-08 MED ORDER — TERBUTALINE SULFATE 1 MG/ML IJ SOLN: 0.2500 mg | Freq: Once | INTRAMUSCULAR | Status: DC | PRN

## 2015-02-08 MED ORDER — ONDANSETRON HCL 4 MG/2ML IJ SOLN: 4.0000 mg | Freq: Four times a day (QID) | INTRAMUSCULAR | Status: DC | PRN

## 2015-02-08 MED ORDER — FENTANYL 2.5 MCG/ML BUPIVACAINE 1/10 % EPIDURAL INFUSION (WH - ANES)
14.0000 mL/h | INTRAMUSCULAR | Status: DC | PRN
  Administered 2015-02-09: 14 mL/h via EPIDURAL
  Administered 2015-02-09: 15 mL/h via EPIDURAL
  Filled 2015-02-08 (×2): qty 125

## 2015-02-08 MED ORDER — OXYCODONE-ACETAMINOPHEN 5-325 MG PO TABS: 1.0000 | ORAL_TABLET | ORAL | Status: DC | PRN

## 2015-02-08 MED ORDER — CITRIC ACID-SODIUM CITRATE 334-500 MG/5ML PO SOLN: 30.0000 mL | ORAL | Status: DC | PRN

## 2015-02-08 MED ORDER — ZOLPIDEM TARTRATE 5 MG PO TABS: 5.0000 mg | ORAL_TABLET | Freq: Every evening | ORAL | Status: DC | PRN

## 2015-02-08 MED ORDER — PENICILLIN G POTASSIUM 5000000 UNITS IJ SOLR
2.5000 10*6.[IU] | INTRAVENOUS | Status: DC
  Administered 2015-02-09 (×3): 2.5 10*6.[IU] via INTRAVENOUS
  Filled 2015-02-08 (×7): qty 2.5

## 2015-02-08 MED ORDER — OXYCODONE-ACETAMINOPHEN 5-325 MG PO TABS: 2.0000 | ORAL_TABLET | ORAL | Status: DC | PRN

## 2015-02-08 MED ORDER — FLEET ENEMA 7-19 GM/118ML RE ENEM: 1.0000 | ENEMA | RECTAL | Status: DC | PRN

## 2015-02-08 MED ORDER — DIPHENHYDRAMINE HCL 50 MG/ML IJ SOLN: 12.5000 mg | INTRAMUSCULAR | Status: DC | PRN

## 2015-02-08 NOTE — Plan of Care (Signed)
Problem: Consults Goal: Birthing Suites Patient Information Press F2 to bring up selections list Outcome: Completed/Met Date Met:  02/08/15  Pt 37-[redacted] weeks EGA, Inpatient induction, Diabetic and Non-English Speaking, choestasis, consanguinity

## 2015-02-08 NOTE — Progress Notes (Signed)
Labor Progress Note Amanda Ellison is a 24 y.o. G1P0 at [redacted]w[redacted]d presented for IOL for cholestasis (Bile acids 27). Pregnancy also complicated by cHTN, A2GDM  S: Feeling ctx, needed fentanyl x1   O:  BP 135/86 mmHg  Pulse 65  Temp(Src) 98.7 F (37.1 C) (Oral)  Resp 18  Ht  (1.803 m)  Wt 204 lb (92.534 kg)  BMI 28.46 kg/m2  LMP 05/25/2014 EFM: 130/mod/+accels  CVE: Dilation: 2 Effacement (%): 50 Cervical Position: Middle, Anterior Station: -2 Presentation: Vertex Exam by:: Newtom MD   A&P: 24 y.o. G1P0 [redacted]w[redacted]d here for IOL 2/2 to cholestasis  #Labor: progressing well. S/p cytotec. Foley bulb placed easily #Pain: prn IV meds #FWB: Cat I #GBS POS, pcn when >5 cm or ROM, orders in signed/held for RN relase.   Federico Flake, MD 4:38 PM

## 2015-02-08 NOTE — H&P (Signed)
Chief Complaint:  IOL for Cholestasis of pregnancy   HPI: Amanda Ellison is a 24 y.o. G1P0 at [redacted]w[redacted]d who presents for IOL 2/2 Cholestasis of pregnancy. Endorses some itching but this is better than it had been previously. Denies contractions, leakage of fluid or vaginal bleeding. Good fetal movement.   Pregnancy Course:  Clinic  [facility] Prenatal Labs  Dating  LMP consistent with 13 wk ultrasound Blood type:   O-  Genetic Screen 1 Screen: NT normal   AFP:     Quad:     NIPS: Antibody: neg  Anatomic [place]  18 wk nml anatomy; posterior previa noted Rubella:  IMM  GTT   Third trimester: 178 RPR:   NR  Flu vaccine 12/25/14 HBsAg:   neg  TDaP vaccine      12/04/14                                         Rhogam:  12/04/14 HIV:   neg  GBS   Positive                               GBS: pos  Contraception  Pap:  Baby Food  Breast   Circumcision    Pediatrician  Given list 10/19/14   Support Person  Kililou (husband)      Past Medical History: Past Medical History  Diagnosis Date  . Gestational diabetes   . Hypertension   . Cholelithiasis affecting pregnancy, antepartum     Past obstetric history: OB History  Gravida Para Term Preterm AB SAB TAB Ectopic Multiple Living  1 0            # Outcome Date GA Lbr Len/2nd Weight Sex Delivery Anes PTL Lv  1 Current               Past Surgical History: History reviewed. No pertinent past surgical history.   Family History: Family History  Problem Relation Age of Onset  . Alcohol abuse Neg Hx   . Arthritis Neg Hx   . Asthma Neg Hx   . Diabetes Neg Hx   . Drug abuse Neg Hx   . Early death Neg Hx   . Birth defects Neg Hx   . Cancer Neg Hx   . COPD Neg Hx   . Depression Neg Hx   . Hearing loss Neg Hx   . Heart disease Neg Hx   . Hyperlipidemia Neg Hx   . Hypertension Neg Hx   . Kidney disease Neg Hx   . Learning disabilities Neg Hx   . Mental illness Neg Hx   . Mental retardation Neg Hx   . Miscarriages / Stillbirths Neg Hx   . Stroke  Neg Hx   . Vision loss Neg Hx   . Varicose Veins Neg Hx     Social History: Social History  Substance Use Topics  . Smoking status: Never Smoker   . Smokeless tobacco: Never Used  . Alcohol Use: No    Allergies: No Known Allergies  Meds:  Prescriptions prior to admission  Medication Sig Dispense Refill Last Dose  . ACCU-CHEK FASTCLIX LANCETS MISC 1 each by Percutaneous route 4 (four) times daily. For testing 4 times daily for DX GDM O24.419 100 each 12 Taking  . aspirin 81 MG tablet Take 81 mg by mouth  daily.   Taking  . DiphenhydrAMINE HCl (BENADRYL ALLERGY PO) Take by mouth.   Taking  . glucose blood (ACCU-CHEK AVIVA) test strip Accu-chek aviva Plus test strips for testing 4 times daily for DX GDM O24.419 100 each 12 Taking  . labetalol (NORMODYNE) 200 MG tablet Take 1 tablet (200 mg total) by mouth 2 (two) times daily. 60 tablet 3 Taking  . Prenatal Vit-Fe Fumarate-FA (PNV PRENATAL PLUS MULTIVITAMIN) 27-1 MG TABS Take 1 tablet by mouth daily. 30 tablet 11 Taking  . ursodiol (ACTIGALL) 300 MG capsule Take 1 capsule (300 mg total) by mouth 3 (three) times daily. 90 capsule 3 Taking    ROS: Pertinent findings in history of present illness.  Physical Exam  Blood pressure 107/91, pulse 89, temperature 98.6 F (37 C), temperature source Oral, resp. rate 18, height  (1.803 m), weight 92.534 kg (204 lb), last menstrual period 05/25/2014. GENERAL: Well-developed, well-nourished female in no acute distress.  HEENT: normocephalic HEART: normal rate RESP: normal effort ABDOMEN: Soft, non-tender, gravid appropriate for gestational age EXTREMITIES: Nontender, no edema NEURO: alert and oriented    FHT:  Baseline 140 , moderate variability, accelerations present, no decelerations Contractions: irregular   Labs: No results found for this or any previous visit (from the past 24 hour(s)).  Imaging:  [place] Mfm Fetal Bpp Wo Non Stress  02/06/2015   OBSTETRICAL ULTRASOUND: This exam  was performed within a [facility]. The OB US report was generated in the AS system, and faxed to the ordering physician.   This report is available in the [employer]. See the AS Obstetric US report via the Image Link.  [place] Mfm Ob Follow Up  02/06/2015   OBSTETRICAL ULTRASOUND: This exam was performed within a [facility]. The OB US report was generated in the AS system, and faxed to the ordering physician.   This report is available in the [employer]. See the AS Obstetric US report via the Image Link.  [place] Mfm Ob Follow Up  01/11/2015   OBSTETRICAL ULTRASOUND: This exam was performed within a [facility]. The OB US report was generated in the AS system, and faxed to the ordering physician.   This report is available in the [employer]. See the AS Obstetric US report via the Image Link.  Assessment: 1. Labor: IOL 2/2 cholestasis of preg 2. Fetal Wellbeing: Category 1  3. Pain Control: undecided at this time 4. GBS: Pos 5. 37.0 week IUP  Plan:  1. Admit to BS per consult with MD 2. Routine L&D and IOL orders 3. Analgesia/anesthesia PRN  4. Initiate Cytotec for induction      Medication List    ASK your doctor about these medications        ACCU-CHEK FASTCLIX LANCETS Misc  1 each by Percutaneous route 4 (four) times daily. For testing 4 times daily for DX GDM O24.419     aspirin 81 MG tablet  Take 81 mg by mouth daily.     BENADRYL ALLERGY PO  Take by mouth.     glucose blood test strip  Commonly known as:  ACCU-CHEK AVIVA  Accu-chek aviva Plus test strips for testing 4 times daily for DX GDM O24.419     labetalol 200 MG tablet  Commonly known as:  NORMODYNE  Take 1 tablet (200 mg total) by mouth 2 (two) times daily.     PNV PRENATAL PLUS MULTIVITAMIN 27-1 MG Tabs  Take 1 tablet  by mouth daily.     ursodiol 300 MG capsule  Commonly known as:  ACTIGALL  Take 1 capsule (300 mg total) by mouth 3 (three) times  daily.       Kathee Delton, MD 02/08/2015 7:46 AM  OB fellow attestation: I have seen and examined this patient; I agree with above documentation in the resident's note.   Amanda Ellison is a 24 y.o. G1P0 here for IOL for cholestasis (Bile Acids 27.7), cHTN vs gHTN, A2GDM  PE: BP 119/72 mmHg  Pulse 75  Temp(Src) 98.9 F (37.2 C) (Oral)  Resp 18  Ht  (1.803 m)  Wt 204 lb (92.534 kg)  BMI 28.46 kg/m2  LMP 05/25/2014 Gen: calm comfortable, NAD Resp: normal effort, no distress Abd: gravid  ROS, labs, PMH reviewed  Plan: 1. Admit to BS 2. Routine L&D and IOL orders 3. Analgesia/anesthesia PRN  4. Initiate Cytotec for induction. Plan for FB when more favorable/mid position 5. Cholestasis: continue actigal  6. CHTN/gHTN; Has labetalol at home but BP is well controlled. Will monitor and hold BB appropriately 7. A2GDM, per documentation in clinic was supposed to be on glyburide. Monitor BG in labor.   Federico Flake, MD  Family Medicine, OB Fellow 02/08/2015, 11:09 AM

## 2015-02-08 NOTE — Progress Notes (Signed)
Med Student in room Desmond Lope doing H&P via interpreter

## 2015-02-08 NOTE — Progress Notes (Signed)
Labor Progress Note Avleen Gaulin is a 24 y.o. G1P0 at [redacted]w[redacted]d presented for IOL for cholestasis (Bile acids 27). Pregnancy also complicated by cHTN, A2GDM  S: Having blood show.   O:  BP 133/89 mmHg  Pulse 58  Temp(Src) 98.7 F (37.1 C) (Oral)  Resp 18  Ht  (1.803 m)  Wt 204 lb (92.534 kg)  BMI 28.46 kg/m2  LMP 05/25/2014 EFM: 130/mod/+accels, 1 variable deceleration, not deep or recurrent. Good beat to beat variability.   CVE: Dilation: 2 Effacement (%): 50 Cervical Position: Middle, Anterior Station: -2 Presentation: Vertex Exam by:: Newtom MD   A&P: 24 y.o. G1P0 [redacted]w[redacted]d here for IOL 2/2 to cholestasis  #Labor: S/p cytotec. Foley bulb in place. Pitocin when FB out.  #Pain: prn IV meds #FWB: Cat I #GBS POS, pcn when >5 cm or ROM, orders in signed/held for RN relase.   Federico Flake, MD 7:01 PM

## 2015-02-09 ENCOUNTER — Encounter (HOSPITAL_COMMUNITY): Payer: Self-pay

## 2015-02-09 ENCOUNTER — Inpatient Hospital Stay (HOSPITAL_COMMUNITY): Payer: Medicaid Other | Admitting: Anesthesiology

## 2015-02-09 DIAGNOSIS — K839 Disease of biliary tract, unspecified: Secondary | ICD-10-CM

## 2015-02-09 DIAGNOSIS — O26613 Liver and biliary tract disorders in pregnancy, third trimester: Secondary | ICD-10-CM

## 2015-02-09 DIAGNOSIS — O9962 Diseases of the digestive system complicating childbirth: Secondary | ICD-10-CM

## 2015-02-09 DIAGNOSIS — O99824 Streptococcus B carrier state complicating childbirth: Secondary | ICD-10-CM

## 2015-02-09 DIAGNOSIS — O1092 Unspecified pre-existing hypertension complicating childbirth: Secondary | ICD-10-CM

## 2015-02-09 LAB — CBC
HEMATOCRIT: 36.1 % (ref 36.0–46.0)
HEMOGLOBIN: 10.9 g/dL — AB (ref 12.0–15.0)
MCH: 20.6 pg — AB (ref 26.0–34.0)
MCHC: 30.2 g/dL (ref 30.0–36.0)
MCV: 68.4 fL — AB (ref 78.0–100.0)
Platelets: 226 10*3/uL (ref 150–400)
RBC: 5.28 MIL/uL — AB (ref 3.87–5.11)
RDW: 18 % — ABNORMAL HIGH (ref 11.5–15.5)
WBC: 9.6 10*3/uL (ref 4.0–10.5)

## 2015-02-09 LAB — GLUCOSE, CAPILLARY: Glucose-Capillary: 72 mg/dL (ref 65–99)

## 2015-02-09 MED ORDER — ONDANSETRON HCL 4 MG PO TABS: 4.0000 mg | ORAL_TABLET | ORAL | Status: DC | PRN

## 2015-02-09 MED ORDER — DIPHENHYDRAMINE HCL 25 MG PO CAPS: 25.0000 mg | ORAL_CAPSULE | Freq: Four times a day (QID) | ORAL | Status: DC | PRN

## 2015-02-09 MED ORDER — TETANUS-DIPHTH-ACELL PERTUSSIS 5-2.5-18.5 LF-MCG/0.5 IM SUSP: 0.5000 mL | Freq: Once | INTRAMUSCULAR | Status: DC

## 2015-02-09 MED ORDER — BENZOCAINE-MENTHOL 20-0.5 % EX AERO: 1.0000 "application " | INHALATION_SPRAY | CUTANEOUS | Status: DC | PRN

## 2015-02-09 MED ORDER — WITCH HAZEL-GLYCERIN EX PADS: 1.0000 "application " | MEDICATED_PAD | CUTANEOUS | Status: DC | PRN

## 2015-02-09 MED ORDER — IBUPROFEN 600 MG PO TABS
600.0000 mg | ORAL_TABLET | Freq: Four times a day (QID) | ORAL | Status: DC
  Administered 2015-02-09 – 2015-02-11 (×8): 600 mg via ORAL
  Filled 2015-02-09 (×8): qty 1

## 2015-02-09 MED ORDER — BENZOCAINE-MENTHOL 20-0.5 % EX AERO
1.0000 "application " | INHALATION_SPRAY | CUTANEOUS | Status: DC | PRN
  Administered 2015-02-09: 1 via TOPICAL
  Filled 2015-02-09: qty 56

## 2015-02-09 MED ORDER — OXYCODONE-ACETAMINOPHEN 5-325 MG PO TABS
1.0000 | ORAL_TABLET | ORAL | Status: DC | PRN
  Administered 2015-02-10: 1 via ORAL
  Filled 2015-02-09: qty 1

## 2015-02-09 MED ORDER — ONDANSETRON HCL 4 MG/2ML IJ SOLN: 4.0000 mg | INTRAMUSCULAR | Status: DC | PRN

## 2015-02-09 MED ORDER — SENNOSIDES-DOCUSATE SODIUM 8.6-50 MG PO TABS: 2.0000 | ORAL_TABLET | ORAL | Status: DC

## 2015-02-09 MED ORDER — LANOLIN HYDROUS EX OINT: TOPICAL_OINTMENT | CUTANEOUS | Status: DC | PRN

## 2015-02-09 MED ORDER — INFLUENZA VAC SPLIT QUAD 0.5 ML IM SUSY: 0.5000 mL | PREFILLED_SYRINGE | INTRAMUSCULAR | Status: DC

## 2015-02-09 MED ORDER — RHO D IMMUNE GLOBULIN 1500 UNIT/2ML IJ SOSY
300.0000 ug | PREFILLED_SYRINGE | Freq: Once | INTRAMUSCULAR | Status: AC
  Administered 2015-02-10: 300 ug via INTRAMUSCULAR
  Filled 2015-02-09: qty 2

## 2015-02-09 MED ORDER — LIDOCAINE HCL (PF) 1 % IJ SOLN
INTRAMUSCULAR | Status: DC | PRN
  Administered 2015-02-09: 5 mL via EPIDURAL
  Administered 2015-02-09: 4 mL via EPIDURAL

## 2015-02-09 MED ORDER — OXYCODONE-ACETAMINOPHEN 5-325 MG PO TABS: 2.0000 | ORAL_TABLET | ORAL | Status: DC | PRN

## 2015-02-09 MED ORDER — PRENATAL MULTIVITAMIN CH: 1.0000 | ORAL_TABLET | Freq: Every day | ORAL | Status: DC

## 2015-02-09 MED ORDER — SIMETHICONE 80 MG PO CHEW: 80.0000 mg | CHEWABLE_TABLET | ORAL | Status: DC | PRN

## 2015-02-09 MED ORDER — ACETAMINOPHEN 325 MG PO TABS: 650.0000 mg | ORAL_TABLET | ORAL | Status: DC | PRN

## 2015-02-09 MED ORDER — OXYTOCIN 40 UNITS IN LACTATED RINGERS INFUSION - SIMPLE MED: 62.5000 mL/h | INTRAVENOUS | Status: DC | PRN

## 2015-02-09 MED ORDER — DIBUCAINE 1 % RE OINT: 1.0000 "application " | TOPICAL_OINTMENT | RECTAL | Status: DC | PRN

## 2015-02-09 MED ORDER — MEASLES, MUMPS & RUBELLA VAC ~~LOC~~ INJ: 0.5000 mL | INJECTION | Freq: Once | SUBCUTANEOUS | Status: DC

## 2015-02-09 MED ORDER — ZOLPIDEM TARTRATE 5 MG PO TABS: 5.0000 mg | ORAL_TABLET | Freq: Every evening | ORAL | Status: DC | PRN

## 2015-02-09 MED ORDER — PRENATAL MULTIVITAMIN CH
1.0000 | ORAL_TABLET | Freq: Every day | ORAL | Status: DC
  Administered 2015-02-10 – 2015-02-11 (×2): 1 via ORAL
  Filled 2015-02-09 (×2): qty 1

## 2015-02-09 MED ORDER — SENNOSIDES-DOCUSATE SODIUM 8.6-50 MG PO TABS
2.0000 | ORAL_TABLET | ORAL | Status: DC
  Administered 2015-02-09 – 2015-02-10 (×2): 2 via ORAL
  Filled 2015-02-09 (×2): qty 2

## 2015-02-09 NOTE — Progress Notes (Signed)
   Amanda Ellison is a 24 y.o. G1P0 at [redacted]w[redacted]d  admitted for induction of labor due to cholestasis.  Subjective:  Comfortable with epidural Objective: Filed Vitals:   02/09/15 0440 02/09/15 0446 02/09/15 0451 02/09/15 0457  BP: 128/79 131/78 128/74 133/80  Pulse: 82 73 78 62  Temp:      TempSrc:      Resp: Height:      Weight:          FHT:  FHR: 145 bpm, variability: moderate,  accelerations:  Present,  decelerations:  Absent UC:   regular, every 2-3 minutes SVE:   Dilation: 4.5 Effacement (%): 60 Station: -2 Exam by:: LCarpenter,RN Pitocin @ 14 mu/min Foley fell out around 2300, cx was 4-5/60/-2 Labs: Lab Results  Component Value Date   WBC 9.6 02/09/2015   HGB 10.9* 02/09/2015   HCT 36.1 02/09/2015   MCV 68.4* 02/09/2015   PLT 226 02/09/2015    Assessment / Plan: Induction of labor due to cholestasis,  progressing well on pitocin  Labor: Progressing normally Fetal Wellbeing:  Category I Pain Control:  Epidural Anticipated MOD:  NSVD  [name],[name] 02/09/2015, 6:30 AM

## 2015-02-09 NOTE — Anesthesia Procedure Notes (Signed)
Epidural Patient location during procedure: OB Start time: 02/09/2015 4:31 AM  Staffing Anesthesiologist: Mal Amabile Performed by: anesthesiologist   Preanesthetic Checklist Completed: patient identified, site marked, surgical consent, pre-op evaluation, timeout performed, IV checked, risks and benefits discussed and monitors and equipment checked  Epidural Patient position: sitting Prep: site prepped and draped and DuraPrep Patient monitoring: continuous pulse ox and blood pressure Approach: midline Location: L3-L4 Injection technique: LOR air  Needle:  Needle type: Tuohy  Needle gauge: 17 G Needle length: 9 cm and 9 Needle insertion depth: 6 cm Catheter type: closed end flexible Catheter size: 19 Gauge Catheter at skin depth: 11 cm Test dose: negative and Other  Assessment Events: blood not aspirated, injection not painful, no injection resistance, negative IV test and no paresthesia  Additional Notes Patient identified. Risks and benefits discussed including failed block, incomplete  Pain control, post dural puncture headache, nerve damage, paralysis, blood pressure Changes, nausea, vomiting, reactions to medications-both toxic and allergic and post Partum back pain. All questions were answered. Patient expressed understanding and wished to proceed. Sterile technique was used throughout procedure. Epidural site was Dressed with sterile barrier dressing. No paresthesias, signs of intravascular injection Or signs of intrathecal spread were encountered.  Patient was more comfortable after the epidural was dosed. Please see RN's note for documentation of vital signs and FHR which are stable.

## 2015-02-09 NOTE — Progress Notes (Signed)
Lab called for cbc results not available in epic for pt epidural placement.

## 2015-02-09 NOTE — Progress Notes (Signed)
Lab at bedside drawing cbc

## 2015-02-09 NOTE — Lactation Note (Signed)
This note was copied from the chart of Amanda Alisen Klas. Lactation Consultation Note Initial visit at 5 hours of age.  FOB signed consent to interpret for mom.  [facility] LC resources given and discussed.  Encouraged to feed with early cues on demand.  Early newborn behavior discussed.  Discussed exclusively breastfeeding.  Hand expression demonstrated with very small drop of colostrum visible.  Mom to call for assist as needed.    Patient Name: Amanda Ellison Today's Date: 02/09/2015 Reason for consult: Initial assessment   Maternal Data Has patient been taught Hand Expression?: Yes Does the patient have breastfeeding experience prior to this delivery?: No  Feeding Feeding Type: Breast Fed Length of feed: 60 min  LATCH Score/Interventions                      Lactation Tools Discussed/Used     Consult Status Consult Status: Follow-up Date: 02/10/15 Follow-up type: In-patient    Jannifer Rodney 02/09/2015, 4:58 PM

## 2015-02-09 NOTE — Anesthesia Preprocedure Evaluation (Signed)
Anesthesia Evaluation  Patient identified by MRN, date of birth, ID band Patient awake    Reviewed: Allergy & Precautions, Patient's Chart, lab work & pertinent test results, reviewed documented beta blocker date and time   Airway Mallampati: II  TM Distance: >3 FB Neck ROM: Full    Dental no notable dental hx. (+) Teeth Intact   Pulmonary neg pulmonary ROS,    Pulmonary exam normal breath sounds clear to auscultation       Cardiovascular hypertension, Pt. on medications and Pt. on home beta blockers Normal cardiovascular exam Rhythm:Regular Rate:Normal     Neuro/Psych negative neurological ROS  negative psych ROS   GI/Hepatic Cholestasis of pregnancy   Endo/Other  diabetes, Well Controlled, Gestational  Renal/GU   negative genitourinary   Musculoskeletal negative musculoskeletal ROS (+)   Abdominal   Peds  Hematology  (+) anemia ,   Anesthesia Other Findings   Reproductive/Obstetrics (+) Pregnancy                             Anesthesia Physical Anesthesia Plan  ASA: II  Anesthesia Plan: Epidural   Post-op Pain Management:    Induction:   Airway Management Planned: Natural Airway  Additional Equipment:   Intra-op Plan:   Post-operative Plan:   Informed Consent: I have reviewed the patients History and Physical, chart, labs and discussed the procedure including the risks, benefits and alternatives for the proposed anesthesia with the patient or authorized representative who has indicated his/her understanding and acceptance.     Plan Discussed with: Anesthesiologist  Anesthesia Plan Comments:         Anesthesia Quick Evaluation

## 2015-02-10 LAB — GLUCOSE, CAPILLARY
Glucose-Capillary: 88 mg/dL (ref 65–99)
Glucose-Capillary: 159 mg/dL — ABNORMAL HIGH (ref 65–99)

## 2015-02-10 NOTE — Progress Notes (Signed)
Pt CBG 159. Ate food from home at 0645. Not truly fasting.

## 2015-02-10 NOTE — Lactation Note (Signed)
This note was copied from the chart of Amanda Ellison. Lactation Consultation Note  Patient Name: Amanda Apryle Stowell [id number] Date: 02/10/2015 Reason for consult: Follow-up assessment   Followed up with mom and baby. 8th and 11 th grade step-sisters at bedside. 11th grader served as interpreter. Infant  With 8 BF for 5-60 minutes, 4 voids, 1 smear of Mec stool and 1% weight loss in last 24 hours. Mom reports feedings are going well and said she has no nipple soreness. Mom with large breasts with everted nipples and compressible areola tissue. Infant is dressed in full outfit with 3 blankets under her, one over her lower body and 1 over her head. Removed all but 1 blanket to lower body and discussed Safe Sleeping with family. Infant self awakened. Mom placed infant at breast and was able to latch independently, although she was leaning forward with little support. Assisted mom in placing infant to breast in cross cradle hold with pillows for support. Infant was sleepy and did need stimulation to stay awake. Discussed NL NB feeding behavior, cluster feeds, feeding 8-12 x in 24 hours, and I/O. Referred mom to BF information in Taking Care of Baby and Me. Enc mom to call with questions/concerns prn.    Maternal Data Formula Feeding for Exclusion: No  Feeding Feeding Type: Breast Fed Length of feed: 15 min  LATCH Score/Interventions Latch: Repeated attempts needed to sustain latch, nipple held in mouth throughout feeding, stimulation needed to elicit sucking reflex. Intervention(s): Adjust position;Assist with latch;Breast massage;Breast compression  Audible Swallowing: A few with stimulation  Type of Nipple: Everted at rest and after stimulation  Comfort (Breast/Nipple): Soft / non-tender     Hold (Positioning): Assistance needed to correctly position infant at breast and maintain latch. Intervention(s): Support Pillows;Breastfeeding basics reviewed;Position options;Skin to  skin  LATCH Score: 7  Lactation Tools Discussed/Used     Consult Status Consult Status: Follow-up Date: 02/11/15 Follow-up type: In-patient    Silas Flood Jaylee Lantry 02/10/2015, 4:28 PM

## 2015-02-10 NOTE — Progress Notes (Signed)
Post Partum Day 1  Subjective:  Jaszmine Graca is a 24 y.o. G1P1001 [redacted]w[redacted]d with probable cHTN and A2GDM induced for cholestasis s/p SVD.  No acute events overnight.  Pt denies problems with ambulating, voiding or po intake.  She denies nausea or vomiting.  Pain is well controlled.  She has had flatus. She has not had bowel movement.  Lochia Small.  Plan for birth control is no method.  Method of Feeding: Breast  Objective: BP 125/73 mmHg  Pulse 76  Temp(Src) 98.2 F (36.8 C) (Oral)  Resp 18  Ht  (1.803 m)  Wt 92.534 kg (204 lb)  BMI 28.46 kg/m2  SpO2 78%  LMP 05/25/2014  Breastfeeding? Unknown  Physical Exam:  General: alert, cooperative and no distress Lochia:normal flow Abdomen: soft, nontender Uterine Fundus:  firm at/below umbilicus DVT Evaluation: No evidence of DVT seen on physical exam. Extremities: No LE edema  Recent Labs  02/08/15 0755 02/09/15 0315  HGB 10.2* 10.9*  HCT 34.0* 36.1    Assessment/Plan:  ASSESSMENT: Stachia Horiuchi is a 24 y.o. G1P1001 [redacted]w[redacted]d ppd #1 s/p NSVD doing well.   Plan for discharge tomorrow, Breastfeeding and Contraception none   LOS: 2 days   Jamelle Haring 02/10/2015, 12:46 PM

## 2015-02-10 NOTE — Anesthesia Postprocedure Evaluation (Signed)
Anesthesia Post Note  Patient: Amanda Ellison  Procedure(s) Performed: * No procedures listed *  Anesthesia type: Epidural  Patient location: Mother/Baby  Post pain: Pain level controlled  Post assessment: Post-op Vital signs reviewed  Last Vitals:  Filed Vitals:   02/10/15 0721  BP: 125/73  Pulse: 76  Temp: 36.8 C  Resp: 18    Post vital signs: Reviewed  Level of consciousness: awake  Complications: No apparent anesthesia complications

## 2015-02-11 LAB — GLUCOSE, CAPILLARY: GLUCOSE-CAPILLARY: 128 mg/dL — AB (ref 65–99)

## 2015-02-11 LAB — RH IG WORKUP (INCLUDES ABO/RH)
ABO/RH(D): O NEG
ANTIBODY SCREEN: NEGATIVE
Fetal Screen: NEGATIVE
GESTATIONAL AGE(WKS): 37.1
Unit division: 0

## 2015-02-11 MED ORDER — IBUPROFEN 600 MG PO TABS: 600.0000 mg | ORAL_TABLET | Freq: Four times a day (QID) | ORAL | Status: DC

## 2015-02-11 NOTE — Discharge Summary (Signed)
OB Discharge Summary  Patient Name: Amanda Ellison DOB: 1990-10-31 MRN: [id number]  Date of admission: 02/08/2015 Delivering MD: Shonna Chock BED[place]ORD   Date of discharge: 02/11/2015  Admitting diagnosis: induction Intrauterine pregnancy: [redacted]w[redacted]d     Secondary diagnosis: asthma     Discharge diagnosis: Term Pregnancy Delivered  GDM                                                                                              Post partum procedures:none  Augmentation: Pitocin  Complications: None  Hospital course:  Onset of Labor With Vaginal Delivery     24 y.o. yo G1P1001 at [redacted]w[redacted]d was admitted in Active Laboron 02/08/2015. Patient had an uncomplicated labor course as follows:  Membrane Rupture Time/Date: 8:41 AM ,02/09/2015   Intrapartum Procedures: Episiotomy: None [1]                                         Lacerations:  3rd degree [4]  Patient had a delivery of a Viable infant. 02/09/2015  Information for the patient's newborn:  Saloni, Lablanc Girl Renad [[id number]]  Delivery Method: Vag-Spont    Pateint had an uncomplicated postpartum course.  She is ambulating, tolerating a regular diet, passing flatus, and urinating well. Patient is discharged home in stable condition on No discharge date for patient encounter.Marland Kitchen    Physical exam  Filed Vitals:   02/09/15 1900 02/10/15 0721 02/10/15 1827 02/11/15 0457  BP: 126/99 125/73 160/67 132/68  Pulse: 73 76 70 67  Temp: 98.8 F (37.1 C) 98.2 F (36.8 C) 98.3 F (36.8 C) 97.7 F (36.5 C)  TempSrc: Oral Oral Oral Oral  Resp: Height:      Weight:      SpO2:       General: alert, cooperative and no distress Lochia: appropriate Uterine Fundus: firm Incision: N/A DVT Evaluation: No evidence of DVT seen on physical exam. Negative Homan's sign. No cords or calf tenderness. Labs: Lab Results  Component Value Date   WBC 9.6 02/09/2015   HGB 10.9* 02/09/2015   HCT 36.1 02/09/2015   MCV 68.4* 02/09/2015    PLT 226 02/09/2015   CMP Latest Ref Rng 12/04/2014  Glucose 65 - 99 mg/dL 621(H)  BUN 7 - 25 mg/dL 6(L)  Creatinine 0.86 - 1.10 mg/dL 5.78(I)  Sodium 696 - 295 mmol/L 133(L)  Potassium 3.5 - 5.3 mmol/L 4.5  Chloride 98 - 110 mmol/L 101  CO2 20 - 31 mmol/L 23  Calcium 8.6 - 10.2 mg/dL 8.8  Total Protein 6.1 - 8.1 g/dL 6.2  Total Bilirubin 0.2 - 1.2 mg/dL 0.2  Alkaline Phos 33 - 115 U/L 78  AST 10 - 30 U/L 15  ALT 6 - 29 U/L 12    Discharge instruction: per After Visit Summary and "Baby and Me Booklet".  Medications:  Current facility-administered medications:  .  acetaminophen (TYLENOL) tablet 650 mg, 650 mg, Oral, Q4H PRN, Kathrynn Running, MD .  benzocaine-Menthol (  DERMOPLAST) 20-0.5 % topical spray 1 application, 1 application, Topical, PRN, Casey Burkitt, MD, 1 application at 02/09/15 1628 .  witch hazel-glycerin (TUCKS) pad 1 application, 1 application, Topical, PRN **AND** dibucaine (NUPERCAINAL) 1 % rectal ointment 1 application, 1 application, Rectal, PRN, Casey Burkitt, MD .  diphenhydrAMINE (BENADRYL) capsule 25 mg, 25 mg, Oral, Q6H PRN, Kathrynn Running, MD .  ibuprofen (ADVIL,MOTRIN) tablet 600 mg, 600 mg, Oral, 4 times per day, Casey Burkitt, MD, 600 mg at 02/11/15 0630 .  Influenza vac split quadrivalent PF (FLUARIX) injection 0.5 mL, 0.5 mL, Intramuscular, Tomorrow-1000, Rhona Raider Stinson, DO, 0.5 mL at 02/11/15 0735 .  lanolin ointment, , Topical, PRN, Casey Burkitt, MD .  ondansetron [facility]) tablet 4 mg, 4 mg, Oral, Q4H PRN **OR** ondansetron (ZOFRAN) injection 4 mg, 4 mg, Intravenous, Q4H PRN, Kathrynn Running, MD .  oxyCODONE-acetaminophen (PERCOCET/ROXICET) 5-325 MG per tablet 1 tablet, 1 tablet, Oral, Q4H PRN, Casey Burkitt, MD, 1 tablet at 02/10/15 2225 .  oxyCODONE-acetaminophen (PERCOCET/ROXICET) 5-325 MG per tablet 2 tablet, 2 tablet, Oral, Q4H PRN, Casey Burkitt, MD .  oxytocin (PITOCIN) IV  infusion 40 units in LR 1000 mL, 62.5 mL/hr, Intravenous, Continuous PRN, Kathrynn Running, MD .  prenatal multivitamin tablet 1 tablet, 1 tablet, Oral, Q1200, Casey Burkitt, MD, 1 tablet at 02/10/15 1319 .  senna-docusate (Senokot-S) tablet 2 tablet, 2 tablet, Oral, Q24H, Kathrynn Running, MD, 2 tablet at 02/10/15 2346 .  simethicone (MYLICON) chewable tablet 80 mg, 80 mg, Oral, PRN, Kathrynn Running, MD .  Tdap (BOOSTRIX) injection 0.5 mL, 0.5 mL, Intramuscular, Once, Casey Burkitt, MD, 0.5 mL at 02/11/15 0735 .  zolpidem (AMBIEN) tablet 5 mg, 5 mg, Oral, QHS PRN, Casey Burkitt, MD  Facility-Administered Medications Ordered in Other Encounters:  .  lidocaine (PF) (XYLOCAINE) 1 % injection, , , Anesthesia Intra-op, Mal Amabile, MD, 5 mL at 02/09/15 0432  Diet: routine diet  Activity: Advance as tolerated. Pelvic rest for 6 weeks.   Outpatient follow up:6 weeks  Postpartum contraception: Undecided  Newborn Data: Live born female  Birth Weight: 7 lb 8.8 oz (3425 g) APGAR: 8, 9  Baby Feeding: Breast Disposition:home with mother   02/11/2015 Ferdie Ping, CNM

## 2015-02-11 NOTE — Lactation Note (Signed)
This note was copied from the chart of Amanda Ellison. Lactation Consultation Note  Patient Name: Amanda Stephaie Dardis [id number] Date: 02/11/2015 Reason for consult: Follow-up assessment Mom does not speak English, FOB and 11th grade daughter were interpreting for her. Mom was changing baby, family said baby had just eaten and they are getting ready to go home now. Family states that mom does not have milk yet so she is offering formula. Mom is latching to both breast then f/u with formula if baby still acts hungry. Touched and belly size, breast changes, and milk transition over the next 2-3 days. Mom is aware of engorgement prevention/treatment. She has lactation O/P and support group information.   Maternal Data    Feeding    LATCH Score/Interventions                      Lactation Tools Discussed/Used     Consult Status Consult Status: Complete Date: 02/11/15 Follow-up type: Call as needed    Rulon Eisenmenger 02/11/2015, 9:09 AM

## 2015-03-15 ENCOUNTER — Ambulatory Visit (INDEPENDENT_AMBULATORY_CARE_PROVIDER_SITE_OTHER): Payer: Medicaid Other | Admitting: Obstetrics & Gynecology

## 2015-03-15 ENCOUNTER — Encounter: Payer: Medicaid Other | Admitting: Obstetrics & Gynecology

## 2015-03-15 ENCOUNTER — Encounter: Payer: Self-pay | Admitting: Obstetrics & Gynecology

## 2015-03-15 DIAGNOSIS — O24419 Gestational diabetes mellitus in pregnancy, unspecified control: Secondary | ICD-10-CM

## 2015-03-15 DIAGNOSIS — O163 Unspecified maternal hypertension, third trimester: Secondary | ICD-10-CM

## 2015-03-15 DIAGNOSIS — Z843 Family history of consanguinity: Secondary | ICD-10-CM

## 2015-03-15 NOTE — Progress Notes (Signed)
This encounter was created in error - please disregard.

## 2015-03-15 NOTE — Patient Instructions (Signed)
Return to clinic for any obstetric concerns or go to [facility] for evaluation  

## 2015-03-15 NOTE — Progress Notes (Signed)
Patient ID: Amanda Ellison, female   DOB: [birth year], 24 y.o.   MRN: [id number] Subjective:     Amanda Ellison is a 24 y.o. 241P1001 female who presents for a postpartum visit. She speaks [place], [place] dialect, unable to get medical interpreter on phone for this so husband did interpretation over the phone.  She is 5 weeks postpartum following a spontaneous vaginal delivery. I have fully reviewed the prenatal and intrapartum course; patient had hypertension in pregnancy, gestational diabetes. The delivery was at 37 gestational weeks. Outcome: spontaneous vaginal delivery. Anesthesia: epidural. Postpartum course has been uncomplicated. Baby's course has been uncomplicated. Baby is feeding by breast. Bleeding no bleeding. Bowel function is normal. Bladder function is normal. Patient is not sexually active. Contraception method is none. Postpartum depression screening: negative.  The following portions of the patient's history were reviewed and updated as appropriate: allergies, current medications, past family history, past medical history, past social history, past surgical history and problem list.  Review of Systems Pertinent items noted in HPI and remainder of comprehensive ROS otherwise negative.   Objective:    BP 131/82 mmHg  Pulse 75  Temp(Src) 98.4 F (36.9 C)  Wt 193 lb 8 oz (87.771 kg)  Breastfeeding? Yes  General:  alert and no distress   Breasts:  inspection negative, no nipple discharge or bleeding, no masses or nodularity palpable  Lungs: clear to auscultation bilaterally  Heart:  regular rate and rhythm  Abdomen: soft, non-tender; bowel sounds normal; no masses,  no organomegaly  Pelvic:  not evaluated        Assessment:   Normal postpartum exam. Pap smear not done at today's visit.   Plan:   1. Contraception: none 2. Patient to return for 2 hr GTT at later date 3. Continue PNVs 4. Follow up for 2 hr GTT and as needed.    Jaynie CollinsUGONNA  Gennie Eisinger, MD, FACOG Attending  Obstetrician & Gynecologist, [facility]

## 2015-03-19 ENCOUNTER — Other Ambulatory Visit: Payer: Medicaid Other

## 2015-03-19 DIAGNOSIS — O99814 Abnormal glucose complicating childbirth: Secondary | ICD-10-CM

## 2015-03-23 LAB — GLUCOSE TOLERANCE, 2 HOURS
GLUCOSE, 2 HOUR: 61 mg/dL — AB (ref 70–139)
GLUCOSE, FASTING: 85 mg/dL (ref 65–99)

## 2015-07-09 IMAGING — US US MFM FETAL NUCHAL TRANSLUCENCY
1 series · 13 of 26 positions shown · non-contrast
Comparison: none

[Series 1: us mfm fetal nuchal translucency · 0.20mm/px · 13 of 26 slices shown]
[im 2/26]
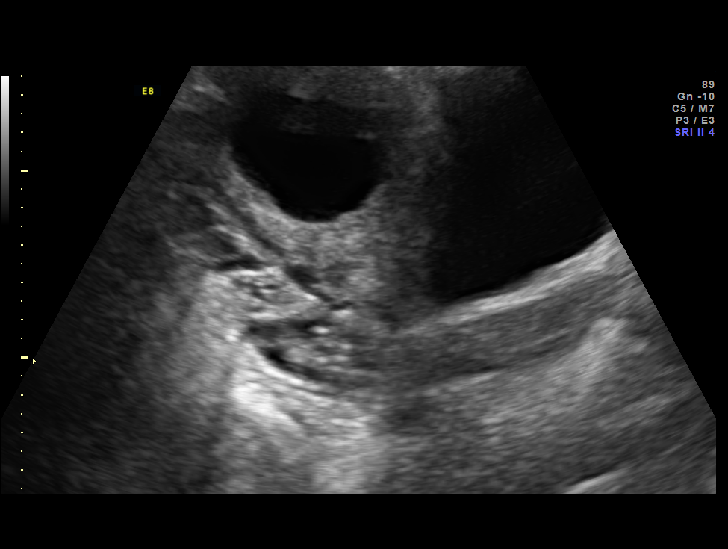
[im 4/26]
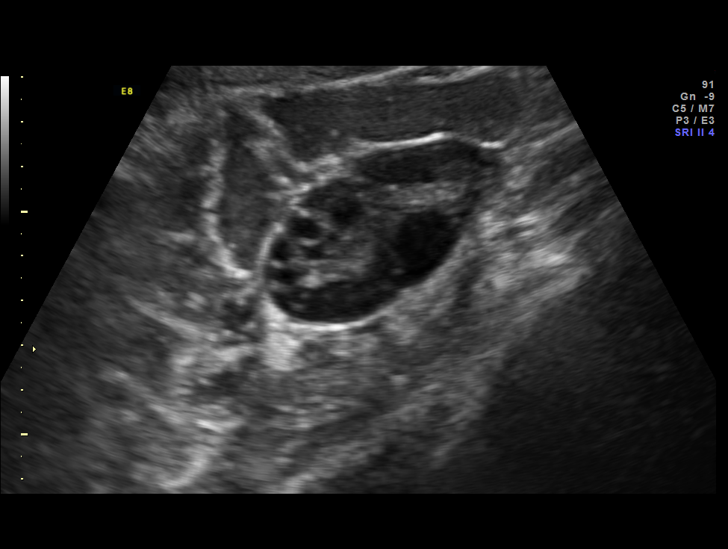
[im 6/26]
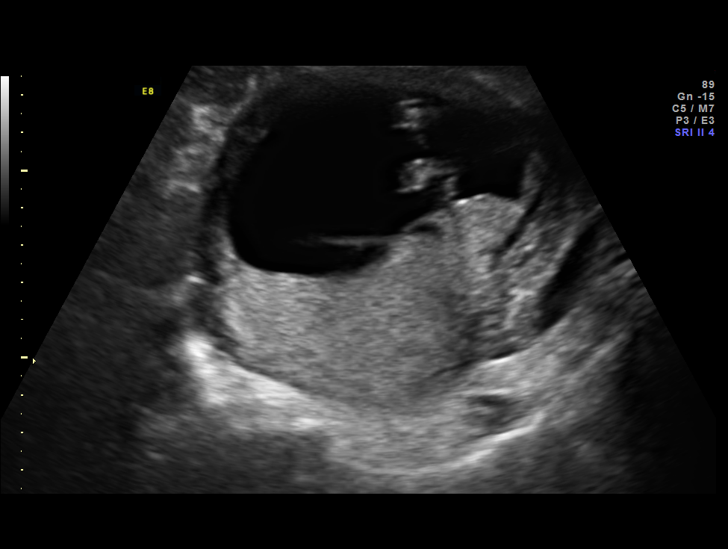
[im 8/26]
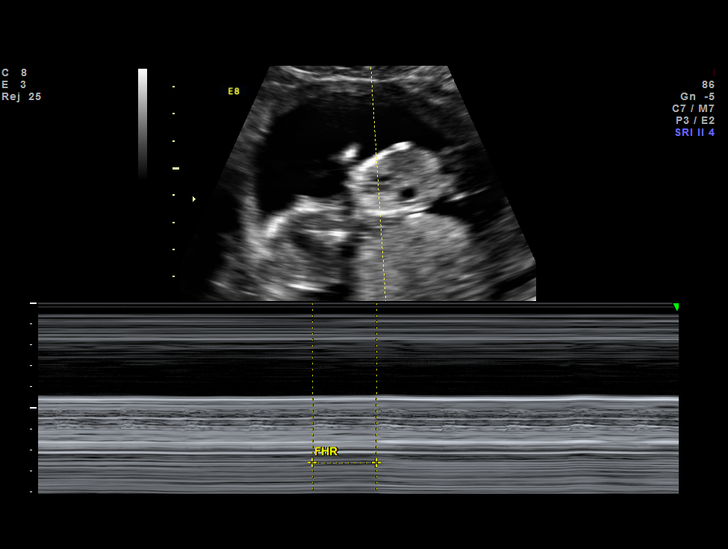
[im 10/26]
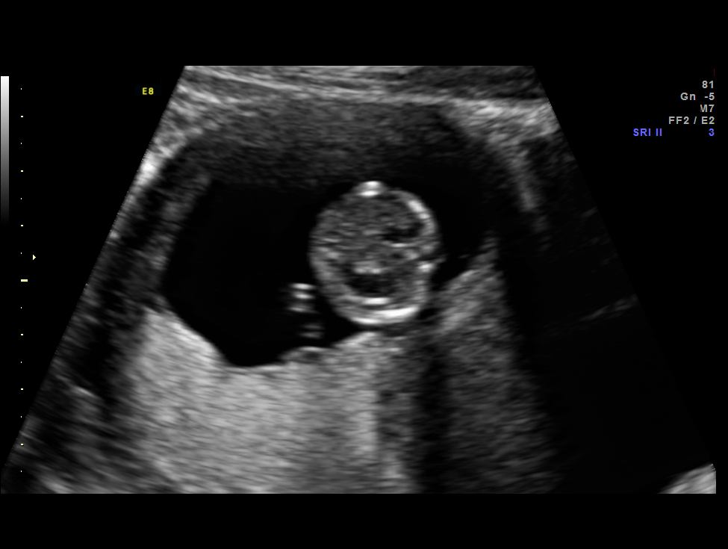
[im 12/26]
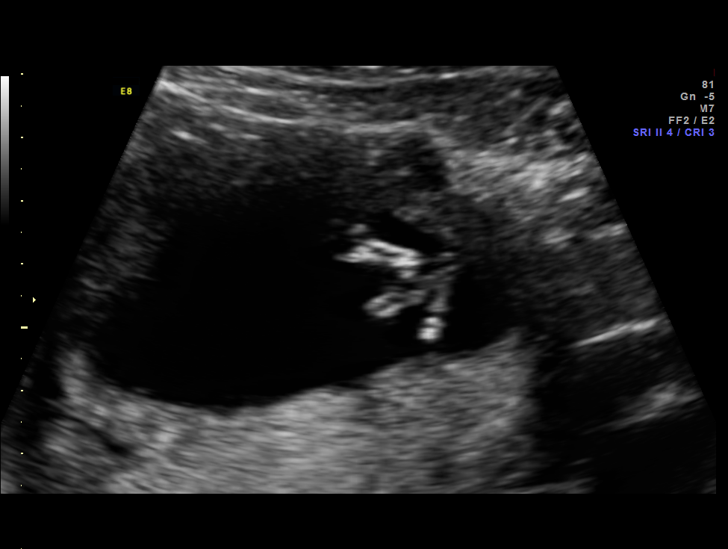
[im 14/26]
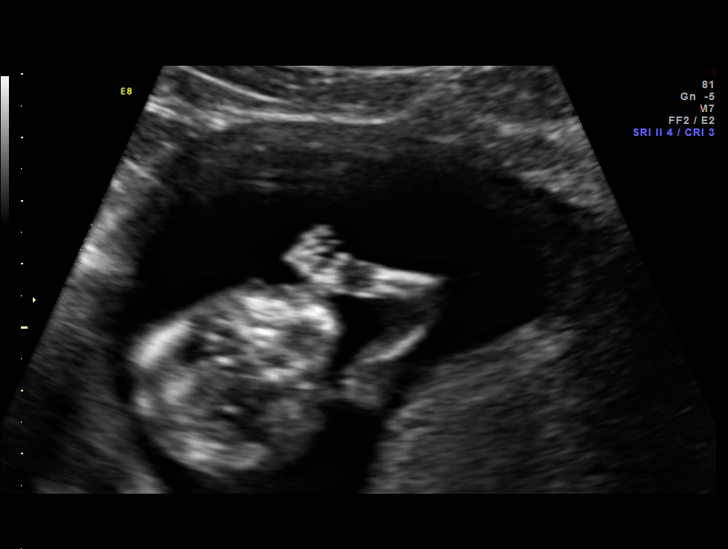
[im 16/26]
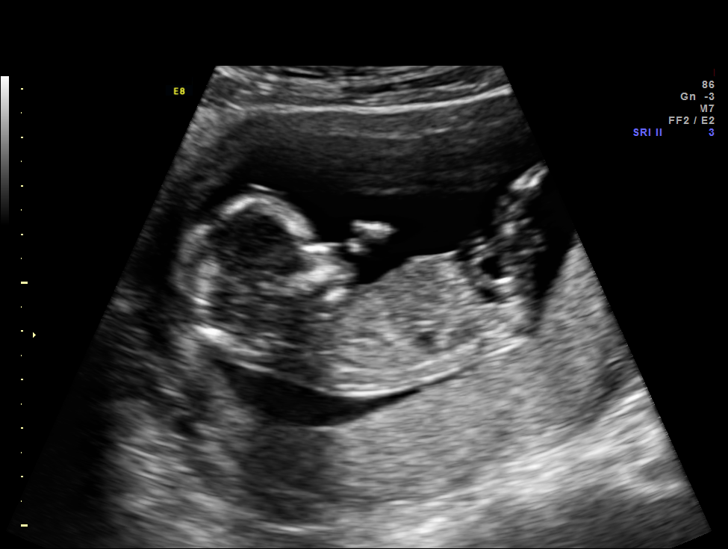
[im 18/26]
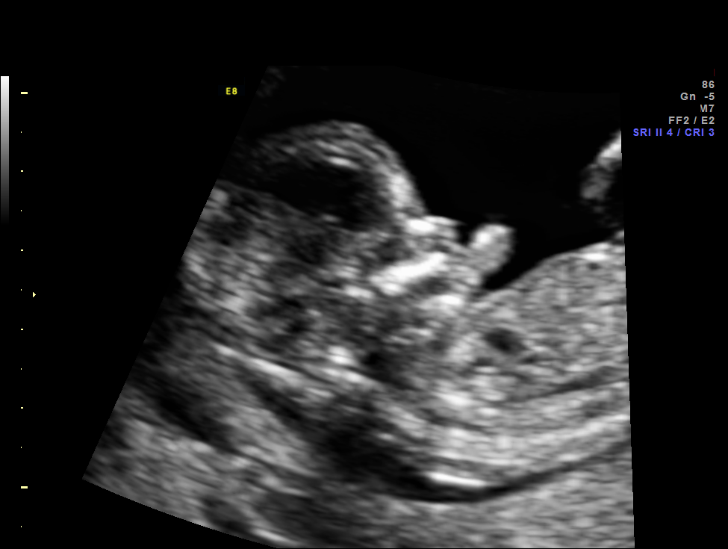
[im 20/26]
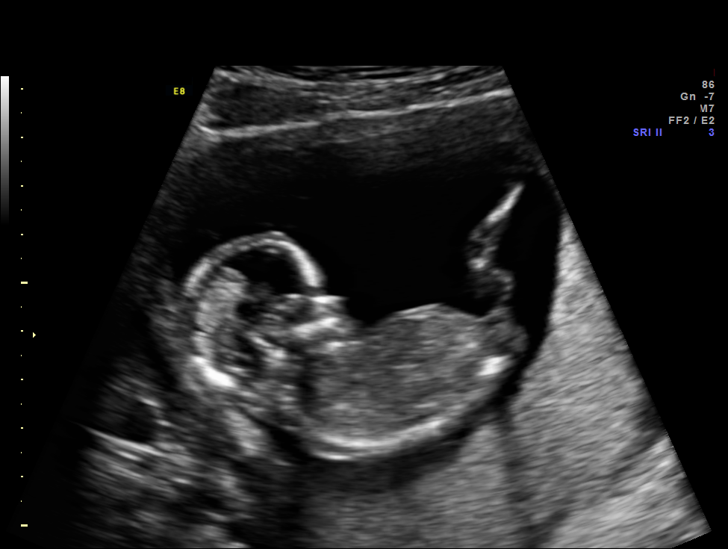
[im 22/26]
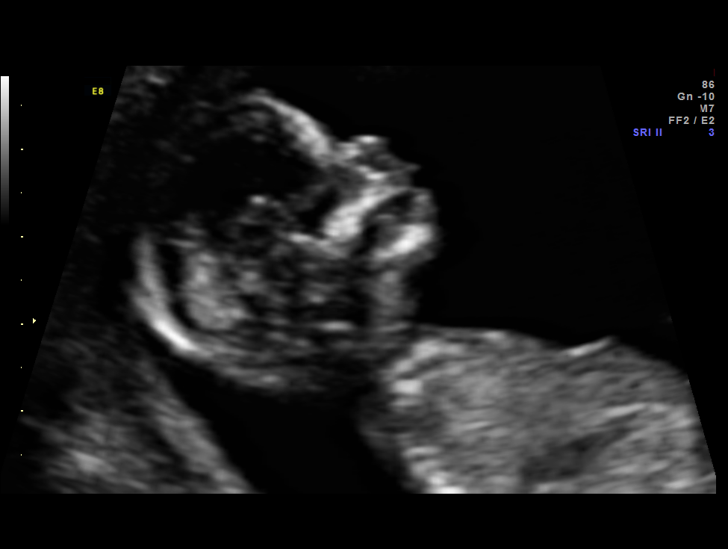
[im 24/26]
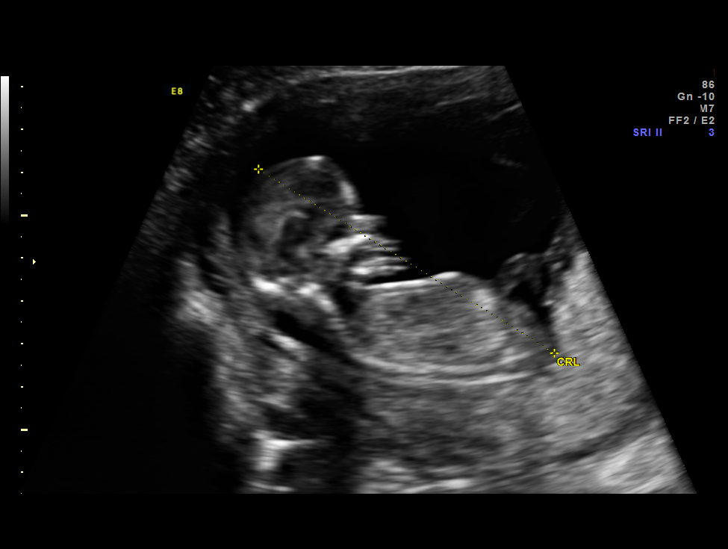
[im 26/26]
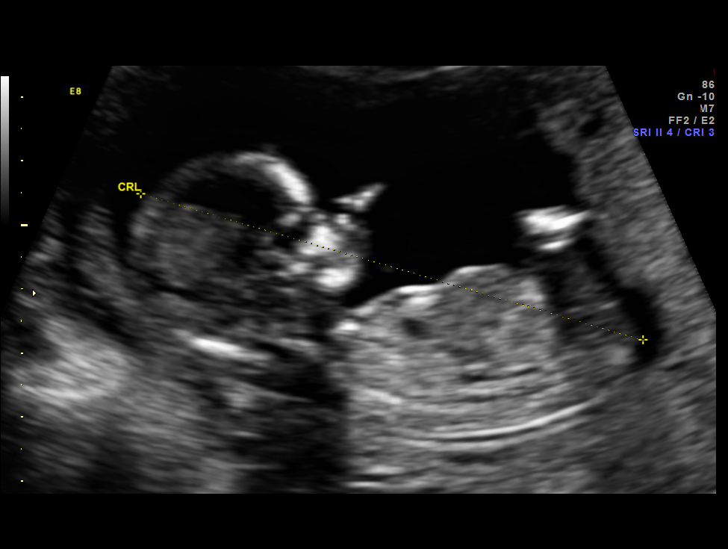

[13 of 26 positions shown; findings below may reference images not displayed]

OBSTETRICS REPORT
(Signed Final 08/29/2014 [DATE])

Service(s) Provided

Indications

First trimester aneuploidy screen (NT)                Z36
Consanguinity
13 weeks gestation of pregnancy
Fetal Evaluation

Num Of Fetuses:    1
Fetal Heart Rate:  157                          bpm
Cardiac Activity:  Observed
Presentation:      Cephalic
Placenta:          Posterior
P. Cord            Visualized
Insertion:

Amniotic Fluid
AFI FV:      Subjectively within normal limits
Gestational Age

LMP:           13w 5d        Date:  05/25/14                 EDD:   03/01/15
Best:          13w 5d     Det. By:  LMP  (05/25/14)          EDD:   03/01/15
1st Trimester Genetic Sonogram Screening

CRL:            81.7  mm    G. Age:   13w 5d                 EDD:   03/01/15
Nuc Trans:       1.4  mm
Nasal Bone:                 Present
Cervix Uterus Adnexa

Cervix:       Normal appearance by transabdominal scan. Appears
closed, without funnelling.
Left Ovary:    Within normal limits.
Right Ovary:   Within normal limits.
Impression

Single IUP at 13w 5d
Normal NT (1.4 mm)  Nasal bone present
First trimester aneuploidy screen performed as noted above.
Please do not draw triple/quad screen, though patient should
be offered MSAFP for neural tube defect screening.
Recommendations

Recommend detailed ultrasound for fetal anatomy at 18-20
weeks

## 2015-11-21 IMAGING — US US MFM OB FOLLOW-UP
1 series · 14 of 28 positions shown · non-contrast
Comparison: none

[Series 1: us mfm ob follow-up · 41 acquisitions, 14 frames shown]
[im 2/41]
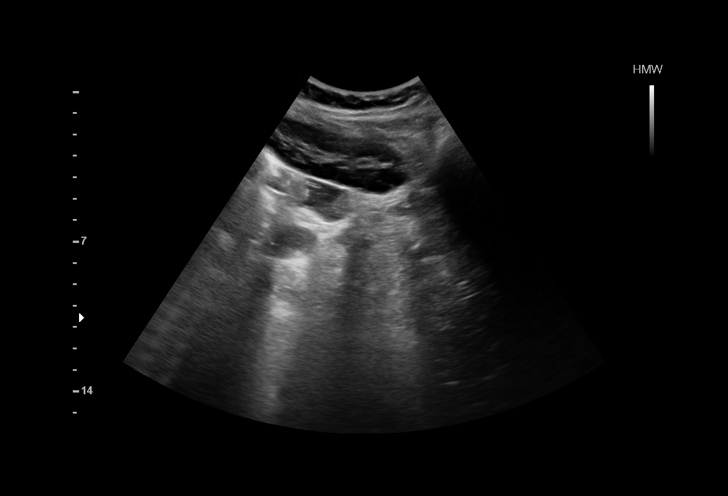
[im 5/41]
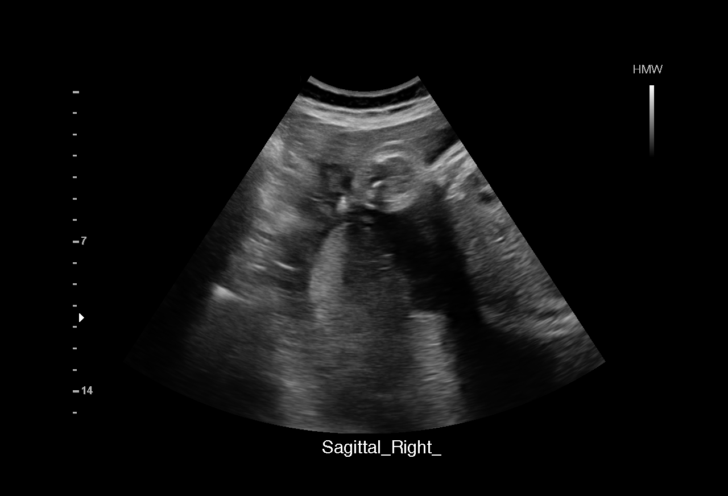
[im 8/41]
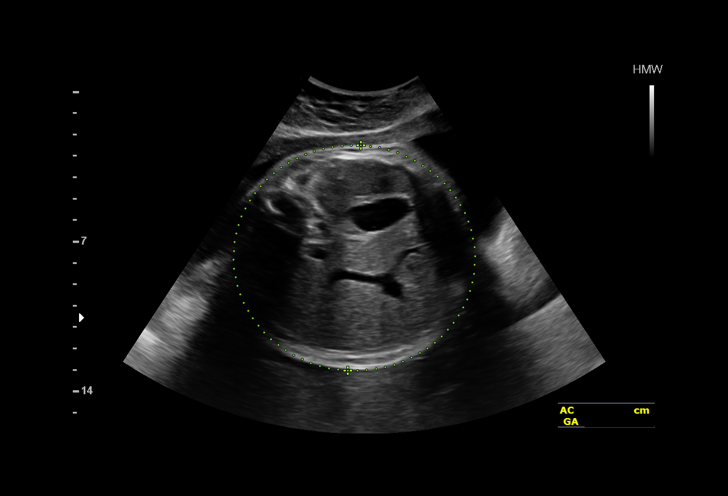
[im 11/41]
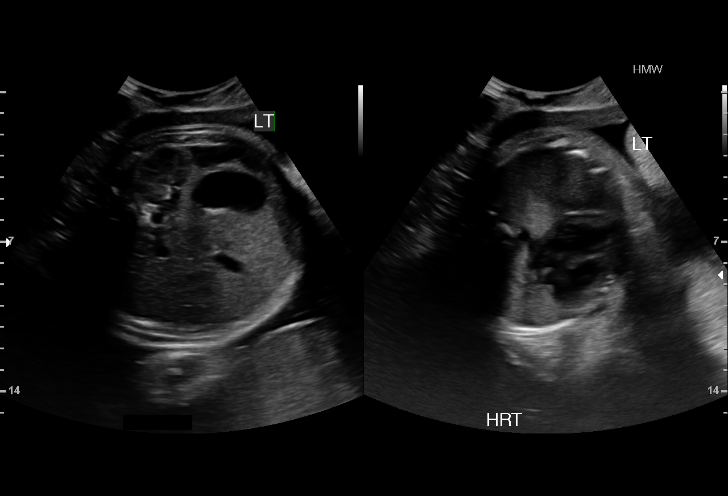
[im 14/41]
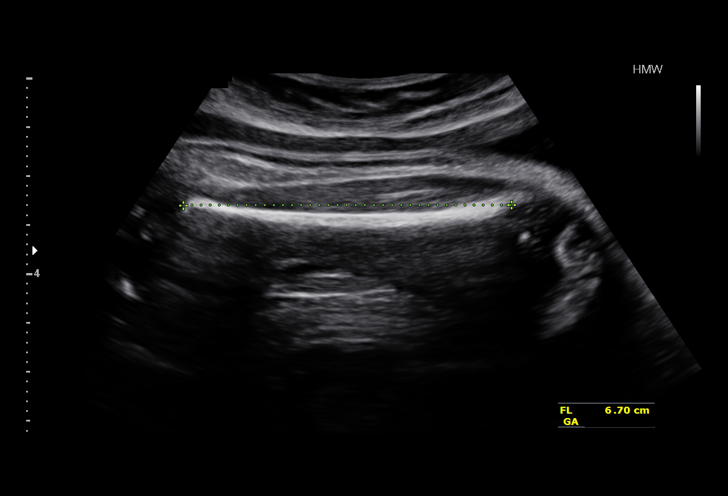
[im 17/41]
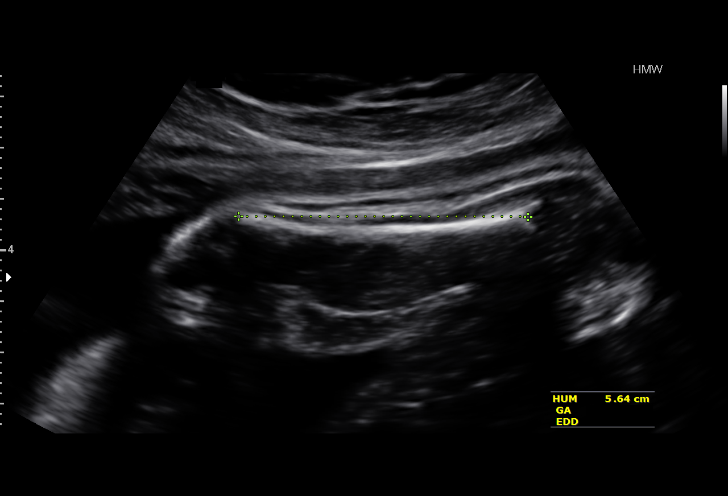
[im 20/41]
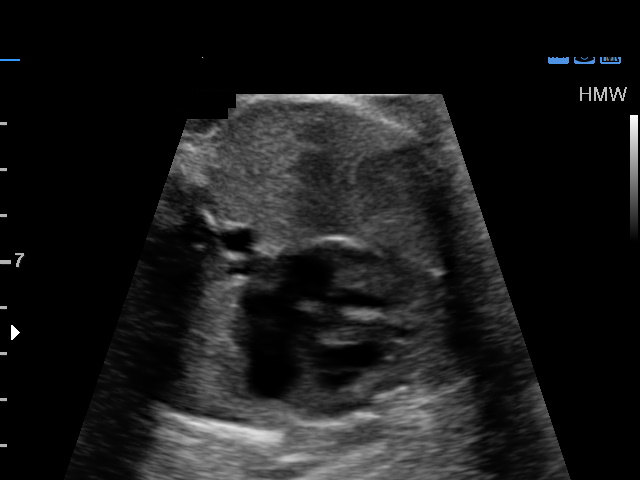
[im 23/41]
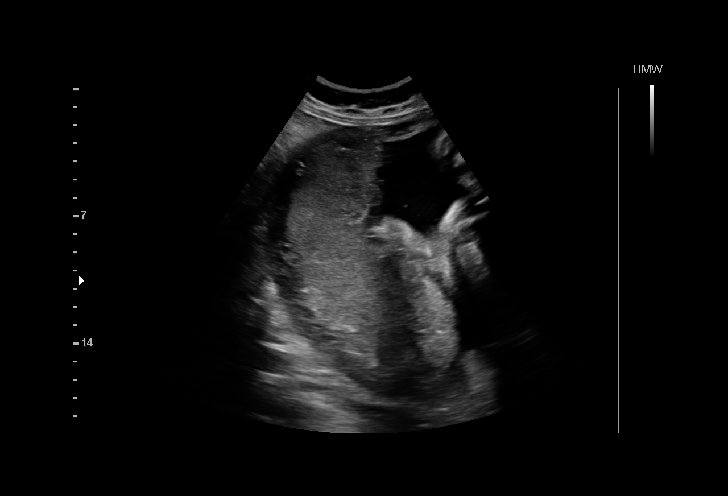
[im 26/41]
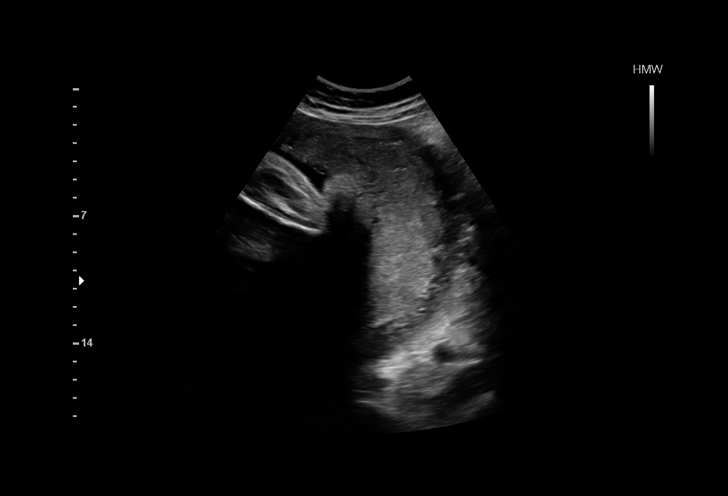
[im 29/41]
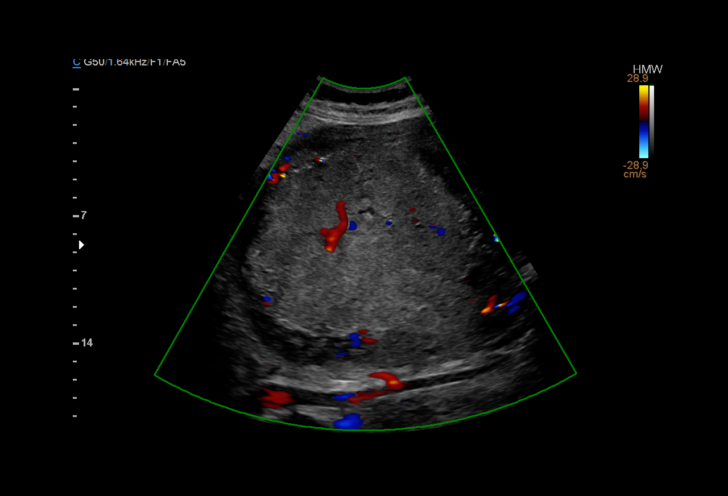
[im 32/41]
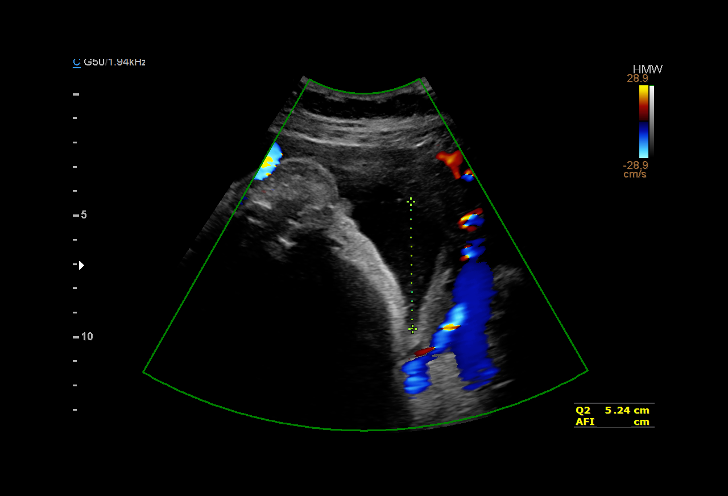
[im 35/41]
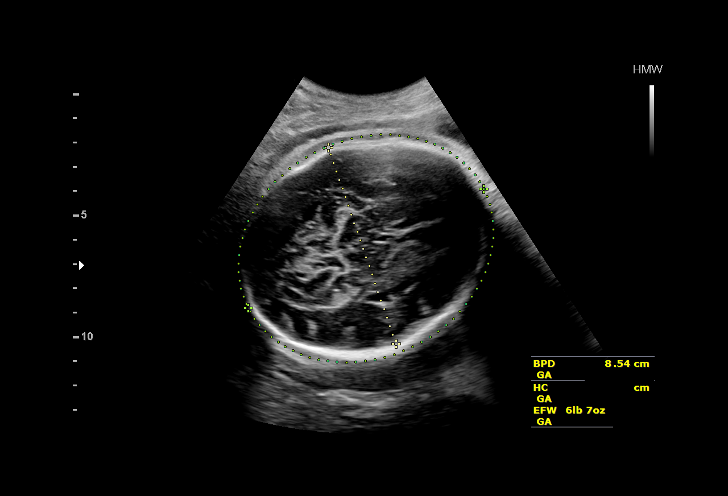
[im 38/41]
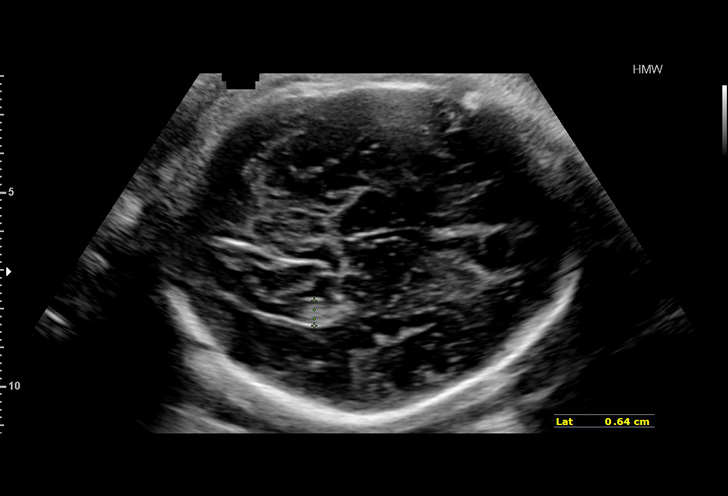
[im 41/41]
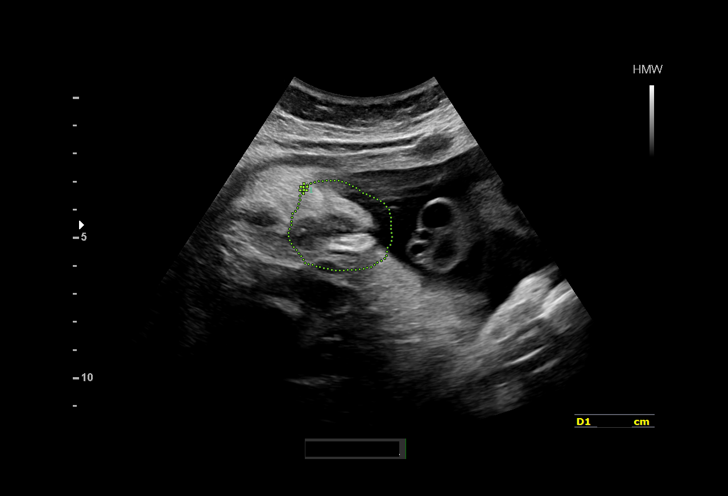

[14 of 28 positions shown; findings below may reference images not displayed]

OBSTETRICS REPORT
(Signed Final 01/11/2015 [DATE])

Name:       FERIENHAUS ERXLEBEN                    Visit  01/11/2015 [DATE]
Date:

Service(s) Provided

Indications

Consanguinity
Advanced paternal age
Placenta previa/Low lying: No bleeding
Cholestasis of pregnancy, third trimrester             [id number]
Rh negative
Hypertension - Chronic/Pre-existing
33 weeks gestation of pregnancy
Fetal Evaluation

Num Of             1
Fetuses:
Fetal Heart        157                          bpm
Rate:
Cardiac Activity:  Observed
Presentation:      Cephalic
Placenta:          Posterior, above cervical
os

Amniotic Fluid
AFI FV:      Subjectively within normal limits
AFI Sum:     19.76    cm      74  %Tile     Larg Pckt:    6.51   cm
RUQ:   6.51    cm    RLQ:   3.1     cm   LUQ:    5.24    cm   LLQ:    4.91   cm
Biometry

BPD:     83.8   m    G. Age:   33w 5d                 CI:        72.16   70 - 86
m
FL/HC:      21.2   19.9 -
21.5
HC:     313.9   m    G. Age:   35w 1d        70  %    HC/AC:      0.92   0.96 -
m
AC:     341.8   m    G. Age:   38w 1d      > 97  %    FL/BPD      79.6   71 - 87
m                                     :
FL:      66.7   m    G. Age:   34w 2d        74  %    FL/AC:      19.5   20 - 24
m
HUM:     56.3   m    G. Age:   32w 5d        53  %
m

Est.        2215   gm    6 lb 7 oz    > 90   %
FW:
Gestational Age

LMP:           33w 0d        Date:  05/25/14                  EDD:   03/01/15
U/S Today:     35w 2d                                         EDD:   02/13/15
Best:          33w 0d    Det. By:   LMP  (05/25/14)           EDD:   03/01/15
Anatomy

Cranium:          Previously seen        Aortic Arch:       Previously seen
Fetal Cavum:      Previously seen        Ductal Arch:       Previously seen
Ventricles:       Appears normal         Diaphragm:         Previously seen
Choroid Plexus:   Previously seen        Stomach:           Appears normal,
left sided
Cerebellum:       Previously seen        Abdomen:           Appears normal
Posterior         Previously seen        Abdominal          Previously seen
Fossa:                                   Wall:
Nuchal Fold:      Previously seen        Cord Vessels:      Previously seen
Face:             Orbits and profile     Kidneys:           Appear normal
previously seen
Lips:             Previously seen        Bladder:           Appears normal
Palate:           Previously seen        Spine:             Previously seen
Heart:            Appears normal         Lower              Previously seen
(4CH, axis, and        Extremities:
situs)
RVOT:             Previously seen        Upper              Previously seen
Extremities:
LVOT:             Previously seen

Other:   Female gender. Heels and 5th digit previously seen.
Targeted Anatomy

Fetal Central Nervous System
Lat. Ventricles:
Cervix Uterus Adnexa

Cervix:       Not visualized (advanced GA >87wks)
Uterus:       No abnormality visualized.

Left Ovary:    Not visualized.
Right Ovary:   Not visualized.

Adnexa:     No abnormality visualized. No adnexal mass visualized.
Impression

Single IUP at 33w 0d
Cholestasis of pregnancy, chronic hypertension, recent
diagnosis of GDM
The estimated fetal weight is > 90th %tile; the AC
measures > 97th %tile
Left, posterior placenta without previa (resolved low lying
placenta)
Normal amniotic fluid volume
Recommendations

Antenatal testing (2x weekly NSTs with weekly AFIs)
Delivery by 37 weeks in the absence of other complications
due to cholestasis of pregnancy

## 2016-02-29 ENCOUNTER — Ambulatory Visit: Payer: Medicaid Other | Admitting: Family Medicine

## 2016-04-24 ENCOUNTER — Ambulatory Visit (INDEPENDENT_AMBULATORY_CARE_PROVIDER_SITE_OTHER): Payer: No Typology Code available for payment source | Admitting: Family Medicine

## 2016-04-24 ENCOUNTER — Encounter: Payer: Self-pay | Admitting: Family Medicine

## 2016-04-24 VITALS — BP 127/77 | HR 77 | Temp 98.8°F | Resp 16 | Ht 68.0 in | Wt 192.0 lb

## 2016-04-24 DIAGNOSIS — N760 Acute vaginitis: Secondary | ICD-10-CM

## 2016-04-24 DIAGNOSIS — D649 Anemia, unspecified: Secondary | ICD-10-CM | POA: Insufficient documentation

## 2016-04-24 DIAGNOSIS — N912 Amenorrhea, unspecified: Secondary | ICD-10-CM

## 2016-04-24 DIAGNOSIS — B9689 Other specified bacterial agents as the cause of diseases classified elsewhere: Secondary | ICD-10-CM

## 2016-04-24 DIAGNOSIS — Z789 Other specified health status: Secondary | ICD-10-CM | POA: Insufficient documentation

## 2016-04-24 DIAGNOSIS — E8881 Metabolic syndrome: Secondary | ICD-10-CM

## 2016-04-24 HISTORY — DX: Anemia, unspecified: D64.9

## 2016-04-24 LAB — HEMOGLOBIN A1C
HEMOGLOBIN A1C: 5.6 % (ref <5.7)
Mean Plasma Glucose: 114 mg/dL

## 2016-04-24 LAB — COMPLETE METABOLIC PANEL WITH GFR
ALT: 24 U/L (ref 6–29)
AST: 23 U/L (ref 10–30)
Albumin: 4.1 g/dL (ref 3.6–5.1)
Alkaline Phosphatase: 81 U/L (ref 33–115)
BILIRUBIN TOTAL: 0.4 mg/dL (ref 0.2–1.2)
BUN: 11 mg/dL (ref 7–25)
CALCIUM: 9.4 mg/dL (ref 8.6–10.2)
CHLORIDE: 104 mmol/L (ref 98–110)
CO2: 24 mmol/L (ref 20–31)
CREATININE: 0.7 mg/dL (ref 0.50–1.10)
GFR, Est African American: >89 mL/min (ref >=60)
GFR, Est Non African American: >89 mL/min (ref >=60)
Glucose, Bld: 97 mg/dL (ref 65–99)
Potassium: 4.4 mmol/L (ref 3.5–5.3)
Sodium: 137 mmol/L (ref 135–146)
TOTAL PROTEIN: 6.9 g/dL (ref 6.1–8.1)

## 2016-04-24 LAB — POCT WET PREP (WET MOUNT)
Clue Cells Wet Prep Whiff POC: POSITIVE
Trichomonas Wet Prep HPF POC: ABSENT

## 2016-04-24 LAB — POCT URINALYSIS DIP (DEVICE)
Bilirubin Urine: NEGATIVE
GLUCOSE, UA: NEGATIVE mg/dL
Hgb urine dipstick: NEGATIVE
Ketones, ur: NEGATIVE mg/dL
Leukocytes, UA: NEGATIVE
Nitrite: NEGATIVE
PROTEIN: NEGATIVE mg/dL
SPECIFIC GRAVITY, URINE: 1.025 (ref 1.005–1.030)
UROBILINOGEN UA: 0.2 mg/dL (ref 0.0–1.0)
pH: 7 (ref 5.0–8.0)

## 2016-04-24 LAB — TSH: TSH: 0.89 mIU/L

## 2016-04-24 LAB — PREGNANCY, URINE

## 2016-04-24 MED ORDER — METRONIDAZOLE 500 MG PO TABS: 500.0000 mg | ORAL_TABLET | Freq: Two times a day (BID) | ORAL | 0 refills | Status: DC

## 2016-04-24 NOTE — Progress Notes (Signed)
Subjective:    Patient ID: Amanda Ellison, female    DOB: 08-26-1990, 25 y.o.   MRN: [id number]  HPI  Amanda Ellison, a 25 year old female presents to establish care. Patient primarily speaks [place]. Will use video interpreter to assist with communication. Patient is complaining of vaginal itching and abnormal vaginal discharge.  for several months. Vaginal symptoms include local irritation, odor and vulvar itching.Vulvar symptoms include vulvar itching. STI Risk minimal, patient sexually active with husband. Discharge described as: copious, white and malodorous. Past Medical History:  Diagnosis Date  . Cholelithiasis affecting pregnancy, antepartum   . Gestational diabetes   . Hypertension    Immunization History  Administered Date(s) Administered  . Influenza,inj,Quad PF,36+ Mos 12/25/2014  . Tdap 12/04/2014   Social History   Social History  . Marital status: Married    Spouse name: N/A  . Number of children: N/A  . Years of education: N/A   Occupational History  . Not on file.   Social History Main Topics  . Smoking status: Never Smoker  . Smokeless tobacco: Never Used  . Alcohol use No  . Drug use: No  . Sexual activity: Not Currently    Birth control/ protection: None   Other Topics Concern  . Not on file   Social History Narrative  . No narrative on file   Review of Systems  Constitutional: Negative.   HENT: Negative.   Eyes: Negative.   Respiratory: Negative.   Cardiovascular: Negative.   Gastrointestinal: Negative.   Endocrine: Negative.   Genitourinary: Positive for vaginal discharge (vaginal itching). Negative for difficulty urinating, dyspareunia and dysuria.       Vaginal odor  Musculoskeletal: Negative.   Skin: Negative.   Allergic/Immunologic: Negative.   Neurological: Negative.   Hematological: Negative.   Psychiatric/Behavioral: Negative.        Objective:   Physical Exam  Constitutional: She is oriented to person, place,  and time. She appears well-nourished.  HENT:  Head: Normocephalic and atraumatic.  Right Ear: External ear normal.  Left Ear: External ear normal.  Nose: Nose normal.  Mouth/Throat: Oropharynx is clear and moist.  Eyes: Conjunctivae and EOM are normal. Pupils are equal, round, and reactive to light.  Neck: Normal range of motion. Neck supple.  Cardiovascular: Normal rate, regular rhythm, normal heart sounds and intact distal pulses.   Pulmonary/Chest: Effort normal and breath sounds normal.  Abdominal: Soft. Bowel sounds are normal.  Musculoskeletal: Normal range of motion.  Neurological: She is alert and oriented to person, place, and time. She has normal reflexes.  Skin: Skin is warm and dry.  Psychiatric: She has a normal mood and affect. Her behavior is normal. Judgment and thought content normal.      BP 127/77 (BP Location: Right Arm, Patient Position: Sitting, Cuff Size: Large)   Pulse 77   Temp 98.8 F (37.1 C) (Oral)   Resp 16   Ht 5\' 8"  (1.727 m)   Wt 192 lb (87.1 kg)   SpO2 100%   BMI 29.19 kg/m  Assessment & Plan:  1. Amenorrhea - Pregnancy, urine - POCT urinalysis dip (device)  2. Anemia, unspecified type - CBC with Differential  3. Metabolic syndrome Recommend a lowfat, low carbohydrate diet divided over 5-6 small meals, increase water intake to 6-8 glasses, and 150 minutes per week of cardiovascular exercise.   - TSH - Hemoglobin A1c - COMPLETE METABOLIC PANEL WITH GFR  4. Bacterial vaginitis - POCT Wet Prep (Wet [place]) - metroNIDAZOLE (  FLAGYL) 500 MG tablet; Take 1 tablet (500 mg total) by mouth 2 (two) times daily.  Dispense: 14 tablet; Refill: 0  5. Language barrier to communication Primarily speaks french, used video interpreter to assist with communication   Preventative maintenance:  Pap smear in 6 months Monthly self breast exams RTC: 6 months for pap smear

## 2016-04-24 NOTE — Patient Instructions (Signed)

## 2016-04-25 LAB — CBC WITH DIFFERENTIAL/PLATELET
BASOS ABS: 0 {cells}/uL (ref 0–200)
Basophils Relative: 0 %
EOS ABS: 50 {cells}/uL (ref 15–500)
EOS PCT: 1 %
HCT: 43.9 % (ref 35.0–45.0)
Hemoglobin: 13.8 g/dL (ref 11.7–15.5)
LYMPHS PCT: 43 %
Lymphs Abs: 2150 cells/uL (ref 850–3900)
MCH: 26.7 pg — AB (ref 27.0–33.0)
MCHC: 31.4 g/dL — ABNORMAL LOW (ref 32.0–36.0)
MCV: 85.1 fL (ref 80.0–100.0)
MONOS PCT: 10 %
MPV: 10.7 fL (ref 7.5–12.5)
Monocytes Absolute: 500 cells/uL (ref 200–950)
Neutro Abs: 2300 cells/uL (ref 1500–7800)
Neutrophils Relative %: 46 %
PLATELETS: 305 10*3/uL (ref 140–400)
RBC: 5.16 MIL/uL — ABNORMAL HIGH (ref 3.80–5.10)
RDW: 13.5 % (ref 11.0–15.0)
WBC: 5 10*3/uL (ref 3.8–10.8)

## 2016-07-28 ENCOUNTER — Ambulatory Visit (INDEPENDENT_AMBULATORY_CARE_PROVIDER_SITE_OTHER): Payer: No Typology Code available for payment source | Admitting: Family Medicine

## 2016-07-28 ENCOUNTER — Encounter: Payer: Self-pay | Admitting: Family Medicine

## 2016-07-28 VITALS — BP 134/75 | HR 73 | Temp 98.2°F | Resp 16 | Ht 68.0 in | Wt 199.0 lb

## 2016-07-28 DIAGNOSIS — D649 Anemia, unspecified: Secondary | ICD-10-CM

## 2016-07-28 DIAGNOSIS — Z789 Other specified health status: Secondary | ICD-10-CM

## 2016-07-28 DIAGNOSIS — R5383 Other fatigue: Secondary | ICD-10-CM

## 2016-07-28 DIAGNOSIS — N926 Irregular menstruation, unspecified: Secondary | ICD-10-CM

## 2016-07-28 LAB — CBC WITH DIFFERENTIAL/PLATELET
Basophils Absolute: 44 cells/uL (ref 0–200)
Basophils Relative: 1 %
Eosinophils Absolute: 88 cells/uL (ref 15–500)
Eosinophils Relative: 2 %
HEMATOCRIT: 39.6 % (ref 35.0–45.0)
HEMOGLOBIN: 12.5 g/dL (ref 11.7–15.5)
LYMPHS ABS: 2200 {cells}/uL (ref 850–3900)
Lymphocytes Relative: 50 %
MCH: 26.7 pg — ABNORMAL LOW (ref 27.0–33.0)
MCHC: 31.6 g/dL — AB (ref 32.0–36.0)
MCV: 84.6 fL (ref 80.0–100.0)
MONO ABS: 440 {cells}/uL (ref 200–950)
MPV: 10.7 fL (ref 7.5–12.5)
Monocytes Relative: 10 %
NEUTROS PCT: 37 %
Neutro Abs: 1628 cells/uL (ref 1500–7800)
Platelets: 293 10*3/uL (ref 140–400)
RBC: 4.68 MIL/uL (ref 3.80–5.10)
RDW: 13.3 % (ref 11.0–15.0)
WBC: 4.4 10*3/uL (ref 3.8–10.8)

## 2016-07-28 LAB — POCT URINALYSIS DIP (DEVICE)
Bilirubin Urine: NEGATIVE
Glucose, UA: NEGATIVE mg/dL
Hgb urine dipstick: NEGATIVE
KETONES UR: NEGATIVE mg/dL
Leukocytes, UA: NEGATIVE
Nitrite: NEGATIVE
PH: 6 (ref 5.0–8.0)
PROTEIN: NEGATIVE mg/dL
SPECIFIC GRAVITY, URINE: 1.015 (ref 1.005–1.030)
Urobilinogen, UA: 0.2 mg/dL (ref 0.0–1.0)

## 2016-07-28 LAB — POCT URINE PREGNANCY: Preg Test, Ur: NEGATIVE

## 2016-07-28 LAB — POCT GLYCOSYLATED HEMOGLOBIN (HGB A1C): Hemoglobin A1C: 5.6

## 2016-07-28 NOTE — Patient Instructions (Addendum)
Will develop treatment plan after reviewing labs.    Secondary Amenorrhea Secondary amenorrhea is the stopping of menstrual flow for 3-6 months in a female who has previously had periods. There are many possible causes. Most of these causes are not serious. Usually, treating the underlying problem causing the loss of menses will return your periods to normal. What are the causes? Some common and uncommon causes of not menstruating include:  Malnutrition.  Low blood sugar (hypoglycemia).  Polycystic ovary disease.  Stress or fear.  Breastfeeding.  Hormone imbalance.  Ovarian failure.  Medicines.  Extreme obesity.  Cystic fibrosis.  Low body weight or drastic weight reduction from any cause.  Early menopause.  Removal of ovaries or uterus.  Contraceptives.  Illness.  Long-term (chronic) illnesses.  Cushing syndrome.  Thyroid problems.  Birth control pills, patches, or vaginal rings for birth control. What increases the risk? You may be at greater risk of secondary amenorrhea if:  You have a family history of this condition.  You have an eating disorder.  You do athletic training. How is this diagnosed? A diagnosis is made by your health care provider taking a medical history and doing a physical exam. This will include a pelvic exam to check for problems with your reproductive organs. Pregnancy must be ruled out. Often, numerous blood tests are done to measure different hormones in the body. Urine testing may be done. Specialized exams (ultrasound, CT scan, MRI, or hysteroscopy) may have to be done as well as measuring the body mass index (BMI). How is this treated? Treatment depends on the cause of the amenorrhea. If an eating disorder is present, this can be treated with an adequate diet and therapy. Chronic illnesses may improve with treatment of the illness. Amenorrhea may be corrected with medicines, lifestyle changes, or surgery. If the amenorrhea cannot be  corrected, it is sometimes possible to create a false menstruation with medicines. Follow these instructions at home:  Maintain a healthy diet.  Manage weight problems.  Exercise regularly but not excessively.  Get adequate sleep.  Manage stress.  Be aware of changes in your menstrual cycle. Keep a record of when your periods occur. Note the date your period starts, how long it lasts, and any problems. Contact a health care provider if: Your symptoms do not get better with treatment. This information is not intended to replace advice given to you by your health care provider. Make sure you discuss any questions you have with your health care provider. Document Released: 06/02/2006 Document Revised: 09/27/2015 Document Reviewed: 10/07/2012 Elsevier Interactive Patient Education  2017 [employer]

## 2016-07-28 NOTE — Progress Notes (Signed)
Subjective:    Patient ID: Amanda Ellison, female    DOB: 20-Jul-1990, 26 y.o.   MRN: [id number]  HPI Ms. Amanda Ellison, a 26 year old female presents complaining of irregular menstrual cycles and fatigue. Patient primarily speaks french and native [place] language. [place] interpreter through video interpreter was unable to understand. Language line could not identify patient's primary language. Patient's husband was contacted and he served as Equities trader. Ms. Hlad says that menstrual cycles have been occurring 2 times per month with spotting in between.Patient's last menstrual period was 07/20/2016. Periods are irregular, lasting 5 days. Dysmenorrhea occurs frequently.  Cyclic symptoms include weight gain and edema.  Patient does not use contraception, she is try to conceive. She denies abnormal pap smear.   Past Medical History:  Diagnosis Date  . Cholelithiasis affecting pregnancy, antepartum   . Gestational diabetes   . Hypertension    Social History   Social History  . Marital status: Married    Spouse name: N/A  . Number of children: N/A  . Years of education: N/A   Occupational History  . Not on file.   Social History Main Topics  . Smoking status: Never Smoker  . Smokeless tobacco: Never Used  . Alcohol use No  . Drug use: No  . Sexual activity: Not Currently    Birth control/ protection: None   Other Topics Concern  . Not on file   Social History Narrative  . No narrative on file   Immunization History  Administered Date(s) Administered  . Influenza,inj,Quad PF,36+ Mos 12/25/2014  . Tdap 12/04/2014   Review of Systems  Constitutional: Positive for fatigue.  Eyes: Negative.  Negative for photophobia and redness.  Respiratory: Negative.   Endocrine: Negative.  Negative for polydipsia, polyphagia and polyuria.  Genitourinary: Negative.   Allergic/Immunologic: Negative for immunocompromised state.  Neurological: Negative.        Objective:   Physical Exam  Constitutional: She is oriented to person, place, and time. She appears well-developed and well-nourished.  HENT:  Head: Normocephalic and atraumatic.  Right Ear: External ear normal.  Left Ear: External ear normal.  Mouth/Throat: Oropharynx is clear and moist.  Eyes: Pupils are equal, round, and reactive to light.  Neck: Normal range of motion.  Cardiovascular: Normal rate, regular rhythm, normal heart sounds and intact distal pulses.   Pulmonary/Chest: Effort normal and breath sounds normal.  Abdominal: Soft. Bowel sounds are normal.  Musculoskeletal: Normal range of motion.  Neurological: She is alert and oriented to person, place, and time. She has normal reflexes.  Skin: Skin is warm and dry.  Psychiatric: She has a normal mood and affect. Her behavior is normal. Judgment and thought content normal.   BP 134/75 (BP Location: Left Arm, Patient Position: Sitting, Cuff Size: Normal)   Pulse 73   Temp 98.2 F (36.8 C) (Oral)   Resp 16   Ht 5\' 8"  (1.727 m)   Wt 199 lb (90.3 kg)   LMP 07/20/2016   SpO2 100%   BMI 30.26 kg/m  Assessment & Plan:  1. Abnormal menstruation - POCT urine pregnancy - FSH/LH  2. Anemia, unspecified type - CBC with Differential  3. Language barrier to communication Husband served as interpreter to assist with communication. Patient will need an interpreter at next appointment. Husband speaks very little [place].   4. Other fatigue Recommend a lowfat, low carbohydrate diet divided over 5-6 small meals, increase water intake to 6-8 glasses, and 150 minutes per week of cardiovascular exercise.   -  HgB A1c   RTC: Will follow up after reviewing labs   Nolon NationsLaChina Moore Hollis  MSN, FNP-C [facility] [address]  [place], [place] [id number] [phone]

## 2016-07-29 LAB — FSH/LH
FSH: 2.4 m[IU]/mL
LH: 1.5 m[IU]/mL

## 2016-10-02 ENCOUNTER — Ambulatory Visit: Payer: Self-pay | Admitting: Family Medicine

## 2016-10-02 ENCOUNTER — Encounter: Payer: Self-pay | Admitting: General Practice

## 2016-10-24 ENCOUNTER — Ambulatory Visit: Payer: Self-pay | Admitting: Obstetrics & Gynecology

## 2016-10-24 ENCOUNTER — Encounter: Payer: Self-pay | Admitting: Obstetrics & Gynecology

## 2016-10-24 VITALS — BP 127/91 | HR 94 | Ht 69.0 in | Wt 208.7 lb

## 2016-10-24 DIAGNOSIS — N898 Other specified noninflammatory disorders of vagina: Secondary | ICD-10-CM

## 2016-10-24 DIAGNOSIS — Z113 Encounter for screening for infections with a predominantly sexual mode of transmission: Secondary | ICD-10-CM

## 2016-10-24 DIAGNOSIS — Z124 Encounter for screening for malignant neoplasm of cervix: Secondary | ICD-10-CM

## 2016-10-24 DIAGNOSIS — N939 Abnormal uterine and vaginal bleeding, unspecified: Secondary | ICD-10-CM | POA: Insufficient documentation

## 2016-10-24 DIAGNOSIS — B9689 Other specified bacterial agents as the cause of diseases classified elsewhere: Secondary | ICD-10-CM

## 2016-10-24 DIAGNOSIS — N760 Acute vaginitis: Secondary | ICD-10-CM

## 2016-10-24 NOTE — Patient Instructions (Addendum)
Thank you for enrolling in MyChart. Please follow the instructions below to securely access your online medical record. MyChart allows you to send messages to your doctor, view your test results, manage appointments, and more.   How Do I Sign Up? 1. In your Internet browser, go to [employer] and enter [email or web address]. 2. Click on the Sign Up Now link in the Sign In box. You will see the New Member Sign Up page. 3. Enter your MyChart Access Code exactly as it appears below. You will not need to use this code after you've completed the sign-up process. If you do not sign up before the expiration date, you must request a new code.  MyChart Access Code: [email or web address] Expires: 12/01/2016  2:38 PM  4. Enter your Social Security Number ([id number]) and Date of Birth (mm/dd/yyyy) as indicated and click Submit. You will be taken to the next sign-up page. 5. Create a MyChart ID. This will be your MyChart login ID and cannot be changed, so think of one that is secure and easy to remember. 6. Create a MyChart password. You can change your password at any time. 7. Enter your Password Reset Question and Answer. This can be used at a later time if you forget your password.  8. Enter your e-mail address. You will receive e-mail notification when new information is available in MyChart. 9. Click Sign Up. You can now view your medical record.   Additional Information Remember, MyChart is NOT to be used for urgent needs. For medical emergencies, dial 911.     Dysfunctional Uterine Bleeding Dysfunctional uterine bleeding is abnormal bleeding from the uterus. Dysfunctional uterine bleeding includes:  A period that comes earlier or later than usual.  A period that is lighter, heavier, or has blood clots.  Bleeding between periods.  Skipping one or more periods.  Bleeding after sexual intercourse.  Bleeding after menopause.  Follow these instructions at home: Pay attention  to any changes in your symptoms. Follow these instructions to help with your condition: Eating and drinking  Eat well-balanced meals. Include foods that are high in iron, such as liver, meat, shellfish, green leafy vegetables, and eggs.  If you become constipated: ? Drink plenty of water. ? Eat fruits and vegetables that are high in water and fiber, such as spinach, carrots, raspberries, apples, and mango. Medicines  Take over-the-counter and prescription medicines only as told by your health care provider.  Do not change medicines without talking with your health care provider.  Aspirin or medicines that contain aspirin may make the bleeding worse. Do not take those medicines: ? During the week before your period. ? During your period.  If you were prescribed iron pills, take them as told by your health care provider. Iron pills help to replace iron that your body loses because of this condition. Activity  If you need to change your sanitary pad or tampon more than one time every 2 hours: ? Lie in bed with your feet raised (elevated). ? Place a cold pack on your lower abdomen. ? Rest as much as possible until the bleeding stops or slows down.  Do not try to lose weight until the bleeding has stopped and your blood iron level is back to normal. Other Instructions  For two months, write down: ? When your period starts. ? When your period ends. ? When any abnormal bleeding occurs. ? What problems you notice.  Keep all follow up visits as told by your health care provider.  This is important. Contact a health care provider if:  You get light-headed or weak.  You have nausea and vomiting.  You cannot eat or drink without vomiting.  You feel dizzy or have diarrhea while you are taking medicines.  You are taking birth control pills or hormones, and you want to change them or stop taking them. Get help right away if:  You develop a fever or chills.  You need to change your  sanitary pad or tampon more than one time per hour.  Your bleeding becomes heavier, or your flow contains clots more often.  You develop pain in your abdomen.  You lose consciousness.  You develop a rash. This information is not intended to replace advice given to you by your health care provider. Make sure you discuss any questions you have with your health care provider. Document Released: 04/18/2000 Document Revised: 09/27/2015 Document Reviewed: 07/17/2014 Elsevier Interactive Patient Education  [employer]2018 Elsevier Inc.

## 2016-10-24 NOTE — Progress Notes (Signed)
   GYNECOLOGY OFFICE VISIT NOTE  History:  26 y.o. G1P1001 here today for evaluation for AUB.She speaks [place], [place] dialect, patient declined medical interpreter and wanted step-daughter to interpret. She signed refusal of medical interpretation form.  Patient was evaluated in 07/2016 for same complaint by internal medicine provider.  She reports  that menstrual cycles have been occurring 2 times per month with spotting in between for last three months. Periods are irregular, light flow, lasting 5 days associated with dysmenorrhea.  Patient does not use contraception, she is trying to conceive. Does not want hormonal regulation of AUB for now.   She denies any abnormal vaginal discharge, bleeding, pelvic pain or other concerns.   Past Medical History:  Diagnosis Date  . Cholelithiasis affecting pregnancy, antepartum   . Gestational diabetes   . Hypertension     History reviewed. No pertinent surgical history.  The following portions of the patient's history were reviewed and updated as appropriate: allergies, current medications, past family history, past medical history, past social history, past surgical history and problem list.   Health Maintenance:  No recent pap smear in system  Review of Systems:  Pertinent items noted in HPI and remainder of comprehensive ROS otherwise negative.   Objective:  Physical Exam BP (!) 127/91   Pulse 94   Ht 5\' 9"  (1.753 m)   Wt 208 lb 11.2 oz (94.7 kg)   LMP 08/28/2016   BMI 30.82 kg/m  CONSTITUTIONAL: Well-developed, well-nourished female in no acute distress.  HENT:  Normocephalic, atraumatic. External right and left ear normal. Oropharynx is clear and moist EYES: Conjunctivae and EOM are normal. Pupils are equal, round, and reactive to light. No scleral icterus.  NECK: Normal range of motion, supple, no masses SKIN: Skin is warm and dry. No rash noted. Not diaphoretic. No erythema. No pallor. NEUROLOGIC: Alert and oriented to person, place,  and time. Normal reflexes, muscle tone coordination. No cranial nerve deficit noted. PSYCHIATRIC: Normal mood and affect. Normal behavior. Normal judgment and thought content. CARDIOVASCULAR: Normal heart rate noted RESPIRATORY: Effort and breath sounds normal, no problems with respiration noted ABDOMEN: Soft, no distention noted.   PELVIC: Normal appearing external genitalia; normal appearing vaginal mucosa and cervix.  Yellow discharge noted.  Normal uterine size, no other palpable masses, no uterine or adnexal tenderness. MUSCULOSKELETAL: Normal range of motion. No edema noted.  Labs and Imaging No results found.  Assessment & Plan:  1. Abnormal uterine bleeding (AUB) - CBC - Beta HCG, Quant - TestT+TestF+SHBG - Prolactin - TSH - US Pelvis Complete; Future - US Transvaginal Non-OB; Future Declines hormonal management for now.  Will follow up results and manage accordingly.  Bleeding precautions reviewed. Routine preventative health maintenance measures emphasized. Please refer to After Visit Summary for other counseling recommendations.   Return in about 2 weeks (around 11/07/2016) for followup or earier if needed.  , or if symptoms worsen or fail to improve,  Total face-to-face time with patient: 25 minutes. Over 50% of encounter was spent on counseling and coordination of care.   Jaynie CollinsUGONNA  ANYANWU, MD, FACOG Attending Obstetrician & Gynecologist, [facility], [facility]

## 2016-10-26 LAB — CBC
HEMATOCRIT: 40.1 % (ref 34.0–46.6)
Hemoglobin: 12.5 g/dL (ref 11.1–15.9)
MCH: 26.4 pg — AB (ref 26.6–33.0)
MCHC: 31.2 g/dL — ABNORMAL LOW (ref 31.5–35.7)
MCV: 85 fL (ref 79–97)
PLATELETS: 271 10*3/uL (ref 150–379)
RBC: 4.74 x10E6/uL (ref 3.77–5.28)
RDW: 14.5 % (ref 12.3–15.4)
WBC: 4.2 10*3/uL (ref 3.4–10.8)

## 2016-10-26 LAB — TESTT+TESTF+SHBG
SEX HORMONE BINDING: 57.5 nmol/L (ref 24.6–122.0)
TESTOSTERONE FREE: 1.3 pg/mL (ref 0.0–4.2)
Testosterone, total: 70.8 ng/dL — ABNORMAL HIGH (ref 10.0–55.0)

## 2016-10-26 LAB — TSH: TSH: 0.03 u[IU]/mL — AB (ref 0.450–4.500)

## 2016-10-26 LAB — PROLACTIN: Prolactin: 8.8 ng/mL (ref 4.8–23.3)

## 2016-10-26 LAB — BETA HCG QUANT (REF LAB): hCG Quant: <1 m[IU]/mL

## 2016-10-27 LAB — CERVICOVAGINAL ANCILLARY ONLY
BACTERIAL VAGINITIS: POSITIVE — AB
CANDIDA VAGINITIS: NEGATIVE
CHLAMYDIA, DNA PROBE: NEGATIVE
NEISSERIA GONORRHEA: NEGATIVE
Trichomonas: NEGATIVE

## 2016-10-28 ENCOUNTER — Telehealth: Payer: Self-pay

## 2016-10-28 ENCOUNTER — Telehealth: Payer: Self-pay | Admitting: General Practice

## 2016-10-28 LAB — CYTOLOGY - PAP: Diagnosis: NEGATIVE

## 2016-10-28 MED ORDER — METRONIDAZOLE 500 MG PO TABS: 500.0000 mg | ORAL_TABLET | Freq: Two times a day (BID) | ORAL | 2 refills | Status: DC

## 2016-10-28 NOTE — Telephone Encounter (Signed)
Called patient regarding lab results. No answer or voice mail to leave a message.

## 2016-10-28 NOTE — Telephone Encounter (Signed)
Called patient, no answer- left message stating we are trying to reach you with results, please call us back. 

## 2016-10-28 NOTE — Addendum Note (Signed)
Addended by: Jaynie CollinsANYANWU, Nazia Rhines A on: 10/28/2016 04:00 PM   Modules accepted: Orders

## 2016-10-28 NOTE — Telephone Encounter (Signed)
-----   Message from Tereso NewcomerUgonna A Anyanwu, MD sent at 10/28/2016  4:01 PM EDT ----- Vaginal discharge test is abnormal and showed bacterial vaginitis. Metronidazole was prescribed. Please inform patient of results and advise to pick up prescription. Husband usually interprets for her; she has signed forms declining official interpreters.

## 2016-11-03 ENCOUNTER — Ambulatory Visit (HOSPITAL_COMMUNITY): Payer: No Typology Code available for payment source | Attending: Obstetrics & Gynecology

## 2016-11-03 NOTE — Telephone Encounter (Signed)
Left message to call us back regarding test results.

## 2016-11-04 ENCOUNTER — Encounter: Payer: Self-pay | Admitting: *Deleted

## 2016-11-04 NOTE — Telephone Encounter (Signed)
Attempted to reach pt @ home telephone number - no answer and message was not left as pt has not returned 2 previous messages. I then called pt's husband cell phone twice. Each time, the line rang for a number of times and then disconnected - no voice mail.  Certified letter will be mailed to pt.

## 2016-11-20 ENCOUNTER — Telehealth: Payer: Self-pay | Admitting: General Practice

## 2016-11-20 DIAGNOSIS — E0590 Thyrotoxicosis, unspecified without thyrotoxic crisis or storm: Secondary | ICD-10-CM

## 2016-11-20 LAB — SPECIMEN STATUS REPORT

## 2016-11-20 LAB — T3, FREE: T3 FREE: 7.4 pg/mL — AB (ref 2.0–4.4)

## 2016-11-20 LAB — T4, FREE: FREE T4: 1.85 ng/dL — AB (ref 0.82–1.77)

## 2016-11-20 NOTE — Telephone Encounter (Signed)
Per chart review, patient also no showed for ultrasound appt. Called patient, no answer- left message stating we are trying to reach you with test results please call us back. Called alternative number, no answer & no option to leave message. Will send letter.

## 2016-11-20 NOTE — Telephone Encounter (Signed)
-----   Message from Tereso NewcomerUgonna A Anyanwu, MD sent at 11/20/2016  8:23 AM EDT ----- Patient is mildly hyperthyroid; this can also contribute to her AUB. Please tell her to follow up [facility] for management of this issue. She needs an appointment with me for AUB follow up also; had high testosterone concerning for possible PCOS.  Of note, last time on 11/04/16, there was an attempt to contact her to discuss results but it was unsuccessful and certified mail was sent. Please call to inform patient of results and recommendations, or re-send certified letter if unable to contact her again.

## 2016-12-02 ENCOUNTER — Ambulatory Visit (HOSPITAL_COMMUNITY): Admission: RE | Admit: 2016-12-02 | Payer: No Typology Code available for payment source | Source: Ambulatory Visit

## 2016-12-31 ENCOUNTER — Ambulatory Visit: Payer: No Typology Code available for payment source | Admitting: Obstetrics & Gynecology

## 2017-01-15 ENCOUNTER — Telehealth: Payer: Self-pay | Admitting: Obstetrics & Gynecology

## 2017-01-15 NOTE — Telephone Encounter (Signed)
Called patient to try to inform her that we needed to reschedule her for her ultrasound that she needs before seeing Dr. Macon LargeAnyanwu. I used Interpreter to call, but was hung-up on at both numbers she has listed in Epic.

## 2017-01-19 ENCOUNTER — Ambulatory Visit: Payer: No Typology Code available for payment source | Admitting: Obstetrics & Gynecology

## 2018-02-16 ENCOUNTER — Encounter: Payer: Self-pay | Admitting: *Deleted

## 2018-03-31 ENCOUNTER — Other Ambulatory Visit: Payer: Self-pay

## 2018-03-31 ENCOUNTER — Inpatient Hospital Stay (HOSPITAL_COMMUNITY)
Admission: AD | Admit: 2018-03-31 | Discharge: 2018-03-31 | Disposition: A | Discharge status: Home / Self Care | Payer: No Typology Code available for payment source | Attending: Obstetrics and Gynecology | Admitting: Obstetrics and Gynecology

## 2018-03-31 ENCOUNTER — Encounter (HOSPITAL_COMMUNITY): Payer: Self-pay

## 2018-03-31 DIAGNOSIS — I10 Essential (primary) hypertension: Secondary | ICD-10-CM | POA: Insufficient documentation

## 2018-03-31 DIAGNOSIS — R1013 Epigastric pain: Secondary | ICD-10-CM | POA: Insufficient documentation

## 2018-03-31 DIAGNOSIS — Z3202 Encounter for pregnancy test, result negative: Secondary | ICD-10-CM

## 2018-03-31 DIAGNOSIS — N939 Abnormal uterine and vaginal bleeding, unspecified: Secondary | ICD-10-CM | POA: Insufficient documentation

## 2018-03-31 LAB — COMPREHENSIVE METABOLIC PANEL
ALBUMIN: 4.3 g/dL (ref 3.5–5.0)
ALT: 22 U/L (ref 0–44)
AST: 23 U/L (ref 15–41)
Alkaline Phosphatase: 58 U/L (ref 38–126)
Anion gap: 5 (ref 5–15)
BUN: 13 mg/dL (ref 6–20)
CHLORIDE: 104 mmol/L (ref 98–111)
CO2: 27 mmol/L (ref 22–32)
CREATININE: 0.66 mg/dL (ref 0.44–1.00)
Calcium: 9.6 mg/dL (ref 8.9–10.3)
GFR calc Af Amer: >60 mL/min (ref >60)
GFR calc non Af Amer: >60 mL/min (ref >60)
Glucose, Bld: 102 mg/dL — ABNORMAL HIGH (ref 70–99)
Potassium: 4.4 mmol/L (ref 3.5–5.1)
Sodium: 136 mmol/L (ref 135–145)
Total Bilirubin: 0.4 mg/dL (ref 0.3–1.2)
Total Protein: 7.2 g/dL (ref 6.5–8.1)

## 2018-03-31 LAB — CBC
HEMATOCRIT: 43.1 % (ref 36.0–46.0)
HEMOGLOBIN: 13.7 g/dL (ref 12.0–15.0)
MCH: 26.8 pg (ref 26.0–34.0)
MCHC: 31.8 g/dL (ref 30.0–36.0)
MCV: 84.2 fL (ref 80.0–100.0)
NRBC: 0 % (ref 0.0–0.2)
Platelets: 296 10*3/uL (ref 150–400)
RBC: 5.12 MIL/uL — ABNORMAL HIGH (ref 3.87–5.11)
RDW: 12.4 % (ref 11.5–15.5)
WBC: 5.3 10*3/uL (ref 4.0–10.5)

## 2018-03-31 LAB — LIPASE, BLOOD: LIPASE: 27 U/L (ref 11–51)

## 2018-03-31 LAB — POCT PREGNANCY, URINE: Preg Test, Ur: NEGATIVE

## 2018-03-31 MED ORDER — ALUM & MAG HYDROXIDE-SIMETH 200-200-20 MG/5ML PO SUSP: 30.0000 mL | Freq: Once | ORAL | Status: DC

## 2018-03-31 MED ORDER — LIDOCAINE VISCOUS HCL 2 % MT SOLN: 15.0000 mL | Freq: Once | OROMUCOSAL | Status: DC

## 2018-03-31 MED ORDER — GI COCKTAIL ~~LOC~~
30.0000 mL | Freq: Once | ORAL | Status: AC
  Administered 2018-03-31: 30 mL via ORAL
  Filled 2018-03-31: qty 30

## 2018-03-31 NOTE — MAU Provider Note (Signed)
Chief Complaint: Vaginal Bleeding and Abdominal Pain   First Provider Initiated Contact with Patient 03/31/18 2118     SUBJECTIVE HPI: Amanda Ellison is a 27 y.o. non pregnant female who presents to Maternity Admissions reporting irregular vaginal bleeding. Reports have 2 episodes of bleeding in the last month. Currently not bleeding. For the last year has had monthly menses that lasted for 3 days. Summer of 2018 was evaluated for AUB d/t 2-3 bleeding episodes per month. Did not keep her f/u appointments or ultrasound appointment. At that time had labs done but the office couldn't get in touch with her to discussed results.  Also reports epigastric pain for the last week. Pain is constant. Has history of gallstones during her last pregnancy.  Denies n/v/d, fever/chills.   Location: epigastric  Quality: aching Severity: 5/10 on pain scale Duration: 1 week Timing: constant Modifying factors: nothing makes better or worse. Hasn't treated symptoms Associated signs and symptoms: none  Past Medical History:  Diagnosis Date  . Cholelithiasis affecting pregnancy, antepartum   . Gestational diabetes   . Hypertension    OB History  Gravida Para Term Preterm AB Living  1 1 1     1   SAB TAB Ectopic Multiple Live Births        0 1    # Outcome Date GA Lbr Len/2nd Weight Sex Delivery Anes PTL Lv  1 Term 02/09/15 [redacted]w[redacted]d 02:21 / 00:51 3425 g F Vag-Spont EPI  LIV   History reviewed. No pertinent surgical history. Social History   Socioeconomic History  . Marital status: Married    Spouse name: Not on file  . Number of children: Not on file  . Years of education: Not on file  . Highest education level: Not on file  Occupational History  . Not on file  Social Needs  . Financial resource strain: Not on file  . Food insecurity:    Worry: Not on file    Inability: Not on file  . Transportation needs:    Medical: Not on file    Non-medical: Not on file  Tobacco Use  . Smoking status:  Never Smoker  . Smokeless tobacco: Never Used  Substance and Sexual Activity  . Alcohol use: No  . Drug use: No  . Sexual activity: Yes    Birth control/protection: None  Lifestyle  . Physical activity:    Days per week: Not on file    Minutes per session: Not on file  . Stress: Not on file  Relationships  . Social connections:    Talks on phone: Not on file    Gets together: Not on file    Attends religious service: Not on file    Active member of club or organization: Not on file    Attends meetings of clubs or organizations: Not on file    Relationship status: Not on file  . Intimate partner violence:    Fear of current or ex partner: Not on file    Emotionally abused: Not on file    Physically abused: Not on file    Forced sexual activity: Not on file  Other Topics Concern  . Not on file  Social History Narrative  . Not on file   Family History  Problem Relation Age of Onset  . Alcohol abuse Neg Hx   . Arthritis Neg Hx   . Asthma Neg Hx   . Diabetes Neg Hx   . Drug abuse Neg Hx   . Early death  Neg Hx   . Birth defects Neg Hx   . Cancer Neg Hx   . COPD Neg Hx   . Depression Neg Hx   . Hearing loss Neg Hx   . Heart disease Neg Hx   . Hyperlipidemia Neg Hx   . Hypertension Neg Hx   . Kidney disease Neg Hx   . Learning disabilities Neg Hx   . Mental illness Neg Hx   . Mental retardation Neg Hx   . Miscarriages / Stillbirths Neg Hx   . Stroke Neg Hx   . Vision loss Neg Hx   . Varicose Veins Neg Hx    No current facility-administered medications on file prior to encounter.    No current outpatient medications on file prior to encounter.   No Known Allergies  I have reviewed patient's Past Medical Hx, Surgical Hx, Family Hx, Social Hx, medications and allergies.   Review of Systems  Constitutional: Negative.   Gastrointestinal: Positive for abdominal pain. Negative for constipation, diarrhea, nausea and vomiting.  Genitourinary: Positive for menstrual  problem. Negative for dysuria, vaginal bleeding and vaginal discharge.    OBJECTIVE Patient Vitals for the past 24 hrs:  BP Temp Temp src Pulse Resp SpO2 Weight  03/31/18 2237 - - - - - - 97.1 kg  03/31/18 2232 114/68 - - 60 18 - -  03/31/18 2100 (!) 145/85 99.2 F (37.3 C) Oral 69 18 100 % -   Constitutional: Well-developed, well-nourished female in no acute distress.  Cardiovascular: normal rate & rhythm, no murmur Respiratory: normal rate and effort. Lung sounds clear throughout GI: TTP in mid epigastric area. Negative murphy's signAbd soft, Pos BS x 4. No guarding or rebound tenderness MS: Extremities nontender, no edema, normal ROM Neurologic: Alert and oriented x 4.     LAB RESULTS Results for orders placed or performed during the hospital encounter of 03/31/18 (from the past 24 hour(s))  Pregnancy, urine POC     Status: None   Collection Time: 03/31/18  8:57 PM  Result Value Ref Range   Preg Test, Ur NEGATIVE NEGATIVE  CBC     Status: Abnormal   Collection Time: 03/31/18  9:35 PM  Result Value Ref Range   WBC 5.3 4.0 - 10.5 K/uL   RBC 5.12 (H) 3.87 - 5.11 MIL/uL   Hemoglobin 13.7 12.0 - 15.0 g/dL   HCT 40.943.1 81.136.0 - 91.446.0 %   MCV 84.2 80.0 - 100.0 fL   MCH 26.8 26.0 - 34.0 pg   MCHC 31.8 30.0 - 36.0 g/dL   RDW 78.212.4 95.611.5 - 21.315.5 %   Platelets 296 150 - 400 K/uL   nRBC 0.0 0.0 - 0.2 %  Comprehensive metabolic panel     Status: Abnormal   Collection Time: 03/31/18  9:35 PM  Result Value Ref Range   Sodium 136 135 - 145 mmol/L   Potassium 4.4 3.5 - 5.1 mmol/L   Chloride 104 98 - 111 mmol/L   CO2 27 22 - 32 mmol/L   Glucose, Bld 102 (H) 70 - 99 mg/dL   BUN 13 6 - 20 mg/dL   Creatinine, Ser 0.860.66 0.44 - 1.00 mg/dL   Calcium 9.6 8.9 - 57.810.3 mg/dL   Total Protein 7.2 6.5 - 8.1 g/dL   Albumin 4.3 3.5 - 5.0 g/dL   AST 23 15 - 41 U/L   ALT 22 0 - 44 U/L   Alkaline Phosphatase 58 38 - 126 U/L   Total Bilirubin 0.4 0.3 -  1.2 mg/dL   GFR calc non Af Amer >60 >60 mL/min    GFR calc Af Amer >60 >60 mL/min   Anion gap 5 5 - 15  Lipase, blood     Status: None   Collection Time: 03/31/18  9:35 PM  Result Value Ref Range   Lipase 27 11 - 51 U/L    IMAGING No results found.  MAU COURSE Orders Placed This Encounter  Procedures  . CBC  . Comprehensive metabolic panel  . Lipase, blood  . Pregnancy, urine POC  . Discharge patient   Meds ordered this encounter  Medications  . DISCONTD: alum & mag hydroxide-simeth (MAALOX/MYLANTA) 200-200-20 MG/5ML suspension 30 mL  . DISCONTD: lidocaine (XYLOCAINE) 2 % viscous mouth solution 15 mL  . gi cocktail (Maalox,Lidocaine,Donnatal)    MDM UPT negative Pt without gyn symptoms at this time & declines pelvic exam. Discussed results of thyroid labs from her previous visit & recommend f/u with her PCP -- plans on [facility] care with [facility].   CBC, CMP, lipase & GI cocktail for epigastric pain Pain resolved with medication. Labs WNL  ASSESSMENT 1. Pregnancy examination or test, negative result   2. Abnormal uterine bleeding (AUB)   3. Epigastric pain     PLAN Discharge home in stable condition. F/u with PCP Discussed OTC antacids  Follow-up Information    [facility]. Schedule an appointment as soon as possible for a visit.   Contact information: [address] [place] [address] [phone]         Allergies as of 03/31/2018   No Known Allergies     Medication List    STOP taking these medications   ibuprofen 600 MG tablet Commonly known as:  ADVIL,MOTRIN   labetalol 200 MG tablet Commonly known as:  NORMODYNE   metroNIDAZOLE 500 MG tablet Commonly known as:  FLAGYL   PNV PRENATAL PLUS MULTIVITAMIN 27-1 MG Tabs        Judeth Horn, NP 03/31/2018  10:48 PM

## 2018-03-31 NOTE — Discharge Instructions (Signed)

## 2018-03-31 NOTE — MAU Note (Signed)
Pt reports irregular periods this month.  Pt reports some light bleeding today.  Pt reports upper abd and back pain that she rates 5/10.

## 2018-05-27 ENCOUNTER — Encounter: Payer: Self-pay | Admitting: Internal Medicine

## 2018-05-27 ENCOUNTER — Ambulatory Visit: Payer: Self-pay | Attending: Internal Medicine | Admitting: Internal Medicine

## 2018-05-27 VITALS — BP 120/82 | HR 73 | Temp 98.2°F | Resp 16 | Ht 70.0 in | Wt 222.2 lb

## 2018-05-27 DIAGNOSIS — Z6831 Body mass index (BMI) 31.0-31.9, adult: Secondary | ICD-10-CM

## 2018-05-27 DIAGNOSIS — N926 Irregular menstruation, unspecified: Secondary | ICD-10-CM

## 2018-05-27 DIAGNOSIS — E669 Obesity, unspecified: Secondary | ICD-10-CM

## 2018-05-27 NOTE — Progress Notes (Signed)
Patient ID: Amanda Ellison, female    DOB: 04/18/1991  MRN: [id number]  CC: New Patient (Initial Visit)   Subjective: Amanda Ellison is a 28 y.o. female who presents for new pt visit.  Husband is with her.  Pt from [place].  She speaks [place]  And [place] Her concerns today include:   Pt did not have primary doctor.  Pt c/o irregular menses x 9 mths.  Menses can skip 2-3 mths at a time. Menses last 6 days with light to heavy bleeding Last menses started yesterday.  Last one prior to this was 03/15/2018. Pt is G1P1.  Child is 33 yrs old.  She is wanting to get pregnant She was seen by a gynecologist at [facility] in 2018 for irregular menses.  Hormone levels were normal.  Patient declined hormone pills at that time.  Patient noted to be overweight.  She reports that she tries to eat healthy.  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  Patient Active Problem List   Diagnosis Date Noted  . Abnormal uterine bleeding (AUB) 10/24/2016  . Language barrier to communication 04/24/2016  . Metabolic syndrome 04/24/2016  . Anemia 04/24/2016  . 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   No current facility-administered medications on file prior to visit.     No Known Allergies  Social History   Socioeconomic History  . Marital status: Married    Spouse name: Not on file  . Number of children: Not on file  . Years of education: Not on file  . Highest education level: Not on file  Occupational History  . Not on file  Social Needs  . Financial resource strain: Not on file  . Food insecurity:    Worry: Not on file    Inability: Not on file  . Transportation needs:    Medical: Not on file    Non-medical: Not on file  Tobacco Use  . Smoking status: Never Smoker  .  Smokeless tobacco: Never Used  Substance and Sexual Activity  . Alcohol use: No  . Drug use: No  . Sexual activity: Yes    Birth control/protection: None  Lifestyle  . Physical activity:    Days per week: Not on file    Minutes per session: Not on file  . Stress: Not on file  Relationships  . Social connections:    Talks on phone: Not on file    Gets together: Not on file    Attends religious service: Not on file    Active member of club or organization: Not on file    Attends meetings of clubs or organizations: Not on file    Relationship status: Not on file  . Intimate partner violence:    Fear of current or ex partner: Not on file    Emotionally abused: Not on file    Physically abused: Not on file    Forced sexual activity: Not on file  Other Topics Concern  . Not on file  Social History Narrative  . Not on file    Family History  Problem Relation Age of Onset  . Alcohol abuse Neg Hx   . Arthritis Neg Hx   . Asthma Neg Hx   . Diabetes Neg Hx   . Drug abuse Neg Hx   .  Early death Neg Hx   . Birth defects Neg Hx   . Cancer Neg Hx   . COPD Neg Hx   . Depression Neg Hx   . Hearing loss Neg Hx   . Heart disease Neg Hx   . Hyperlipidemia Neg Hx   . Hypertension Neg Hx   . Kidney disease Neg Hx   . Learning disabilities Neg Hx   . Mental illness Neg Hx   . Mental retardation Neg Hx   . Miscarriages / Stillbirths Neg Hx   . Stroke Neg Hx   . Vision loss Neg Hx   . Varicose Veins Neg Hx     History reviewed. No pertinent surgical history.  ROS: Review of Systems Negative except as above PHYSICAL EXAM: BP 120/82   Pulse 73   Temp 98.2 F (36.8 C) (Oral)   Resp 16   Ht 5\' 10"  (1.778 m)   Wt 222 lb 3.2 oz (100.8 kg)   LMP 05/26/2018   SpO2 99%   BMI 31.88 kg/m   Physical Exam Repeat blood pressure 120/80 General appearance - alert, well appearing, young female and in no distress Mental status - alert, oriented to person, place, and time Chest -  clear to auscultation, no wheezes, rales or rhonchi, symmetric air entry Heart - normal rate, regular rhythm, normal S1, S2, no murmurs, rubs, clicks or gallops Extremities - peripheral pulses normal, no pedal edema, no clubbing or cyanosis  ASSESSMENT AND PLAN:  1. Irregular menses Likely due to anovulatory cycle.  She is not interested in having menses regulated with birth control pills as she is actively trying to get pregnant.  We will check TSH and prolactin level and if these are normal, I told her that she would need to see a fertility specialist - TSH - Prolactin  2. Class 1 obesity without serious comorbidity with body mass index (BMI) of 31.0 to 31.9 in adult, unspecified obesity type Discussed the importance of healthy eating habits.  Advised her to avoid sugary drinks, cut back on white carbohydrates and eat more white meat than red meat.  Encourage her to be physically active and try to get in at least 30 minutes of aerobic activity 3 to 4 days a week - Hemoglobin A1c    Patient was given the opportunity to ask questions.  Patient verbalized understanding of the plan and was able to repeat key elements of the plan.   Orders Placed This Encounter  Procedures  . TSH  . Prolactin  . Hemoglobin A1c     Requested Prescriptions    No prescriptions requested or ordered in this encounter    Return if symptoms worsen or fail to improve.  Jonah Blue, MD, FACP

## 2018-05-27 NOTE — Patient Instructions (Signed)
We will check your hormone levels today.  If they are normal and you are still desired pregnancy, the next step would be to send you to a gynecologist to discuss fertility treatments.

## 2018-05-28 LAB — TSH: TSH: 2.39 u[IU]/mL (ref 0.450–4.500)

## 2018-05-28 LAB — HEMOGLOBIN A1C
Est. average glucose Bld gHb Est-mCnc: 126 mg/dL
Hgb A1c MFr Bld: 6 % — ABNORMAL HIGH (ref 4.8–5.6)

## 2018-05-28 LAB — PROLACTIN: PROLACTIN: 8.1 ng/mL (ref 4.8–23.3)

## 2018-10-13 ENCOUNTER — Encounter: Payer: Self-pay | Admitting: *Deleted

## 2019-05-06 NOTE — L&D Delivery Note (Signed)
Delivery Note Labor onset:   10/26/19 Labor Onset Time: 0100 Complete dilation at 3:50 AM  Onset of pushing at 0404 FHR second stage Cat 1 Analgesia/Anesthesia intrapartum: Epidural  Well-controlled, open-glottis pushing with maternal urge. Delivery of a viable female at 2. Fetal head delivered in LOA position. Nuchal cord none. Lusty cry at birth. Infant placed on maternal abd, dried, and tactile stim.  Cord double clamped and cut after 2 min and cut by CNM. Mother declined offer to cut cord and spouse could not be present.  Cord blood sample collected-yes Arterial cord blood sample- no  Placenta delivered Tomasa Blase, intact, with 2 VC.  Placenta to pathology. Uterine tone boggy, firm w/ massage bleeding brisk, reduced to minimal after Pitocin IV bolus and Cytotec 800 mcg rectally  1st degree laceration identified.  Anesthesia: Epidural Repair: 4-0 Vicryl QBL (mL): 153 Complications: none APGAR: APGAR (1 MIN): 8   APGAR (5 MINS): 9   APGAR (10 MINS):   Mom to postpartum.  Baby to Couplet care / Skin to Skin.  Roma Schanz MSN, CNM 10/26/2019, 5:19 AM

## 2019-08-26 ENCOUNTER — Other Ambulatory Visit (HOSPITAL_COMMUNITY): Payer: Self-pay | Admitting: Obstetrics and Gynecology

## 2019-08-26 DIAGNOSIS — Z363 Encounter for antenatal screening for malformations: Secondary | ICD-10-CM

## 2019-08-26 DIAGNOSIS — Z3A32 32 weeks gestation of pregnancy: Secondary | ICD-10-CM

## 2019-09-13 ENCOUNTER — Other Ambulatory Visit (HOSPITAL_COMMUNITY): Payer: Self-pay

## 2019-09-13 ENCOUNTER — Other Ambulatory Visit (HOSPITAL_COMMUNITY): Payer: Self-pay | Admitting: Obstetrics and Gynecology

## 2019-09-13 DIAGNOSIS — K831 Obstruction of bile duct: Secondary | ICD-10-CM

## 2019-09-13 DIAGNOSIS — Z3A32 32 weeks gestation of pregnancy: Secondary | ICD-10-CM

## 2019-09-13 DIAGNOSIS — Z363 Encounter for antenatal screening for malformations: Secondary | ICD-10-CM

## 2019-09-13 DIAGNOSIS — O283 Abnormal ultrasonic finding on antenatal screening of mother: Secondary | ICD-10-CM

## 2019-09-13 DIAGNOSIS — O26613 Liver and biliary tract disorders in pregnancy, third trimester: Secondary | ICD-10-CM

## 2019-09-15 ENCOUNTER — Ambulatory Visit: Payer: 59

## 2019-09-15 ENCOUNTER — Other Ambulatory Visit (HOSPITAL_COMMUNITY): Payer: Self-pay | Admitting: Obstetrics and Gynecology

## 2019-09-15 ENCOUNTER — Other Ambulatory Visit: Payer: Self-pay | Admitting: *Deleted

## 2019-09-15 ENCOUNTER — Ambulatory Visit (HOSPITAL_COMMUNITY): Payer: 59 | Attending: Obstetrics and Gynecology

## 2019-09-15 ENCOUNTER — Encounter: Payer: Self-pay | Admitting: *Deleted

## 2019-09-15 ENCOUNTER — Other Ambulatory Visit: Payer: Self-pay

## 2019-09-15 ENCOUNTER — Ambulatory Visit: Payer: 59 | Admitting: *Deleted

## 2019-09-15 VITALS — BP 145/80 | HR 93 | Temp 98.2°F

## 2019-09-15 DIAGNOSIS — O283 Abnormal ultrasonic finding on antenatal screening of mother: Secondary | ICD-10-CM | POA: Diagnosis present

## 2019-09-15 DIAGNOSIS — Q270 Congenital absence and hypoplasia of umbilical artery: Secondary | ICD-10-CM | POA: Insufficient documentation

## 2019-09-15 DIAGNOSIS — Z363 Encounter for antenatal screening for malformations: Secondary | ICD-10-CM | POA: Diagnosis present

## 2019-09-15 DIAGNOSIS — O26613 Liver and biliary tract disorders in pregnancy, third trimester: Secondary | ICD-10-CM | POA: Diagnosis present

## 2019-09-15 DIAGNOSIS — K831 Obstruction of bile duct: Secondary | ICD-10-CM

## 2019-09-15 DIAGNOSIS — O35EXX0 Maternal care for other (suspected) fetal abnormality and damage, fetal genitourinary anomalies, not applicable or unspecified: Secondary | ICD-10-CM

## 2019-09-15 DIAGNOSIS — Z3A32 32 weeks gestation of pregnancy: Secondary | ICD-10-CM | POA: Diagnosis present

## 2019-09-15 DIAGNOSIS — Z843 Family history of consanguinity: Secondary | ICD-10-CM

## 2019-09-15 DIAGNOSIS — O359XX0 Maternal care for (suspected) fetal abnormality and damage, unspecified, not applicable or unspecified: Secondary | ICD-10-CM

## 2019-09-15 NOTE — Progress Notes (Signed)
Name: Amanda Ellison MRN: [id number] Referring Provider: Nigel Bridgeman, CNM  [name], G2 P1 at 32w 3d gestation, is here for ultrasound evaluation because of obstetric cholestasis (bile acid levels 9.7). She has itching of hands and the body. She reports she is yet to start her medications prescribed. Her pregnancy is dated by 29-week ultrasound performed at your office. Single umbilical artery was seen at your office scan. She initiated her prenatal care at your office at 28 weeks' gestation. BP today at our office is 145/80 mm Hg. Obstetric history is significant for a term vaginal delivery in 2016 of a female infant. Her pregnancy was complicated by gestational diabetes. On today's ultrasound, amniotic fluid is normal and good fetal activity is seen. Fetal growth is appropriate for gestational age. Antenatal testing is reassuring. BPP 8/8. Following findings are seen:  -Single umbilical artery. 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  Cardiac evaluation was inadequate because of fetal position. I counseled the patient (her husband was on phone) on the following: Abnormal location of kidney: Horse-shoe kidneys can be associated with fetal chromosomal anomalies. Accurate prenatal diagnosis can be difficult and may be evident only on postnatal evaluation. Horse-shoe kidneys can be associated with renal tumors (Wilm's tumor or carcinoid). They are also susceptible to injuries. Because of its association with chromosomal anomalies (with additional finding of single umbilical artery), I discussed amniocentesis for fetal karyotype. I explained the procedure. Patient opted not to have amniocentesis. Differential diagnosis include pelvic kidney, which is usually hypoplastic. I recommended fetal echocardiography. Obstetric cholestasis: I counseled the patient on the diagnosis and treatment. Stillbirth is a major complication that can  occur, and treatment does not always prevent it. Most stillbirths occur after [redacted] weeks gestation.  I discussed treatment with ursodeoxycholic acid that reduces bile acid levels by decreasing the reabsorption of bile acids. Reduction of bile acids give symptomatic relief. However, treatment does not prevent stillbirth. I also informed the patient that antenatal testing (recommended) does not predict fetal compromise in pregnancies complicated by cholestasis.   Timing of delivery: No clear consensus exists on the timing of delivery. To prevent complication of stillbirth, delivery at 37 weeks or even at 36 weeks may be considered if symptoms worsen and bile acids are increased (>40). Recommendations: -We will set up an appointment for fetal echocardiography. -Weekly BPP from now till delivery. -Delivery at 37 weeks. -Postnatal evaluation of newborn's kidneys.  Consultation including face-to-face counseling: 30 minutes.

## 2019-09-16 ENCOUNTER — Encounter: Payer: Self-pay | Admitting: Obstetrics and Gynecology

## 2019-09-22 ENCOUNTER — Ambulatory Visit: Payer: 59 | Admitting: *Deleted

## 2019-09-22 ENCOUNTER — Observation Stay (HOSPITAL_COMMUNITY): Payer: 59

## 2019-09-22 ENCOUNTER — Other Ambulatory Visit: Payer: Self-pay

## 2019-09-22 ENCOUNTER — Ambulatory Visit (HOSPITAL_BASED_OUTPATIENT_CLINIC_OR_DEPARTMENT_OTHER): Payer: 59

## 2019-09-22 ENCOUNTER — Observation Stay (HOSPITAL_COMMUNITY)
Admission: AD | Admit: 2019-09-22 | Discharge: 2019-09-23 | Disposition: A | Discharge status: Home / Self Care | Payer: 59 | Attending: Obstetrics and Gynecology | Admitting: Obstetrics and Gynecology

## 2019-09-22 ENCOUNTER — Observation Stay: Payer: 59

## 2019-09-22 ENCOUNTER — Other Ambulatory Visit: Payer: Self-pay | Admitting: Obstetrics and Gynecology

## 2019-09-22 ENCOUNTER — Encounter (HOSPITAL_COMMUNITY): Payer: Self-pay | Admitting: Obstetrics and Gynecology

## 2019-09-22 VITALS — BP 135/86 | HR 85

## 2019-09-22 DIAGNOSIS — K831 Obstruction of bile duct: Secondary | ICD-10-CM | POA: Diagnosis not present

## 2019-09-22 DIAGNOSIS — O35EXX0 Maternal care for other (suspected) fetal abnormality and damage, fetal genitourinary anomalies, not applicable or unspecified: Secondary | ICD-10-CM

## 2019-09-22 DIAGNOSIS — O368930 Maternal care for other specified fetal problems, third trimester, not applicable or unspecified: Secondary | ICD-10-CM | POA: Diagnosis not present

## 2019-09-22 DIAGNOSIS — Z3A33 33 weeks gestation of pregnancy: Secondary | ICD-10-CM | POA: Insufficient documentation

## 2019-09-22 DIAGNOSIS — O0990 Supervision of high risk pregnancy, unspecified, unspecified trimester: Secondary | ICD-10-CM | POA: Insufficient documentation

## 2019-09-22 DIAGNOSIS — O288 Other abnormal findings on antenatal screening of mother: Secondary | ICD-10-CM | POA: Diagnosis present

## 2019-09-22 DIAGNOSIS — Z843 Family history of consanguinity: Secondary | ICD-10-CM

## 2019-09-22 DIAGNOSIS — O09293 Supervision of pregnancy with other poor reproductive or obstetric history, third trimester: Secondary | ICD-10-CM | POA: Diagnosis not present

## 2019-09-22 DIAGNOSIS — O358XX0 Maternal care for other (suspected) fetal abnormality and damage, not applicable or unspecified: Secondary | ICD-10-CM | POA: Insufficient documentation

## 2019-09-22 DIAGNOSIS — Z20822 Contact with and (suspected) exposure to covid-19: Secondary | ICD-10-CM | POA: Diagnosis not present

## 2019-09-22 DIAGNOSIS — O26893 Other specified pregnancy related conditions, third trimester: Secondary | ICD-10-CM | POA: Diagnosis not present

## 2019-09-22 DIAGNOSIS — O368390 Maternal care for abnormalities of the fetal heart rate or rhythm, unspecified trimester, not applicable or unspecified: Secondary | ICD-10-CM

## 2019-09-22 DIAGNOSIS — Z8632 Personal history of gestational diabetes: Secondary | ICD-10-CM

## 2019-09-22 DIAGNOSIS — O26613 Liver and biliary tract disorders in pregnancy, third trimester: Secondary | ICD-10-CM | POA: Insufficient documentation

## 2019-09-22 DIAGNOSIS — O0933 Supervision of pregnancy with insufficient antenatal care, third trimester: Secondary | ICD-10-CM | POA: Diagnosis not present

## 2019-09-22 DIAGNOSIS — O24419 Gestational diabetes mellitus in pregnancy, unspecified control: Secondary | ICD-10-CM | POA: Diagnosis not present

## 2019-09-22 DIAGNOSIS — Q270 Congenital absence and hypoplasia of umbilical artery: Secondary | ICD-10-CM

## 2019-09-22 DIAGNOSIS — D649 Anemia, unspecified: Secondary | ICD-10-CM

## 2019-09-22 DIAGNOSIS — O289 Unspecified abnormal findings on antenatal screening of mother: Secondary | ICD-10-CM

## 2019-09-22 DIAGNOSIS — O99013 Anemia complicating pregnancy, third trimester: Secondary | ICD-10-CM

## 2019-09-22 LAB — COMPREHENSIVE METABOLIC PANEL
ALT: 25 U/L (ref 0–44)
AST: 29 U/L (ref 15–41)
Albumin: 2.6 g/dL — ABNORMAL LOW (ref 3.5–5.0)
Alkaline Phosphatase: 137 U/L — ABNORMAL HIGH (ref 38–126)
Anion gap: 9 (ref 5–15)
BUN: <5 mg/dL — ABNORMAL LOW (ref 6–20)
CO2: 22 mmol/L (ref 22–32)
Calcium: 9 mg/dL (ref 8.9–10.3)
Chloride: 104 mmol/L (ref 98–111)
Creatinine, Ser: 0.46 mg/dL (ref 0.44–1.00)
GFR calc Af Amer: >60 mL/min (ref >60)
GFR calc non Af Amer: >60 mL/min (ref >60)
Glucose, Bld: 108 mg/dL — ABNORMAL HIGH (ref 70–99)
Potassium: 3.9 mmol/L (ref 3.5–5.1)
Sodium: 135 mmol/L (ref 135–145)
Total Bilirubin: 0.6 mg/dL (ref 0.3–1.2)
Total Protein: 6.2 g/dL — ABNORMAL LOW (ref 6.5–8.1)

## 2019-09-22 LAB — CBC
HCT: 36.8 % (ref 36.0–46.0)
Hemoglobin: 11.2 g/dL — ABNORMAL LOW (ref 12.0–15.0)
MCH: 24.9 pg — ABNORMAL LOW (ref 26.0–34.0)
MCHC: 30.4 g/dL (ref 30.0–36.0)
MCV: 81.8 fL (ref 80.0–100.0)
Platelets: 264 10*3/uL (ref 150–400)
RBC: 4.5 MIL/uL (ref 3.87–5.11)
RDW: 14.5 % (ref 11.5–15.5)
WBC: 5.9 10*3/uL (ref 4.0–10.5)
nRBC: 0 % (ref 0.0–0.2)

## 2019-09-22 LAB — GLUCOSE, CAPILLARY
Glucose-Capillary: 87 mg/dL (ref 70–99)
Glucose-Capillary: 117 mg/dL — ABNORMAL HIGH (ref 70–99)

## 2019-09-22 LAB — SARS CORONAVIRUS 2 BY RT PCR (HOSPITAL ORDER, PERFORMED IN ~~LOC~~ HOSPITAL LAB): SARS Coronavirus 2: NEGATIVE

## 2019-09-22 LAB — TYPE AND SCREEN
ABO/RH(D): O NEG
Antibody Screen: NEGATIVE

## 2019-09-22 LAB — ABO/RH: ABO/RH(D): O NEG

## 2019-09-22 MED ORDER — PRENATAL MULTIVITAMIN CH
1.0000 | ORAL_TABLET | Freq: Every day | ORAL | Status: DC
  Administered 2019-09-22: 1 via ORAL
  Filled 2019-09-22: qty 1

## 2019-09-22 MED ORDER — SODIUM CHLORIDE 0.9 % IV SOLN: 250.0000 mL | INTRAVENOUS | Status: DC | PRN

## 2019-09-22 MED ORDER — SODIUM CHLORIDE 0.9% FLUSH
3.0000 mL | Freq: Two times a day (BID) | INTRAVENOUS | Status: DC
  Administered 2019-09-22 – 2019-09-23 (×2): 3 mL via INTRAVENOUS

## 2019-09-22 MED ORDER — ZOLPIDEM TARTRATE 5 MG PO TABS: 5.0000 mg | ORAL_TABLET | Freq: Every evening | ORAL | Status: DC | PRN

## 2019-09-22 MED ORDER — CALCIUM CARBONATE ANTACID 500 MG PO CHEW: 2.0000 | CHEWABLE_TABLET | ORAL | Status: DC | PRN

## 2019-09-22 MED ORDER — SODIUM CHLORIDE 0.9% FLUSH: 3.0000 mL | INTRAVENOUS | Status: DC | PRN

## 2019-09-22 MED ORDER — DOCUSATE SODIUM 100 MG PO CAPS
100.0000 mg | ORAL_CAPSULE | Freq: Every day | ORAL | Status: DC
  Administered 2019-09-23: 100 mg via ORAL
  Filled 2019-09-22: qty 1

## 2019-09-22 MED ORDER — ACETAMINOPHEN 325 MG PO TABS: 650.0000 mg | ORAL_TABLET | ORAL | Status: DC | PRN

## 2019-09-22 MED ORDER — RHO D IMMUNE GLOBULIN 1500 UNIT/2ML IJ SOSY
300.0000 ug | PREFILLED_SYRINGE | Freq: Once | INTRAMUSCULAR | Status: AC
  Administered 2019-09-22: 300 ug via INTRAMUSCULAR
  Filled 2019-09-22: qty 2

## 2019-09-22 NOTE — MAU Note (Addendum)
Pt sent from office for extended fetal monitoring d/t failed BPP and non-reactive NST. Pt speaks [place]. Interpreter machine in use, so could not finish questions in triage. RN will do assessment in room.   Endorses fetal movement. Denies cntrx, bleeding or LOF

## 2019-09-22 NOTE — Procedures (Signed)
Amanda Ellison Feb 13, 1991 [redacted]w[redacted]d  Fetus A Non-Stress Test Interpretation for 09/22/19  Indication: Unsatisfactory BPP  Fetal Heart Rate A Mode: External Baseline Rate (A): 150 bpm Variability: Moderate Accelerations: 10 x 10 Decelerations: None Multiple birth?: No  Uterine Activity Mode: Toco Contraction Frequency (min): Occ UC noted Contraction Duration (sec): 60 Contraction Quality: Mild Resting Tone Palpated: Relaxed Resting Time: Adequate  Interpretation (Fetal Testing) Nonstress Test Interpretation: Non-reactive Comments: FHR tracing rev'd by Dr. Parke Poisson, pt instructed to go to MAU for extended fetal monitoring

## 2019-09-22 NOTE — Plan of Care (Signed)
  Problem: Clinical Measurements: Goal: Complications related to the disease process, condition or treatment will be avoided or minimized Outcome: Progressing  BPP tomorrow.

## 2019-09-22 NOTE — Progress Notes (Unsigned)
Report called to [facility], charge RN at MAU.  Pt here today with failed BPP and NST.  8/10-counting off for breathing. Pt reports good FM and very active during monitoring. Dr. Parke Poisson sending for extended monitoring. [place] interp. Used to translate for Pt. Pt verbalized understanding.

## 2019-09-22 NOTE — H&P (Signed)
OB ADMISSION/ HISTORY & PHYSICAL:  Admission Date: 09/22/2019 10:20 AM  Admit Diagnosis: Equivocal non-stress test [O28.8]    Amanda Ellison is a 29 y.o. female presenting for BPP of 6/10 at MFM today. Pregnancy is complicated by GDM, single umbilical artery, fetal renal anomaly, and cholestasis of pregnancy. Pt had late entry to prenatal care at 29 weeks. Pt and husband are first cousins. Was seen today by MFM for BPP that was 6/10, 2 off for breathing and non-reactive NST. Pt was sent for extended monitoring in MAU where she continued to have a non-reactive NST. She is being admitted for observation. Pt denies contractions, leaking of fluid, or vaginal bleeding. Endorses + fetal movement. Pt speaks [place] and uses step daughter for interpretation.   Prenatal History: G2P1001   EDC : 11/07/2019, by Ultrasound  Prenatal care at [facility] since 29 weeks  Prenatal course complicated by: 1. Cholestasis of pregnancy- Bile acids 9.7 at 30 weeks--, refer to MFM, implement antenatal testing. 2. Single vessel of umbilical cord - Single umbilical artery, follow growth q 4 weeks.  Plan fetal echo. 3. 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  Plan postnatal evaluation. 4. RhD negative - rhogam at 30 weeks/prn 5. History of gestational diabetes mellitus  6. 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  Had genetic counseling last pregnancy, declines this pregnancy 7. Late entry into prenatal care - 29 weeks by size. 8. Language difficulty - Husband or step-daughter as Nurse, learning disability, from [place], speaks [place].  Patient does speak some [place]. 9. History of cholestasis in pregnancy - 2016, induced at 37 weeks. 10. History of gestational hypertension - 2017 pregnancy, on Labetalol during pregnancy, did not take, recommend ASA. PIH labs/PCR at NOB.  Prenatal Labs: ABO, Rh:   O NEG Antibody: NEG (05/20 1236) Rubella:    Immune RPR:   Non-reactive HBsAg:   Negative HIV:   Negative GBS:   ??? 1 hr Glucola : 190 Genetic Screening: Low risk female Ultrasound: Followed by MFM    Maternal Diabetes: Yes:  Diabetes Type:  Diet controlled Genetic Screening: Normal Maternal Ultrasounds/Referrals: Fetal Kidney Anomalies Fetal Ultrasounds or other Referrals:  Fetal echo Maternal Substance Abuse:  No Significant Maternal Medications:  None Significant Maternal Lab Results:  RH negative Other Comments:  Late prenatal care  Medical / Surgical History :  Past medical history:  Past Medical History:  Diagnosis Date  . Cholelithiasis affecting pregnancy, antepartum   . Gestational diabetes   . Hypertension     Past surgical history: History reviewed. No pertinent surgical history.   Family History:  Family History  Problem Relation Age of Onset  . Alcohol abuse Neg Hx   . Arthritis Neg Hx   . Asthma Neg Hx   . Diabetes Neg Hx   . Drug abuse Neg Hx   . Early death Neg Hx   . Birth defects Neg Hx   . Cancer Neg Hx   . COPD Neg Hx   . Depression Neg Hx   . Hearing loss Neg Hx   . Heart disease Neg Hx   . Hyperlipidemia Neg Hx   . Hypertension Neg Hx   . Kidney disease Neg Hx   . Learning disabilities Neg Hx   . Mental illness Neg Hx   . Mental retardation Neg Hx   . Miscarriages / Stillbirths Neg Hx   . Stroke Neg Hx   .  Vision loss Neg Hx   . Varicose Veins Neg Hx     Social History:  reports that she has never smoked. She has never used smokeless tobacco. She reports that she does not drink alcohol or use drugs.  Allergies: Patient has no known allergies.   Current Medications at time of admission:  Medications Prior to Admission  Medication Sig Dispense Refill Last Dose  . Prenatal Vit-Fe Fumarate-FA (PRENATAL VITAMIN PO) Take by mouth.   Past Month at Unknown time    Review of Systems: Review of Systems  Constitutional: Negative for chills and fever.  HENT: Negative for congestion  and sore throat.   Eyes: Negative for blurred vision and photophobia.  Respiratory: Negative for cough and shortness of breath.   Cardiovascular: Negative for chest pain and leg swelling.  Gastrointestinal: Positive for heartburn. Negative for nausea and vomiting.  Genitourinary: Negative for dysuria, frequency and urgency.  Musculoskeletal: Negative for back pain and falls.  Skin: Positive for itching.  Neurological: Negative for dizziness and headaches.  Psychiatric/Behavioral: Negative for depression. The patient is not nervous/anxious.    Physical Exam: Vital signs and nursing notes reviewed.  ED Triage Vitals [09/22/19 1034]  Enc Vitals Group     BP 138/79     Pulse Rate 94     Resp 18     Temp 98.7 F (37.1 C)     Temp Source Oral     SpO2 100 %     Weight 212 lb (96.2 kg)     Height 5\' 9"  (1.753 m)     Head Circumference      Peak Flow      Pain Score      Pain Loc      Pain Edu?      Excl. in [place]?     General: AAO x 3, NAD Heart: RRR Lungs:CTAB Abdomen: Gravid, NT Extremities: no edema  FHR: 150BPM, mod variability, + accels, occasional variable decels TOCO: Ctx occasional  Labs:   Pending T&S, CBC, RPR  Recent Labs    09/22/19 1236  WBC 5.9  HGB 11.2*  HCT 36.8  PLT 264   Assessment:  29 y.o. G2P1001 at [redacted]w[redacted]d admitted for observation due to abnormal fetal testing at MFM. BPP was 6/10 (2 off for breathing and non-reactive NST).   1. GDM 2. Fetal renal anomaly 3. Single umbilical artery 4. Cholestasis of pregnancy 5. Equivocal fetal testing  Plan:  1. Admit to Antepartum 2. Continuous fetal monitoring 3. Fasting and 2 hour PP blood sugars 4. Diabetic diet 5. SCDs while in bed 6. Repeat BPP in the morning 7. CBC, CMP, and bile acids 8. Give Rhophylac for O Neg, verified that it was not given in office  Dr. Charlesetta Garibaldi notified of admission / plan of care  [place], MSN 09/22/2019, 2:37 PM

## 2019-09-22 NOTE — Progress Notes (Signed)
Pt stable and has no c/os BP 133/82 (BP Location: Left Arm)   Pulse 92   Temp 98.7 F (37.1 C) (Oral)   Resp 18   Ht 5\' 9"  (1.753 m)   Wt 96.2 kg   LMP  (LMP Unknown)   SpO2 100%   BMI 31.31 kg/m  Cat 1 tacing Continue plan of care

## 2019-09-22 NOTE — MAU Provider Note (Signed)
History     CSN: [id number]  Arrival date and time: 09/22/19 1020   First Provider Initiated Contact with Patient 09/22/19 1139      Chief Complaint  Patient presents with  . Non-stress Test   Ms. Amanda Ellison is a 29 y.o. G2P1001 at [redacted]w[redacted]d who presents to MAU for extended monitoring after she was seen at [facility] today with 6/8 BPP (-2 for breathing) and NRNST. Patient reports it was a routine appointment for cholestasis and 2VC. Patient denies any concerns.  Pt denies VB, LOF, ctx, decreased FM, vaginal discharge/odor/itching. Pt denies N/V, abdominal pain, constipation, diarrhea, or urinary problems. Pt denies fever, chills, fatigue, sweating or changes in appetite. Pt denies SOB or chest pain. Pt denies dizziness, HA, light-headedness, weakness.  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 Allergies? NKDA Current medications/supplements? PNVs Prenatal care provider? [facility], next appt 09/29/2019  Translation tablet brought to room, but patient reports she prefers her daughter, present at bedside, to translate.   OB History    Gravida  2   Para  1   Term  1   Preterm      AB      Living  1     SAB      TAB      Ectopic      Multiple  0   Live Births  1           Past Medical History:  Diagnosis Date  . Cholelithiasis affecting pregnancy, antepartum   . Gestational diabetes   . Hypertension     History reviewed. No pertinent surgical history.  Family History  Problem Relation Age of Onset  . Alcohol abuse Neg Hx   . Arthritis Neg Hx   . Asthma Neg Hx   . Diabetes Neg Hx   . Drug abuse Neg Hx   . Early death Neg Hx   . Birth defects Neg Hx   . Cancer Neg Hx   . COPD Neg Hx   . Depression Neg Hx   . Hearing loss Neg Hx   . Heart disease Neg Hx   . Hyperlipidemia Neg Hx   . Hypertension Neg Hx   . Kidney disease Neg Hx   . Learning disabilities Neg Hx   . Mental illness Neg Hx   . Mental retardation Neg Hx   . Miscarriages /  Stillbirths Neg Hx   . Stroke Neg Hx   . Vision loss Neg Hx   . Varicose Veins Neg Hx     Social History   Tobacco Use  . Smoking status: Never Smoker  . Smokeless tobacco: Never Used  Substance Use Topics  . Alcohol use: No  . Drug use: No    Allergies: No Known Allergies  Medications Prior to Admission  Medication Sig Dispense Refill Last Dose  . Prenatal Vit-Fe Fumarate-FA (PRENATAL VITAMIN PO) Take by mouth.   Past Month at Unknown time    Review of Systems  Constitutional: Negative for chills, diaphoresis, fatigue and fever.  Eyes: Negative for visual disturbance.  Respiratory: Negative for shortness of breath.   Cardiovascular: Negative for chest pain.  Gastrointestinal: Negative for abdominal pain, constipation, diarrhea, nausea and vomiting.  Genitourinary: Negative for dysuria, flank pain, frequency, pelvic pain, urgency, vaginal bleeding and vaginal discharge.  Neurological: Negative for dizziness, weakness, light-headedness and headaches.   Physical Exam   Blood pressure 120/66, pulse 89, temperature 98.7 F (37.1 C), temperature source Oral, resp. rate 18, height 5\' 9"  (  1.753 m), weight 96.2 kg, SpO2 100 %, currently breastfeeding.  Patient Vitals for the past 24 hrs:  BP Temp Temp src Pulse Resp SpO2 Height Weight  09/22/19 1111 120/66 -- -- 89 -- -- -- --  09/22/19 1034 138/79 98.7 F (37.1 C) Oral 94 18 100 %  (1.753 m) 96.2 kg   Physical Exam  Constitutional: She is oriented to person, place, and time. She appears well-developed and well-nourished. No distress.  Respiratory: Effort normal.  Neurological: She is alert and oriented to person, place, and time.  Skin: She is not diaphoretic.  Psychiatric: She has a normal mood and affect. Her behavior is normal. Judgment and thought content normal.   No results found for this or any previous visit (from the past 24 hour(s)).  [place] MFM FETAL BPP W/NONSTRESS  Result Date:  09/22/2019 ----------------------------------------------------------------------  OBSTETRICS REPORT                       (Signed Final 09/22/2019 11:18 am) ---------------------------------------------------------------------- Patient Info  ID #:       [id number]                          D.O.B.:  1991-04-15 (28 yrs)  Name:       Amanda Ellison              Visit Date: 09/22/2019 08:27 am ---------------------------------------------------------------------- Performed By  Attending:        Ma Rings MD         Ref. Address:     [facility] &                                                             Gynecology                                                             [address]                                                             [address]  [place], [place]                                                             [address]  Performed By:     Emeline Darling BS,      Location:         [facility]                    RD[facility]                                     Fetal Care  Referred By:      Nigel Bridgeman                    CNM ---------------------------------------------------------------------- Orders  #  Description                           Code        Ordered By  1  [place] MFM FETAL BPP                      60454.0     [facility]     W/NONSTRESS ----------------------------------------------------------------------  #  Order #                     Accession #                Episode #  1  [id number]                   [id number]                 [id number] ---------------------------------------------------------------------- Indications  Cholestasis of pregnancy, third trimester      B28.413K44.0  (bile acids 9.7 at 30 wks)  [redacted] weeks gestation of pregnancy                Z3A.33  Abnormal finding on antenatal  screening        O28.9  Consanguinity                                  Z84.3  Poor obstetric history: Previous gestational   O09.299  diabetes  Anemia during pregnancy in third trimester     O99.013  2 vessel umbilical cord                        O69.89X0  Fetal renal anomaly, unspecified fetus         O35.8XX0 ---------------------------------------------------------------------- Fetal Evaluation  Num Of Fetuses:         1  Fetal Heart Rate(bpm):  143  Cardiac Activity:       Observed  Presentation:           Cephalic  Placenta:               Anterior  P. Cord Insertion:      Previously Visualized  Amniotic Fluid  AFI FV:      Within normal limits  AFI Sum(cm)     %  Tile       Largest Pocket(cm)  17.3            63          5.7  RUQ(cm)       RLQ(cm)       LUQ(cm)        LLQ(cm)  4.8           1.5           5.3            5.7 ---------------------------------------------------------------------- Biophysical Evaluation  Amniotic F.V:   Pocket => 2 cm             F. Tone:        Observed  F. Movement:    Observed                   Score:          6/8  F. Breathing:   Not Observed ---------------------------------------------------------------------- OB History  Gravidity:    2         Term:   1        Prem:   0        SAB:   0  TOP:          0       Ectopic:  0        Living: 1 ---------------------------------------------------------------------- Gestational Age  Best:          33w 3d     Det. 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  She denies any problems  since her last exam.  A biophysical profile performed today was 6 out of 8.  She  received a -2 for fetal breathing movements that did not meet  criteria.  The patient subsequently had  a nonreactive  nonstress test in our office.  There was normal amniotic fluid noted on today's ultrasound  exam.  Due to a biophysical profile that was 6 out of 10 today, the  patient was sent immediately to the MAU following today's  ultrasound exam for prolonged monitoring.  Another biophysical profile was scheduled in our office in 1  week.  All conversations were held with the patient today with the  help of a [place] interpreter. ----------------------------------------------------------------------                   Ma Rings, MD Electronically Signed Final Report   09/22/2019 11:18 am ----------------------------------------------------------------------  [place] MFM FETAL BPP WO NON STRESS  Result Date: 09/15/2019 ----------------------------------------------------------------------  OBSTETRICS REPORT                       (Signed Final 09/15/2019 02:44 pm) ---------------------------------------------------------------------- Patient Info  ID #:       [id number]                          D.O.B.:  09-19-1990 (28 yrs)  Name:       Amanda Ellison  Visit Date: 09/15/2019 10:04 am ---------------------------------------------------------------------- Performed By  Attending:        Noralee Space MD        Ref. Address:     [facility] &                                                             Gynecology                                                             [address]                                                             [address]                                                             [place], [place]                                                             [address]  Performed By:     Eden Lathe BS      Location:         [facility]                    RDMS RVT                                 Fetal Care  Referred By:      Nigel Bridgeman                    CNM ---------------------------------------------------------------------- Orders  #  Description  Code        Ordered By  1  US MFM OB DETAIL +14 WK               [id number]    Nigel BridgemanVICKI LATHAM  2  US MFM FETAL BPP WO NON               [id number]    VICKI LATHAM     STRESS ----------------------------------------------------------------------  #  Order #                     Accession #                Episode #  1  [id number]                   [id number]                 [id number]  2  [id number]                   [id number]                 [id number] ---------------------------------------------------------------------- Indications  [redacted] weeks gestation of pregnancy                Z3A.32  Antenatal screening for malformations          Z36.3  Cholestasis of pregnancy, third trimester      G64.403K74.2O26.613K83.1  (bile acids 9.7 at 30 wks)  Fetal abnormality - other known or             O35.9XX0  suspected (2VC)  Consanguinity                                  Z84.3  Poor obstetric history: Previous gestational   O09.299  diabetes ---------------------------------------------------------------------- Fetal Evaluation  Num Of Fetuses:         1  Fetal Heart Rate(bpm):  169  Cardiac Activity:       Observed  Presentation:           Cephalic  Placenta:               Anterior  P. Cord Insertion:      Visualized  Amniotic Fluid  AFI FV:      Within normal limits  AFI Sum(cm)     %Tile       Largest Pocket(cm)  18.9            71          5.5  RUQ(cm)       RLQ(cm)       LUQ(cm)        LLQ(cm)  5.5           4.1           5.4            3.9 ---------------------------------------------------------------------- Biophysical Evaluation  Amniotic F.V:   Within normal limits       F. Tone:        Observed  F. Movement:    Observed                   Score:          8/8  F. Breathing:   Observed ---------------------------------------------------------------------- Biometry  BPD:      80.2  mm     G. Age:  32w 1d         35  %    CI:        69.96   %    70 - 86                                                          FL/HC:      19.0   %    19.1 - 21.3  HC:      305.9  mm     G. Age:  34w 0d         57  %    HC/AC:      1.02        0.96 - 1.17  AC:      298.6  mm     G. Age:  33w 6d         86  %    FL/BPD:     72.3   %    71 - 87  FL:         58  mm     G. Age:  30w 2d          3  %    FL/AC:      19.4   %    20 - 24  HUM:      54.9  mm     G. Age:  32w 0d         44  %  Est. FW:    2033  gm      4 lb 8 oz     49  % ---------------------------------------------------------------------- OB History  Gravidity:    2         Term:   1        Prem:   0        SAB:   0  TOP:          0       Ectopic:  0        Living: 1 ---------------------------------------------------------------------- Gestational Age  U/S Today:     32w 4d                                        EDD:   11/06/19  Best:          Armida Sans 3d     Det. ByMarcella Dubs         EDD:   11/07/19                                      (08/25/19) ---------------------------------------------------------------------- Anatomy  Cranium:               Appears normal         LVOT:                   Not well visualized  Cavum:                 Appears normal         Aortic Arch:  Not well visualized  Ventricles:            Appears normal         Ductal Arch:            Not well visualized  Choroid Plexus:        Appears normal         Diaphragm:              Not well visualized  Cerebellum:            Appears normal         Stomach:                Appears normal, left                                                                        sided  Posterior Fossa:       Appears normal         Abdomen:                Appears normal  Nuchal Fold:           Not applicable (>20    Abdominal Wall:         Appears nml (cord                         wks GA)                                        insert, abd wall)  Face:                  Appears normal         Cord  Vessels:           2 vessel cord,                         (orbits and profile)                                                                        absent left umb art  Lips:                  Not well visualized    Kidneys:                Horseshoe vs left                                                                        pelvic kidney  Palate:  Not well visualized    Bladder:                Appears normal  Thoracic:              Appears normal         Spine:                  Not well visualized  Heart:                 Not well visualized    Upper Extremities:      Visualized  RVOT:                  Appears normal         Lower Extremities:      Appears normal  Other:  Heels visualized. Nasal bone visualized. ---------------------------------------------------------------------- Cervix Uterus Adnexa  Cervix  Not visualized (advanced GA >24wks)  Uterus  No abnormality visualized.  Right Ovary  Within normal limits.  Left Ovary  Within normal limits.  Cul De Sac  No free fluid seen.  Adnexa  No abnormality visualized. ---------------------------------------------------------------------- Impression  On today's ultrasound, amniotic fluid is normal and good fetal  activity is seen. Fetal growth is appropriate for gestational  age. Antenatal testing is reassuring. BPP 8/8.  Following findings are seen:  -Single umbilical artery.  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  Cardiac evaluation was inadequate because of fetal position.  Consultation Note (EPIC)  Ms. Hemme, G2 P1 at 32w 3d gestation, is here for  ultrasound evaluation because of obstetric cholestasis (bile  acid levels 9.7). She has itching of hands and the body. She  reports she is yet to start her medications prescribed.  Her pregnancy is dated by 29-week ultrasound performed at  your office. Single umbilical artery was seen at your office  scan.  She initiated her prenatal  care at your office at 28 weeks'  gestation.  BP today at our office is 145/80 mm Hg.  Obstetric history is significant for a term vaginal delivery in  2016 of a female infant. Her pregnancy was complicated by  gestational diabetes.  I counseled the patient (her husband was on phone) on the  following:  Abnormal location of kidney:  Horse-shoe kidneys can be associated with fetal  chromosomal anomalies. Accurate prenatal diagnosis can be  difficult and may be evident only on postnatal evaluation.  Horse-shoe kidneys can be associated with renal tumors  (Wilm's tumor or carcinoid). They are also susceptible to  injuries.  Because of its association with chromosomal anomalies (with  additional finding of single umbilical artery), I discussed  amniocentesis for fetal karyotype. I explained the procedure.  Patient opted not to have amniocentesis.  Differential diagnosis include pelvic kidney, which is usually  hypoplastic.  I recommended fetal echocardiography.  Obstetric cholestasis: I counseled the patient on the  diagnosis and treatment. Stillbirth is a major complication that  can occur, and treatment does not always prevent it. Most  stillbirths occur after [redacted] weeks gestation.   I discussed treatment with ursodeoxycholic acid that reduces  bile acid levels by decreasing the reabsorption of bile acids.  Reduction of bile acids give symptomatic relief. However,  treatment does not prevent stillbirth.  I also informed the patient that antenatal testing  (recommended) does not predict fetal compromise in  pregnancies complicated by cholestasis.  Timing of delivery: No clear consensus exists  on the timing of  delivery. To prevent complication of stillbirth, delivery at 37  weeks or even at 36 weeks may be considered if symptoms  worsen and bile acids are increased (>40). ---------------------------------------------------------------------- Recommendations  -We will set up an appointment for fetal echocardiography.   -Weekly BPP from now till delivery.  -Delivery at 37 weeks.  -Postnatal evaluation of newborn's kidneys. ----------------------------------------------------------------------                  Noralee Space, MD Electronically Signed Final Report   09/15/2019 02:44 pm ----------------------------------------------------------------------  [place] MFM OB DETAIL +14 WK  Result Date: 09/15/2019 ----------------------------------------------------------------------  OBSTETRICS REPORT                       (Signed Final 09/15/2019 02:44 pm) ---------------------------------------------------------------------- Patient Info  ID #:       [id number]                          D.O.B.:  [birth year] (28 yrs)  Name:       Amanda Ellison              Visit Date: 09/15/2019 10:04 am ---------------------------------------------------------------------- Performed By  Attending:        Noralee Space MD        Ref. Address:     [facility] &                                                             Gynecology                                                             [address]                                                             [address]  [place], [place]                                                             [address]  Performed By:     Eden Lathe BS      Location:         [facility]                    RD[facility] RVT                                 Fetal Care  Referred By:      Nigel Bridgeman                    CNM ---------------------------------------------------------------------- Orders  #  Description                           Code        Ordered By  1  [place] MFM OB DETAIL +14 WK               76811.01    VICKI LATHAM  2  [place] MFM FETAL BPP WO NON               76819.01    [facility] LATHAM     STRESS  ----------------------------------------------------------------------  #  Order #                     Accession #                Episode #  1  [id number]                   [id number]                 [id number]  2  [id number]                   [id number]                 [id number] ---------------------------------------------------------------------- Indications  [redacted] weeks gestation of pregnancy                Z3A.32  Antenatal screening for malformations          Z36.3  Cholestasis of pregnancy, third trimester      B01.751W25.8  (bile acids 9.7 at 30 wks)  Fetal abnormality - other known or             O35.9XX0  suspected (2VC)  Consanguinity                                  Z84.3  Poor obstetric history: Previous gestational   O09.299  diabetes ---------------------------------------------------------------------- Fetal Evaluation  Num Of Fetuses:         1  Fetal Heart Rate(bpm):  169  Cardiac Activity:       Observed  Presentation:           Cephalic  Placenta:               Anterior  P. Cord Insertion:  Visualized  Amniotic Fluid  AFI FV:      Within normal limits  AFI Sum(cm)     %Tile       Largest Pocket(cm)  18.9            71          5.5  RUQ(cm)       RLQ(cm)       LUQ(cm)        LLQ(cm)  5.5           4.1           5.4            3.9 ---------------------------------------------------------------------- Biophysical Evaluation  Amniotic F.V:   Within normal limits       F. Tone:        Observed  F. Movement:    Observed                   Score:          8/8  F. Breathing:   Observed ---------------------------------------------------------------------- Biometry  BPD:      80.2  mm     G. Age:  32w 1d         35  %    CI:        69.96   %    70 - 86                                                          FL/HC:      19.0   %    19.1 - 21.3  HC:      305.9  mm     G. Age:  34w 0d         57  %    HC/AC:      1.02        0.96 - 1.17  AC:      298.6  mm     G. Age:  33w 6d         86  %    FL/BPD:     72.3    %    71 - 87  FL:         58  mm     G. Age:  30w 2d          3  %    FL/AC:      19.4   %    20 - 24  HUM:      54.9  mm     G. Age:  32w 0d         44  %  Est. FW:    2033  gm      4 lb 8 oz     49  % ---------------------------------------------------------------------- OB History  Gravidity:    2         Term:   1        Prem:   0        SAB:   0  TOP:          0       Ectopic:  0        Living: 1 ---------------------------------------------------------------------- Gestational Age  U/S Today:  32w 4d                                        EDD:   11/06/19  Best:          Armida Sans 3d     Det. ByMarcella Dubs         EDD:   11/07/19                                      (08/25/19) ---------------------------------------------------------------------- Anatomy  Cranium:               Appears normal         LVOT:                   Not well visualized  Cavum:                 Appears normal         Aortic Arch:            Not well visualized  Ventricles:            Appears normal         Ductal Arch:            Not well visualized  Choroid Plexus:        Appears normal         Diaphragm:              Not well visualized  Cerebellum:            Appears normal         Stomach:                Appears normal, left                                                                        sided  Posterior Fossa:       Appears normal         Abdomen:                Appears normal  Nuchal Fold:           Not applicable (>20    Abdominal Wall:         Appears nml (cord                         wks GA)                                        insert, abd wall)  Face:                  Appears normal         Cord Vessels:           2 vessel cord,                         (  orbits and profile)                                                                        absent left umb art  Lips:                  Not well visualized    Kidneys:                Horseshoe vs left                                                                         pelvic kidney  Palate:                Not well visualized    Bladder:                Appears normal  Thoracic:              Appears normal         Spine:                  Not well visualized  Heart:                 Not well visualized    Upper Extremities:      Visualized  RVOT:                  Appears normal         Lower Extremities:      Appears normal  Other:  Heels visualized. Nasal bone visualized. ---------------------------------------------------------------------- Cervix Uterus Adnexa  Cervix  Not visualized (advanced GA >24wks)  Uterus  No abnormality visualized.  Right Ovary  Within normal limits.  Left Ovary  Within normal limits.  Cul De Sac  No free fluid seen.  Adnexa  No abnormality visualized. ---------------------------------------------------------------------- Impression  On today's ultrasound, amniotic fluid is normal and good fetal  activity is seen. Fetal growth is appropriate for gestational  age. Antenatal testing is reassuring. BPP 8/8.  Following findings are seen:  -Single umbilical artery.  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  Cardiac evaluation was inadequate because of fetal position.  Consultation Note (EPIC)  Ms. Aldrete, G2 P1 at 32w 3d gestation, is here for  ultrasound evaluation because of obstetric cholestasis (bile  acid levels 9.7). She has itching of hands and the body. She  reports she is yet to start her medications prescribed.  Her pregnancy is dated by 29-week ultrasound performed at  your office. Single umbilical artery was seen at your office  scan.  She initiated her prenatal care at your office at 28 weeks'  gestation.  BP today at our office is 145/80 mm Hg.  Obstetric history is significant for a term vaginal delivery in  2016 of a female infant. Her pregnancy was complicated by  gestational diabetes.  I counseled the patient (her husband  was on phone) on the  following:  Abnormal location of  kidney:  Horse-shoe kidneys can be associated with fetal  chromosomal anomalies. Accurate prenatal diagnosis can be  difficult and may be evident only on postnatal evaluation.  Horse-shoe kidneys can be associated with renal tumors  (Wilm's tumor or carcinoid). They are also susceptible to  injuries.  Because of its association with chromosomal anomalies (with  additional finding of single umbilical artery), I discussed  amniocentesis for fetal karyotype. I explained the procedure.  Patient opted not to have amniocentesis.  Differential diagnosis include pelvic kidney, which is usually  hypoplastic.  I recommended fetal echocardiography.  Obstetric cholestasis: I counseled the patient on the  diagnosis and treatment. Stillbirth is a major complication that  can occur, and treatment does not always prevent it. Most  stillbirths occur after [redacted] weeks gestation.   I discussed treatment with ursodeoxycholic acid that reduces  bile acid levels by decreasing the reabsorption of bile acids.  Reduction of bile acids give symptomatic relief. However,  treatment does not prevent stillbirth.  I also informed the patient that antenatal testing  (recommended) does not predict fetal compromise in  pregnancies complicated by cholestasis.  Timing of delivery: No clear consensus exists on the timing of  delivery. To prevent complication of stillbirth, delivery at 15  weeks or even at 36 weeks may be considered if symptoms  worsen and bile acids are increased (>40). ---------------------------------------------------------------------- Recommendations  -We will set up an appointment for fetal echocardiography.  -Weekly BPP from now till delivery.  -Delivery at 37 weeks.  -Postnatal evaluation of newborn's kidneys. ----------------------------------------------------------------------                  Tama High, MD Electronically Signed Final Report   09/15/2019 02:44 pm  ----------------------------------------------------------------------  MAU Course  Procedures  MDM -here from MFM with 6/8 BPP and NRNST -NST in MAU NR -EFM: non-reactive       -baseline: 155       -variability: minimal       -accels: absent       -decels: variable       -TOCO: single ctx -consulted with Dr. Elly Modena who agrees with plan for admission and betamethasone with consideration of repeat BPP in AM -called Dr. Charlesetta Garibaldi at 1150AM, no answer -Dr. Charlesetta Garibaldi returned phone call at [phone], agrees with plan for admission, will have CNM come to bedside to discuss admission -admit to [facility]  No orders of the defined types were placed in this encounter.  No orders of the defined types were placed in this encounter.  Assessment and Plan   1. Non-reactive NST (non-stress test)   2. [redacted] weeks gestation of pregnancy   3. Cholestasis during pregnancy in third trimester   4. Two vessel umbilical cord    -admit to [place] S[facility] G[facility] 09/22/2019, 12:00 PM

## 2019-09-22 NOTE — Progress Notes (Signed)
Patient recvd. Rhogam at 33.[redacted] wks gestation inpatient on [facility].  LOT [id number]  Card given to patient.

## 2019-09-23 ENCOUNTER — Observation Stay (HOSPITAL_BASED_OUTPATIENT_CLINIC_OR_DEPARTMENT_OTHER): Payer: 59

## 2019-09-23 DIAGNOSIS — O289 Unspecified abnormal findings on antenatal screening of mother: Secondary | ICD-10-CM

## 2019-09-23 DIAGNOSIS — Z3A33 33 weeks gestation of pregnancy: Secondary | ICD-10-CM

## 2019-09-23 DIAGNOSIS — K831 Obstruction of bile duct: Secondary | ICD-10-CM

## 2019-09-23 DIAGNOSIS — O26613 Liver and biliary tract disorders in pregnancy, third trimester: Secondary | ICD-10-CM

## 2019-09-23 DIAGNOSIS — O09293 Supervision of pregnancy with other poor reproductive or obstetric history, third trimester: Secondary | ICD-10-CM

## 2019-09-23 DIAGNOSIS — O24419 Gestational diabetes mellitus in pregnancy, unspecified control: Secondary | ICD-10-CM | POA: Diagnosis not present

## 2019-09-23 DIAGNOSIS — D649 Anemia, unspecified: Secondary | ICD-10-CM

## 2019-09-23 DIAGNOSIS — O99013 Anemia complicating pregnancy, third trimester: Secondary | ICD-10-CM

## 2019-09-23 LAB — RH IG WORKUP (INCLUDES ABO/RH)
ABO/RH(D): O NEG
Gestational Age(Wks): 33
Unit division: 0

## 2019-09-23 LAB — GLUCOSE, CAPILLARY: Glucose-Capillary: 75 mg/dL (ref 70–99)

## 2019-09-23 LAB — BILE ACIDS, TOTAL: Bile Acids Total: 4.6 umol/L (ref 0.0–10.0)

## 2019-09-23 MED ORDER — LACTATED RINGERS IV BOLUS
500.0000 mL | Freq: Once | INTRAVENOUS | Status: AC
  Administered 2019-09-23: 500 mL via INTRAVENOUS

## 2019-09-23 NOTE — Discharge Summary (Signed)
Antenatal Discharge Summary    Patient Name: Amanda Ellison DOB: Sep 14, 1990 MRN: [id number]  Date of admission: 09/22/2019  Admitting diagnosis: Equivocal non-stress test [O28.8] Intrauterine pregnancy: [redacted]w[redacted]d     Secondary diagnosis:  Active Problems:   Equivocal non-stress test   Fetal renal anomaly, single gestation   Umbilical cord condition affecting newborn   Gestational diabetes mellitus (GDM)                               History of Present Illness: Ms. Amanda Ellison is a 29 y.o. female, G2P1001, who presents at [redacted]w[redacted]d weeks gestation. The patient has been followed at  [facility]  Her pregnancy has been complicated by:  Patient Active Problem List   Diagnosis Date Noted  . Equivocal non-stress test 09/22/2019  . Fetal renal anomaly, single gestation 09/22/2019  . Umbilical cord condition affecting newborn 09/22/2019  . Gestational diabetes mellitus (GDM) 09/22/2019  . Abnormal uterine bleeding (AUB) 10/24/2016  . Language barrier to communication 04/24/2016  . Metabolic syndrome 04/24/2016  . Anemia 04/24/2016  . 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 Pt was continuously monitored throughout the night and the FHR tracing was reassuring and reactive. Pt is feeling normal fetal movement. Pt states she feels irregular contractions that are not painful. Pt is GDM and blood sugars fasting and 2 hours PP were WNL. Pt received Rhogam during hospital stay. Pt was seen by MFM today for a repeat BPP. BPP was 6/8, 2 off for breathing, with a reactive NST for a total of 8/10. Per MFM, pt is considered stable for discharge and follow-up with them in 10/05/2019. Pt will follow-up at [facility] office early next week.  Physical exam  Vitals:   09/22/19 1916 09/22/19 2327 09/23/19 0322 09/23/19 0752  BP: 136/69 119/70 120/64 124/60  Pulse: 83 77 67 65  Resp:  18 17 18   Temp:  98.7 F (37.1 C)  (!) 97.5 F (36.4 C)   TempSrc:  Oral Oral   SpO2: 100% 100% 100% 100%  Weight:      Height:       Labs: Lab Results  Component Value Date   WBC 5.9 09/22/2019   HGB 11.2 (L) 09/22/2019   HCT 36.8 09/22/2019   MCV 81.8 09/22/2019   PLT 264 09/22/2019   CMP Latest Ref Rng & Units 09/22/2019  Glucose 70 - 99 mg/dL 09/24/2019)  BUN 6 - 20 mg/dL 532(D)  Creatinine <9(M - 1.00 mg/dL 4.26  Sodium 8.34 - 196 mmol/L 135  Potassium 3.5 - 5.1 mmol/L 3.9  Chloride 98 - 111 mmol/L 104  CO2 22 - 32 mmol/L 22  Calcium 8.9 - 10.3 mg/dL 9.0  Total Protein 6.5 - 8.1 g/dL 6.2(L)  Total Bilirubin 0.3 - 1.2 mg/dL 0.6  Alkaline Phos 38 - 126 U/L 137(H)  AST 15 - 41 U/L 29  ALT 0 - 44 U/L 25   Date of discharge: 09/23/2019 Discharge Diagnoses:  Pregnant - undelivered  Activity:           unrestricted Advance as tolerated. Pelvic rest for 6 weeks.  Diet:                routine and Diabetic diet Medications: PNV Condition:  Pt discharge to home in stable condition.   Meds: Allergies as of 09/23/2019   No Known Allergies  Medication List    TAKE these medications   PRENATAL VITAMIN PO Take by mouth.      Discharge Follow Up:  Follow-up [facility]. Schedule an appointment as soon as possible for a visit.   Specialty: Obstetrics and Gynecology Why: Please make an appointment to be seen Monday ro Tuesday in the office.  Contact information: [address] [place] [id number] [phone]         09/23/2019, 10:16 AM  Suzan Nailer, CNM, MSN

## 2019-09-23 NOTE — Plan of Care (Signed)
  Problem: Education: Goal: Knowledge of disease or condition will improve Outcome: Progressing Goal: Knowledge of the prescribed therapeutic regimen will improve Outcome: Progressing   Problem: Clinical Measurements: Goal: Complications related to the disease process, condition or treatment will be avoided or minimized Outcome: Progressing   

## 2019-09-23 NOTE — Discharge Instructions (Signed)
Fetal Movement Counts Patient Name: ________________________________________________ Patient Due Date: ____________________ What is a fetal movement count?  A fetal movement count is the number of times that you feel your baby move during a certain amount of time. This may also be called a fetal kick count. A fetal movement count is recommended for every pregnant woman. You may be asked to start counting fetal movements as early as week 28 of your pregnancy. Pay attention to when your baby is most active. You may notice your baby's sleep and wake cycles. You may also notice things that make your baby move more. You should do a fetal movement count:  When your baby is normally most active.  At the same time each day. A good time to count movements is while you are resting, after having something to eat and drink. How do I count fetal movements? 1. Find a quiet, comfortable area. Sit, or lie down on your side. 2. Write down the date, the start time and stop time, and the number of movements that you felt between those two times. Take this information with you to your health care visits. 3. Write down your start time when you feel the first movement. 4. Count kicks, flutters, swishes, rolls, and jabs. You should feel at least 10 movements. 5. You may stop counting after you have felt 10 movements, or if you have been counting for 2 hours. Write down the stop time. 6. If you do not feel 10 movements in 2 hours, contact your health care provider for further instructions. Your health care provider may want to do additional tests to assess your baby's well-being. Contact a health care provider if:  You feel fewer than 10 movements in 2 hours.  Your baby is not moving like he or she usually does. Date: ____________ Start time: ____________ Stop time: ____________ Movements: ____________ Date: ____________ Start time: ____________ Stop time: ____________ Movements: ____________ Date: ____________  Start time: ____________ Stop time: ____________ Movements: ____________ Date: ____________ Start time: ____________ Stop time: ____________ Movements: ____________ Date: ____________ Start time: ____________ Stop time: ____________ Movements: ____________ Date: ____________ Start time: ____________ Stop time: ____________ Movements: ____________ Date: ____________ Start time: ____________ Stop time: ____________ Movements: ____________ Date: ____________ Start time: ____________ Stop time: ____________ Movements: ____________ Date: ____________ Start time: ____________ Stop time: ____________ Movements: ____________ This information is not intended to replace advice given to you by your health care provider. Make sure you discuss any questions you have with your health care provider. Document Revised: 12/09/2018 Document Reviewed: 12/09/2018 Elsevier Patient Education  2020 Elsevier Inc.  

## 2019-09-26 ENCOUNTER — Encounter: Payer: Self-pay | Admitting: Pediatrics

## 2019-09-29 ENCOUNTER — Ambulatory Visit: Payer: 59 | Attending: Obstetrics and Gynecology

## 2019-09-29 ENCOUNTER — Ambulatory Visit: Payer: 59 | Admitting: *Deleted

## 2019-09-29 ENCOUNTER — Other Ambulatory Visit: Payer: Self-pay

## 2019-09-29 ENCOUNTER — Encounter: Payer: Self-pay | Admitting: *Deleted

## 2019-09-29 VITALS — BP 133/88 | HR 88

## 2019-09-29 DIAGNOSIS — O283 Abnormal ultrasonic finding on antenatal screening of mother: Secondary | ICD-10-CM | POA: Insufficient documentation

## 2019-09-29 DIAGNOSIS — K831 Obstruction of bile duct: Secondary | ICD-10-CM | POA: Diagnosis not present

## 2019-09-29 DIAGNOSIS — O35EXX0 Maternal care for other (suspected) fetal abnormality and damage, fetal genitourinary anomalies, not applicable or unspecified: Secondary | ICD-10-CM

## 2019-09-29 DIAGNOSIS — O358XX0 Maternal care for other (suspected) fetal abnormality and damage, not applicable or unspecified: Secondary | ICD-10-CM | POA: Insufficient documentation

## 2019-09-29 DIAGNOSIS — Z3A34 34 weeks gestation of pregnancy: Secondary | ICD-10-CM | POA: Diagnosis not present

## 2019-09-29 DIAGNOSIS — O26613 Liver and biliary tract disorders in pregnancy, third trimester: Secondary | ICD-10-CM

## 2019-09-30 NOTE — Progress Notes (Unsigned)
   Covid-19 Vaccination Clinic  Name:  Amanda Ellison    MRN: [id number] DOB: [birth year]  05/29/2019  Ms. App was observed post Covid-19 immunization for {COVID Vaccine Observation Times:23551} without incidence. She was provided with Vaccine Information Sheet and instruction to access the V-Safe system.   [name] was instructed to call 911 with any severe reactions post vaccine: Marland Kitchen Difficulty breathing  . Swelling of your face and throat  . A fast heartbeat  . A bad rash all over your body  . Dizziness and weakness      Documented on behalf of:  {KKVACCINE:23624}

## 2019-10-05 ENCOUNTER — Other Ambulatory Visit: Payer: Self-pay

## 2019-10-05 ENCOUNTER — Ambulatory Visit: Payer: 59 | Attending: Obstetrics and Gynecology

## 2019-10-05 ENCOUNTER — Ambulatory Visit: Payer: 59 | Admitting: *Deleted

## 2019-10-05 VITALS — BP 136/84 | HR 84

## 2019-10-05 DIAGNOSIS — K831 Obstruction of bile duct: Secondary | ICD-10-CM

## 2019-10-05 DIAGNOSIS — O35EXX0 Maternal care for other (suspected) fetal abnormality and damage, fetal genitourinary anomalies, not applicable or unspecified: Secondary | ICD-10-CM

## 2019-10-05 DIAGNOSIS — O359XX0 Maternal care for (suspected) fetal abnormality and damage, unspecified, not applicable or unspecified: Secondary | ICD-10-CM | POA: Insufficient documentation

## 2019-10-05 DIAGNOSIS — O358XX0 Maternal care for other (suspected) fetal abnormality and damage, not applicable or unspecified: Secondary | ICD-10-CM | POA: Insufficient documentation

## 2019-10-05 DIAGNOSIS — Z3A35 35 weeks gestation of pregnancy: Secondary | ICD-10-CM

## 2019-10-05 DIAGNOSIS — O26613 Liver and biliary tract disorders in pregnancy, third trimester: Secondary | ICD-10-CM | POA: Diagnosis not present

## 2019-10-10 ENCOUNTER — Inpatient Hospital Stay (HOSPITAL_BASED_OUTPATIENT_CLINIC_OR_DEPARTMENT_OTHER): Payer: PRIVATE HEALTH INSURANCE

## 2019-10-10 ENCOUNTER — Encounter (HOSPITAL_COMMUNITY): Payer: Self-pay | Admitting: Obstetrics & Gynecology

## 2019-10-10 ENCOUNTER — Other Ambulatory Visit: Payer: Self-pay

## 2019-10-10 ENCOUNTER — Inpatient Hospital Stay (HOSPITAL_COMMUNITY)
Admission: AD | Admit: 2019-10-10 | Discharge: 2019-10-10 | Disposition: A | Discharge status: Home / Self Care | Payer: PRIVATE HEALTH INSURANCE | Attending: Obstetrics & Gynecology | Admitting: Obstetrics & Gynecology

## 2019-10-10 DIAGNOSIS — Z8632 Personal history of gestational diabetes: Secondary | ICD-10-CM | POA: Diagnosis not present

## 2019-10-10 DIAGNOSIS — N898 Other specified noninflammatory disorders of vagina: Secondary | ICD-10-CM | POA: Diagnosis present

## 2019-10-10 DIAGNOSIS — Z3A36 36 weeks gestation of pregnancy: Secondary | ICD-10-CM

## 2019-10-10 DIAGNOSIS — K831 Obstruction of bile duct: Secondary | ICD-10-CM | POA: Diagnosis not present

## 2019-10-10 DIAGNOSIS — O4290 Premature rupture of membranes, unspecified as to length of time between rupture and onset of labor, unspecified weeks of gestation: Secondary | ICD-10-CM

## 2019-10-10 DIAGNOSIS — O26613 Liver and biliary tract disorders in pregnancy, third trimester: Secondary | ICD-10-CM | POA: Diagnosis not present

## 2019-10-10 DIAGNOSIS — O288 Other abnormal findings on antenatal screening of mother: Secondary | ICD-10-CM | POA: Diagnosis not present

## 2019-10-10 DIAGNOSIS — Z0371 Encounter for suspected problem with amniotic cavity and membrane ruled out: Secondary | ICD-10-CM | POA: Diagnosis not present

## 2019-10-10 LAB — POCT FERN TEST: POCT Fern Test: NEGATIVE

## 2019-10-10 NOTE — MAU Note (Signed)
.   Amanda Ellison is a 29 y.o. at [redacted]w[redacted]d here in MAU reporting: LOF that started at 0530 this morning. Pt states any leakage at this time. Denies Vaginal bleeding +FM  Onset of complaint: 0530 Pain score: 0 Vitals:   10/10/19 0737  BP: 119/64  Pulse: 89  Resp: 18  Temp: 98.8 F (37.1 C)  SpO2: 100%     FHT:145 Lab orders placed from triage:

## 2019-10-10 NOTE — Discharge Instructions (Signed)
Fetal Movement Counts Patient Name: ________________________________________________ Patient Due Date: ____________________ What is a fetal movement count?  A fetal movement count is the number of times that you feel your baby move during a certain amount of time. This may also be called a fetal kick count. A fetal movement count is recommended for every pregnant woman. You may be asked to start counting fetal movements as early as week 28 of your pregnancy. Pay attention to when your baby is most active. You may notice your baby's sleep and wake cycles. You may also notice things that make your baby move more. You should do a fetal movement count:  When your baby is normally most active.  At the same time each day. A good time to count movements is while you are resting, after having something to eat and drink. How do I count fetal movements? 1. Find a quiet, comfortable area. Sit, or lie down on your side. 2. Write down the date, the start time and stop time, and the number of movements that you felt between those two times. Take this information with you to your health care visits. 3. Write down your start time when you feel the first movement. 4. Count kicks, flutters, swishes, rolls, and jabs. You should feel at least 10 movements. 5. You may stop counting after you have felt 10 movements, or if you have been counting for 2 hours. Write down the stop time. 6. If you do not feel 10 movements in 2 hours, contact your health care provider for further instructions. Your health care provider may want to do additional tests to assess your baby's well-being. Contact a health care provider if:  You feel fewer than 10 movements in 2 hours.  Your baby is not moving like he or she usually does. Date: ____________ Start time: ____________ Stop time: ____________ Movements: ____________ Date: ____________ Start time: ____________ Stop time: ____________ Movements: ____________ Date: ____________  Start time: ____________ Stop time: ____________ Movements: ____________ Date: ____________ Start time: ____________ Stop time: ____________ Movements: ____________ Date: ____________ Start time: ____________ Stop time: ____________ Movements: ____________ Date: ____________ Start time: ____________ Stop time: ____________ Movements: ____________ Date: ____________ Start time: ____________ Stop time: ____________ Movements: ____________ Date: ____________ Start time: ____________ Stop time: ____________ Movements: ____________ Date: ____________ Start time: ____________ Stop time: ____________ Movements: ____________ This information is not intended to replace advice given to you by your health care provider. Make sure you discuss any questions you have with your health care provider. Document Revised: 12/09/2018 Document Reviewed: 12/09/2018 Elsevier Patient Education  2020 Elsevier Inc.  

## 2019-10-10 NOTE — MAU Provider Note (Signed)
Chief Complaint:  Rupture of Membranes   First Provider Initiated Contact with Patient 10/10/19 0805     HPI: Amanda Ellison is a 29 y.o. G2P1001 at [redacted]w[redacted]d who presents to maternity admissions reporting vaginal discharge. Has noticed increase in white discharge and unsure if water was broken. Symptoms started this morning. Denies vaginal symptoms otherwise. Denies contractions or vaginal bleeding. Good fetal movement.  Pregnancy Course: [facility], [facility] list for fetal kidney anomaly. 2 vessel cord. Cholestasis  Past Medical History:  Diagnosis Date  . Cholelithiasis affecting pregnancy, antepartum   . Gestational diabetes   . Hypertension    OB History  Gravida Para Term Preterm AB Living  2 1 1     1   SAB TAB Ectopic Multiple Live Births        0 1    # Outcome Date GA Lbr Len/2nd Weight Sex Delivery Anes PTL Lv  2 Current           1 Term 02/09/15 [redacted]w[redacted]d 02:21 / 00:51 3425 g F Vag-Spont EPI  LIV   History reviewed. No pertinent surgical history. Family History  Problem Relation Age of Onset  . Alcohol abuse Neg Hx   . Arthritis Neg Hx   . Asthma Neg Hx   . Diabetes Neg Hx   . Drug abuse Neg Hx   . Early death Neg Hx   . Birth defects Neg Hx   . Cancer Neg Hx   . COPD Neg Hx   . Depression Neg Hx   . Hearing loss Neg Hx   . Heart disease Neg Hx   . Hyperlipidemia Neg Hx   . Hypertension Neg Hx   . Kidney disease Neg Hx   . Learning disabilities Neg Hx   . Mental illness Neg Hx   . Mental retardation Neg Hx   . Miscarriages / Stillbirths Neg Hx   . Stroke Neg Hx   . Vision loss Neg Hx   . Varicose Veins Neg Hx    Social History   Tobacco Use  . Smoking status: Never Smoker  . Smokeless tobacco: Never Used  Substance Use Topics  . Alcohol use: No  . Drug use: No   No Known Allergies Medications Prior to Admission  Medication Sig Dispense Refill Last Dose  . Prenatal Vit-Fe Fumarate-FA (PRENATAL VITAMIN PO) Take by mouth.   10/10/2019 at Unknown time    I have  reviewed patient's Past Medical Hx, Surgical Hx, Family Hx, Social Hx, medications and allergies.   ROS:  Review of Systems  Constitutional: Negative.   Gastrointestinal: Negative.   Genitourinary: Positive for vaginal discharge.    Physical Exam   Patient Vitals for the past 24 hrs:  BP Temp Pulse Resp SpO2  10/10/19 0737 119/64 98.8 F (37.1 C) 89 18 100 %    Constitutional: Well-developed, well-nourished female in no acute distress.  Cardiovascular: normal rate & rhythm, no murmur Respiratory: normal effort, lung sounds clear throughout GI: Abd soft, non-tender, gravid appropriate for gestational age. Pos BS x 4 MS: Extremities nontender, no edema, normal ROM Neurologic: Alert and oriented x 4.  GU:      Pelvic: NEFG, physiologic discharge, no blood, no pooling     NST:  Baseline: 150 bpm, Variability: Good {> 6 bpm), Accelerations: 10x10 and Decelerations: Absent   Labs: Results for orders placed or performed during the hospital encounter of 10/10/19 (from the past 24 hour(s))  POCT fern test     Status: Normal  Collection Time: 10/10/19  8:19 AM  Result Value Ref Range   POCT Fern Test Negative = intact amniotic membranes     Imaging:  No results found.  MAU Course: Orders Placed This Encounter  Procedures  . [place] MFM FETAL BPP WO NON STRESS  . [place] MFM OB FOLLOW UP  . Contraction - monitoring  . External fetal heart monitoring  . Vaginal exam  . POCT fern test  . Discharge patient   No orders of the defined types were placed in this encounter.   MDM: Pt presents for r/o SROM. On exam, no pooling of fluid & fern negative.  Fetal tracing reassure but not reactive. Sent for BPP which is 8/8 with normal AFI. Per Dr. Judeth Cornfield with MFM, reassured and patient can cancel her upcoming MFM appointment since ultrasound was done today.   Assessment: 1. Encounter for suspected PROM, with rupture of membranes not found   2. Non-reactive NST (non-stress test)   3. [redacted]  weeks gestation of pregnancy     Plan: Discharge home in stable condition.  Cancel MFM appointment Discussed reasons to return to MAU   Follow-up Information    [facility] Follow up.   Specialty: Obstetrics and Gynecology Contact information: [address] [place] [address] [phone]          Allergies as of 10/10/2019   No Known Allergies     Medication List    TAKE these medications   PRENATAL VITAMIN PO Take by mouth.       Judeth Horn, NP 10/10/2019 9:17 AM

## 2019-10-13 ENCOUNTER — Ambulatory Visit: Payer: Medicaid Other

## 2019-10-25 ENCOUNTER — Other Ambulatory Visit: Payer: Self-pay

## 2019-10-25 ENCOUNTER — Inpatient Hospital Stay (HOSPITAL_BASED_OUTPATIENT_CLINIC_OR_DEPARTMENT_OTHER): Payer: PRIVATE HEALTH INSURANCE

## 2019-10-25 ENCOUNTER — Encounter (HOSPITAL_COMMUNITY): Payer: Self-pay | Admitting: Obstetrics & Gynecology

## 2019-10-25 ENCOUNTER — Inpatient Hospital Stay (HOSPITAL_COMMUNITY)
Admission: AD | Admit: 2019-10-25 | Discharge: 2019-10-25 | Disposition: A | Discharge status: Home / Self Care | Payer: PRIVATE HEALTH INSURANCE | Source: Home / Self Care | Attending: Obstetrics & Gynecology | Admitting: Obstetrics & Gynecology

## 2019-10-25 ENCOUNTER — Other Ambulatory Visit: Payer: Self-pay | Admitting: Student

## 2019-10-25 DIAGNOSIS — O99013 Anemia complicating pregnancy, third trimester: Secondary | ICD-10-CM | POA: Insufficient documentation

## 2019-10-25 DIAGNOSIS — O10913 Unspecified pre-existing hypertension complicating pregnancy, third trimester: Secondary | ICD-10-CM | POA: Insufficient documentation

## 2019-10-25 DIAGNOSIS — Z843 Family history of consanguinity: Secondary | ICD-10-CM

## 2019-10-25 DIAGNOSIS — O368330 Maternal care for abnormalities of the fetal heart rate or rhythm, third trimester, not applicable or unspecified: Secondary | ICD-10-CM | POA: Insufficient documentation

## 2019-10-25 DIAGNOSIS — O26613 Liver and biliary tract disorders in pregnancy, third trimester: Secondary | ICD-10-CM

## 2019-10-25 DIAGNOSIS — O2662 Liver and biliary tract disorders in childbirth: Secondary | ICD-10-CM | POA: Diagnosis not present

## 2019-10-25 DIAGNOSIS — O368390 Maternal care for abnormalities of the fetal heart rate or rhythm, unspecified trimester, not applicable or unspecified: Secondary | ICD-10-CM

## 2019-10-25 DIAGNOSIS — Z20822 Contact with and (suspected) exposure to covid-19: Secondary | ICD-10-CM | POA: Insufficient documentation

## 2019-10-25 DIAGNOSIS — Z6791 Unspecified blood type, Rh negative: Secondary | ICD-10-CM

## 2019-10-25 DIAGNOSIS — Z3A38 38 weeks gestation of pregnancy: Secondary | ICD-10-CM | POA: Insufficient documentation

## 2019-10-25 DIAGNOSIS — O09293 Supervision of pregnancy with other poor reproductive or obstetric history, third trimester: Secondary | ICD-10-CM

## 2019-10-25 DIAGNOSIS — D649 Anemia, unspecified: Secondary | ICD-10-CM | POA: Insufficient documentation

## 2019-10-25 DIAGNOSIS — O289 Unspecified abnormal findings on antenatal screening of mother: Secondary | ICD-10-CM

## 2019-10-25 DIAGNOSIS — K831 Obstruction of bile duct: Secondary | ICD-10-CM

## 2019-10-25 DIAGNOSIS — O24419 Gestational diabetes mellitus in pregnancy, unspecified control: Secondary | ICD-10-CM | POA: Insufficient documentation

## 2019-10-25 LAB — CBC WITH DIFFERENTIAL/PLATELET
Abs Immature Granulocytes: 0.05 10*3/uL (ref 0.00–0.07)
Basophils Absolute: 0 10*3/uL (ref 0.0–0.1)
Basophils Relative: 0 %
Eosinophils Absolute: 0 10*3/uL (ref 0.0–0.5)
Eosinophils Relative: 0 %
HCT: 36.7 % (ref 36.0–46.0)
Hemoglobin: 10.9 g/dL — ABNORMAL LOW (ref 12.0–15.0)
Immature Granulocytes: 1 %
Lymphocytes Relative: 21 %
Lymphs Abs: 1.6 10*3/uL (ref 0.7–4.0)
MCH: 23.4 pg — ABNORMAL LOW (ref 26.0–34.0)
MCHC: 29.7 g/dL — ABNORMAL LOW (ref 30.0–36.0)
MCV: 78.8 fL — ABNORMAL LOW (ref 80.0–100.0)
Monocytes Absolute: 0.6 10*3/uL (ref 0.1–1.0)
Monocytes Relative: 9 %
Neutro Abs: 5.2 10*3/uL (ref 1.7–7.7)
Neutrophils Relative %: 69 %
Platelets: 247 10*3/uL (ref 150–400)
RBC: 4.66 MIL/uL (ref 3.87–5.11)
RDW: 15.4 % (ref 11.5–15.5)
WBC: 7.5 10*3/uL (ref 4.0–10.5)
nRBC: 0.3 % — ABNORMAL HIGH (ref 0.0–0.2)

## 2019-10-25 LAB — COMPREHENSIVE METABOLIC PANEL
ALT: 18 U/L (ref 0–44)
AST: 24 U/L (ref 15–41)
Albumin: 2.8 g/dL — ABNORMAL LOW (ref 3.5–5.0)
Alkaline Phosphatase: 160 U/L — ABNORMAL HIGH (ref 38–126)
Anion gap: 9 (ref 5–15)
BUN: <5 mg/dL — ABNORMAL LOW (ref 6–20)
CO2: 18 mmol/L — ABNORMAL LOW (ref 22–32)
Calcium: 8.9 mg/dL (ref 8.9–10.3)
Chloride: 105 mmol/L (ref 98–111)
Creatinine, Ser: 0.5 mg/dL (ref 0.44–1.00)
GFR calc Af Amer: >60 mL/min (ref >60)
GFR calc non Af Amer: >60 mL/min (ref >60)
Glucose, Bld: 93 mg/dL (ref 70–99)
Potassium: 3.8 mmol/L (ref 3.5–5.1)
Sodium: 132 mmol/L — ABNORMAL LOW (ref 135–145)
Total Bilirubin: 0.5 mg/dL (ref 0.3–1.2)
Total Protein: 6.3 g/dL — ABNORMAL LOW (ref 6.5–8.1)

## 2019-10-25 LAB — URINALYSIS, ROUTINE W REFLEX MICROSCOPIC
Bilirubin Urine: NEGATIVE
Glucose, UA: NEGATIVE mg/dL
Hgb urine dipstick: NEGATIVE
Ketones, ur: 5 mg/dL — AB
Nitrite: NEGATIVE
Protein, ur: NEGATIVE mg/dL
Specific Gravity, Urine: 1.009 (ref 1.005–1.030)
pH: 7 (ref 5.0–8.0)

## 2019-10-25 LAB — SARS CORONAVIRUS 2 BY RT PCR (HOSPITAL ORDER, PERFORMED IN ~~LOC~~ HOSPITAL LAB): SARS Coronavirus 2: NEGATIVE

## 2019-10-25 MED ORDER — LACTATED RINGERS IV BOLUS
1000.0000 mL | Freq: Once | INTRAVENOUS | Status: AC
  Administered 2019-10-25: 1000 mL via INTRAVENOUS

## 2019-10-25 NOTE — MAU Note (Signed)
  Amanda Ellison, 28 y.o., G2P1001, [redacted]w[redacted]d presents to MAU for non-reactive NST in office. Primary language is [place]. Here for extended monitoring and BPP. Pregnancy complicated by GDM, single umbilical artery, fetal renal anomaly, cholestasis of pregnancy, Rh negative, late entry to prenatal care at 29 weeks, hx of gest HTN not taking baby ASA as recc, and consanguinity.  Call received from Dr. Noralee Space to discuss timing of delivery R/T dx of cholestasis. MD to speak w/ Dr. Sallye Ober to develop plan of care.   S: Endorses active FM. Denies LOF and vaginal bleeding.  Denies concerns or complaints and does not want to use [place] interpreter.  O: BP 131/82   Pulse 90   Temp 98.6 F (37 C) (Oral)   Resp 18   Ht 5\' 9"  (1.753 m)   Wt 98.3 kg   LMP  (LMP Unknown)   SpO2 99%   BMI 32.02 kg/m     Past medical history:  Past Medical History:  Diagnosis Date  . Cholelithiasis affecting pregnancy, antepartum   . Gestational diabetes   . Hypertension     Past surgical history: History reviewed. No pertinent surgical history. Family History:  Family History  Problem Relation Age of Onset  . Alcohol abuse Neg Hx   . Arthritis Neg Hx   . Asthma Neg Hx   . Diabetes Neg Hx   . Drug abuse Neg Hx   . Early death Neg Hx   . Birth defects Neg Hx   . Cancer Neg Hx   . COPD Neg Hx   . Depression Neg Hx   . Hearing loss Neg Hx   . Heart disease Neg Hx   . Hyperlipidemia Neg Hx   . Hypertension Neg Hx   . Kidney disease Neg Hx   . Learning disabilities Neg Hx   . Mental illness Neg Hx   . Mental retardation Neg Hx   . Miscarriages / Stillbirths Neg Hx   . Stroke Neg Hx   . Vision loss Neg Hx   . Varicose Veins Neg Hx     Social History:  reports that she has never smoked. She has never used smokeless tobacco. She reports that she does not drink alcohol and does not use drugs.  Physical exam: Gen: AAO x3 Abd: soft, nontender, gravid GI/GU:    A/P: Amanda Ellison 28 y.o.  G2P1001 [redacted]w[redacted]d NR NST in office    -extended monitoring and BPP Patient Active Problem List   Diagnosis Date Noted  . Equivocal non-stress test 09/22/2019  . Fetal renal anomaly, single gestation 09/22/2019  . Umbilical cord condition affecting newborn 09/22/2019  . Gestational diabetes mellitus (GDM) 09/22/2019  . Abnormal uterine bleeding (AUB) 10/24/2016  . Language barrier to communication 04/24/2016  . Metabolic syndrome 04/24/2016  . Anemia 04/24/2016  . 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 09/02/2014 MSN, CNM 10/25/2019 5:30 PM

## 2019-10-25 NOTE — H&P (Signed)
OB ADMISSION/ HISTORY & PHYSICAL:  Admission Date: 10/25/2019  1:46 PM  Admit Diagnosis: IOL for cholestasis  Parthenia Dilley is a 29 y.o. female G2P1001 [redacted]w[redacted]d presenting for IOL. Endorses active FM, denies LOF and vaginal bleeding.   History of current pregnancy: G2P1001   Patient entered care with [facility] at 28+3 wks.   EDC of 11/07/19 was established by 3rd trimester US Anatomy scan:  29+3 wks w/ anterior placenta.    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    Prenatal Labs: ABO, Rh: --/--/O NEG (06/22 1503) Antibody: POS (06/22 1503) last Rhogam 10/10/19 Rubella:   immune RPR:   NR HBsAg:   NR HIV:   NR GTT: failed 3 hr GBS:   negative GC/CHL: neg/neg Genetics: low-risk female Vaccines: UTD tdap, declined influenza    OB History  Gravida Para Term Preterm AB Living  2 1 1     1   SAB TAB Ectopic Multiple Live Births        0 1    # Outcome Date GA Lbr Len/2nd Weight Sex Delivery Anes PTL Lv  2 Current           1 Term 02/09/15 [redacted]w[redacted]d 02:21 / 00:51 3425 g F Vag-Spont EPI  LIV    Medical / Surgical History: Past medical history:  Past Medical History:  Diagnosis Date   Cholelithiasis affecting pregnancy, antepartum    Gestational diabetes    Hypertension     Past surgical history: History reviewed. No pertinent  surgical history. Family History:  Family History  Problem Relation Age of Onset   Alcohol abuse Neg Hx    Arthritis Neg Hx    Asthma Neg Hx    Diabetes Neg Hx    Drug abuse Neg Hx    Early death Neg Hx    Birth defects Neg Hx    Cancer Neg Hx    COPD Neg Hx    Depression Neg Hx    Hearing loss Neg Hx    Heart disease Neg Hx    Hyperlipidemia Neg Hx    Hypertension Neg Hx    Kidney disease Neg Hx    Learning disabilities Neg Hx    Mental illness Neg Hx    Mental retardation Neg Hx    Miscarriages / Stillbirths Neg Hx    Stroke Neg Hx    Vision loss Neg Hx    Varicose Veins Neg Hx     Social History:  reports that she has never smoked. She has never used smokeless tobacco. She reports that she does not drink alcohol and does not use drugs.  Allergies: Patient has no known allergies.   Current Medications at time of admission:  Prior to Admission medications   Medication Sig Start Date End Date Taking? Authorizing Provider  Prenatal Vit-Fe Fumarate-FA (PRENATAL VITAMIN PO) Take by mouth.    [provider]    Review of  Systems: Constitutional: Negative   HENT: Negative   Eyes: Negative   Respiratory: Negative   Cardiovascular: Negative   Gastrointestinal: Negative  Genitourinary: neg for bloody show, neg for LOF   Musculoskeletal: Negative   Skin: Negative   Neurological: Negative   Endo/Heme/Allergies: Negative   Psychiatric/Behavioral: Negative    Physical Exam: VS: Blood pressure 130/85, pulse 73, temperature 99.1 F (37.3 C), temperature source Oral, resp. rate 17, height 5\' 9"  (1.753 m), weight 98.3 kg, SpO2 99 %, currently breastfeeding. AAO x3, no signs of distress Cardiovascular: RRR Respiratory: Lung fields clear to ausculation GU/GI: Abdomen gravid, non-tender, non-distended, active FM, vertex Extremities: no edema, negative for pain, tenderness, and cords  Cervical exam:Dilation: 3 Effacement (%): 80 Station: -1 Exam by:: Burman Foster, CNM FHR: baseline rate  / variability moderate / accelerations present / absent decelerations TOCO: 2 min   Prenatal Transfer Tool  Maternal Diabetes: Yes:  Diabetes Type:  Insulin/Medication controlled Genetic Screening: Normal Maternal Ultrasounds/Referrals: Fetal Kidney Anomalies and Other:2 vessel cord Fetal Ultrasounds or other Referrals:  Referred to Materal Fetal Medicine  Maternal Substance Abuse:  No Significant Maternal Medications:  None Significant Maternal Lab Results: Group B Strep negative    Assessment: 29 y.o. 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

## 2019-10-25 NOTE — H&P (Deleted)
  The note originally documented on this encounter has been moved the the encounter in which it belongs.  

## 2019-10-25 NOTE — MAU Provider Note (Signed)
       Antenatal Discharge Summary  Patient Name: Amanda Ellison DOB: [birth year] MRN: [id number]  Date of admission: 10/25/2019 Intrauterine pregnancy: [redacted]w[redacted]d   Admitting diagnosis: 32wks monitoring Secondary diagnosis: Patient Active Problem List   Diagnosis Date Noted  . Equivocal non-stress test 09/22/2019  . Fetal renal anomaly, single gestation 09/22/2019  . Umbilical cord condition affecting newborn 09/22/2019  . Gestational diabetes mellitus (GDM) 09/22/2019  . Abnormal uterine bleeding (AUB) 10/24/2016  . Language barrier to communication 04/24/2016  . Metabolic syndrome 04/24/2016  . Anemia 04/24/2016  . 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   Labs: Lab Results  Component Value Date   WBC 7.5 10/25/2019   HGB 10.9 (L) 10/25/2019   HCT 36.7 10/25/2019   MCV 78.8 (L) 10/25/2019   PLT 247 10/25/2019   CMP Latest Ref Rng & Units 10/25/2019  Glucose 70 - 99 mg/dL 93  BUN 6 - 20 mg/dL <2(I)  Creatinine 2.03 - 1.00 mg/dL 5.59  Sodium 741 - 638 mmol/L 132(L)  Potassium 3.5 - 5.1 mmol/L 3.8  Chloride 98 - 111 mmol/L 105  CO2 22 - 32 mmol/L 18(L)  Calcium 8.9 - 10.3 mg/dL 8.9  Total Protein 6.5 - 8.1 g/dL 6.3(L)  Total Bilirubin 0.3 - 1.2 mg/dL 0.5  Alkaline Phos 38 - 126 U/L 160(H)  AST 15 - 41 U/L 24  ALT 0 - 44 U/L 18    Physical Exam @ time of discharge:  Vitals:   10/25/19 1408 10/25/19 1440 10/25/19 2010  BP: 138/90 131/82 130/85  Pulse: 100 90 73  Resp: 18  17  Temp: 98.6 F (37 C)  99.1 F (37.3 C)  TempSrc: Oral  Oral  SpO2: 100% 99%   Weight: 98.3 kg    Height: 5\' 9"  (1.753 m)     general: alert, cooperative and no distress  NST-reactive,  irregular contractions Dilation: 3 Effacement (%): 80 Station: -1 Exam by:: [email or web address], CNM  lextremities: DVT Evaluation: No evidence of DVT seen on physical exam. No cords or calf tenderness. No significant calf/ankle edema.  Discharge instructions:  "Baby and Me Booklet" and [facility] Booklet Discharge Medications:  Allergies as of 10/25/2019   No Known Allergies     Medication List    TAKE these medications   PRENATAL VITAMIN PO Take by mouth.      Diet: routine diet Follow 10/27/2019 to [facility] at midnight for IOL  Signed: [facility] MSN, CNM 10/25/2019, 11:03 PM

## 2019-10-26 ENCOUNTER — Encounter (HOSPITAL_COMMUNITY): Payer: Self-pay | Admitting: Obstetrics & Gynecology

## 2019-10-26 ENCOUNTER — Inpatient Hospital Stay (HOSPITAL_COMMUNITY): Payer: PRIVATE HEALTH INSURANCE | Admitting: Anesthesiology

## 2019-10-26 ENCOUNTER — Inpatient Hospital Stay (HOSPITAL_COMMUNITY): Payer: PRIVATE HEALTH INSURANCE

## 2019-10-26 ENCOUNTER — Inpatient Hospital Stay (HOSPITAL_COMMUNITY)
Admission: AD | Admit: 2019-10-26 | Discharge: 2019-10-28 | DRG: 805 | Disposition: A | Discharge status: Home / Self Care | Payer: PRIVATE HEALTH INSURANCE | Attending: Obstetrics & Gynecology | Admitting: Obstetrics & Gynecology

## 2019-10-26 DIAGNOSIS — Z20822 Contact with and (suspected) exposure to covid-19: Secondary | ICD-10-CM | POA: Diagnosis present

## 2019-10-26 DIAGNOSIS — K831 Obstruction of bile duct: Secondary | ICD-10-CM | POA: Diagnosis present

## 2019-10-26 DIAGNOSIS — Z3490 Encounter for supervision of normal pregnancy, unspecified, unspecified trimester: Secondary | ICD-10-CM | POA: Diagnosis not present

## 2019-10-26 DIAGNOSIS — O26893 Other specified pregnancy related conditions, third trimester: Secondary | ICD-10-CM | POA: Diagnosis present

## 2019-10-26 DIAGNOSIS — Z3A38 38 weeks gestation of pregnancy: Secondary | ICD-10-CM

## 2019-10-26 DIAGNOSIS — O2662 Liver and biliary tract disorders in childbirth: Principal | ICD-10-CM | POA: Diagnosis present

## 2019-10-26 DIAGNOSIS — O26899 Other specified pregnancy related conditions, unspecified trimester: Secondary | ICD-10-CM

## 2019-10-26 DIAGNOSIS — Z6791 Unspecified blood type, Rh negative: Secondary | ICD-10-CM | POA: Diagnosis not present

## 2019-10-26 DIAGNOSIS — O9902 Anemia complicating childbirth: Secondary | ICD-10-CM | POA: Diagnosis present

## 2019-10-26 DIAGNOSIS — Z8719 Personal history of other diseases of the digestive system: Secondary | ICD-10-CM | POA: Diagnosis present

## 2019-10-26 DIAGNOSIS — O24429 Gestational diabetes mellitus in childbirth, unspecified control: Secondary | ICD-10-CM | POA: Diagnosis present

## 2019-10-26 DIAGNOSIS — O358XX0 Maternal care for other (suspected) fetal abnormality and damage, not applicable or unspecified: Secondary | ICD-10-CM | POA: Diagnosis present

## 2019-10-26 DIAGNOSIS — D62 Acute posthemorrhagic anemia: Secondary | ICD-10-CM | POA: Diagnosis not present

## 2019-10-26 DIAGNOSIS — O9081 Anemia of the puerperium: Secondary | ICD-10-CM | POA: Diagnosis not present

## 2019-10-26 DIAGNOSIS — O24419 Gestational diabetes mellitus in pregnancy, unspecified control: Secondary | ICD-10-CM | POA: Diagnosis present

## 2019-10-26 DIAGNOSIS — Z8632 Personal history of gestational diabetes: Secondary | ICD-10-CM | POA: Diagnosis present

## 2019-10-26 DIAGNOSIS — O35EXX0 Maternal care for other (suspected) fetal abnormality and damage, fetal genitourinary anomalies, not applicable or unspecified: Secondary | ICD-10-CM

## 2019-10-26 LAB — CBC
HCT: 36.1 % (ref 36.0–46.0)
Hemoglobin: 10.7 g/dL — ABNORMAL LOW (ref 12.0–15.0)
MCH: 23.5 pg — ABNORMAL LOW (ref 26.0–34.0)
MCHC: 29.6 g/dL — ABNORMAL LOW (ref 30.0–36.0)
MCV: 79.3 fL — ABNORMAL LOW (ref 80.0–100.0)
Platelets: 255 10*3/uL (ref 150–400)
RBC: 4.55 MIL/uL (ref 3.87–5.11)
RDW: 15.6 % — ABNORMAL HIGH (ref 11.5–15.5)
WBC: 7.1 10*3/uL (ref 4.0–10.5)
nRBC: 0.3 % — ABNORMAL HIGH (ref 0.0–0.2)

## 2019-10-26 LAB — GLUCOSE, CAPILLARY
Glucose-Capillary: 76 mg/dL (ref 70–99)
Glucose-Capillary: 97 mg/dL (ref 70–99)
Glucose-Capillary: 101 mg/dL — ABNORMAL HIGH (ref 70–99)
Glucose-Capillary: 107 mg/dL — ABNORMAL HIGH (ref 70–99)
Glucose-Capillary: 85 mg/dL (ref 70–99)

## 2019-10-26 LAB — TYPE AND SCREEN
ABO/RH(D): O NEG
Antibody Screen: POSITIVE

## 2019-10-26 LAB — RPR: RPR Ser Ql: NONREACTIVE

## 2019-10-26 MED ORDER — OXYTOCIN-SODIUM CHLORIDE 30-0.9 UT/500ML-% IV SOLN: 2.5000 [IU]/h | INTRAVENOUS | Status: DC

## 2019-10-26 MED ORDER — LIDOCAINE HCL (PF) 1 % IJ SOLN: 30.0000 mL | INTRAMUSCULAR | Status: DC | PRN

## 2019-10-26 MED ORDER — PHENYLEPHRINE 40 MCG/ML (10ML) SYRINGE FOR IV PUSH (FOR BLOOD PRESSURE SUPPORT): 80.0000 ug | PREFILLED_SYRINGE | INTRAVENOUS | Status: DC | PRN

## 2019-10-26 MED ORDER — ONDANSETRON HCL 4 MG/2ML IJ SOLN: 4.0000 mg | INTRAMUSCULAR | Status: DC | PRN

## 2019-10-26 MED ORDER — LACTATED RINGERS IV SOLN: 500.0000 mL | INTRAVENOUS | Status: DC | PRN

## 2019-10-26 MED ORDER — COCONUT OIL OIL: 1.0000 "application " | TOPICAL_OIL | Status: DC | PRN

## 2019-10-26 MED ORDER — SENNOSIDES-DOCUSATE SODIUM 8.6-50 MG PO TABS
2.0000 | ORAL_TABLET | ORAL | Status: DC
  Administered 2019-10-26 – 2019-10-27 (×2): 2 via ORAL
  Filled 2019-10-26 (×2): qty 2

## 2019-10-26 MED ORDER — WITCH HAZEL-GLYCERIN EX PADS: 1.0000 "application " | MEDICATED_PAD | CUTANEOUS | Status: DC | PRN

## 2019-10-26 MED ORDER — EPHEDRINE 5 MG/ML INJ: 10.0000 mg | INTRAVENOUS | Status: DC | PRN

## 2019-10-26 MED ORDER — LACTATED RINGERS IV SOLN
500.0000 mL | Freq: Once | INTRAVENOUS | Status: AC
  Administered 2019-10-26: 500 mL via INTRAVENOUS

## 2019-10-26 MED ORDER — DIPHENHYDRAMINE HCL 25 MG PO CAPS: 25.0000 mg | ORAL_CAPSULE | Freq: Four times a day (QID) | ORAL | Status: DC | PRN

## 2019-10-26 MED ORDER — DIBUCAINE (PERIANAL) 1 % EX OINT: 1.0000 "application " | TOPICAL_OINTMENT | CUTANEOUS | Status: DC | PRN

## 2019-10-26 MED ORDER — SOD CITRATE-CITRIC ACID 500-334 MG/5ML PO SOLN: 30.0000 mL | ORAL | Status: DC | PRN

## 2019-10-26 MED ORDER — FENTANYL-BUPIVACAINE-NACL 0.5-0.125-0.9 MG/250ML-% EP SOLN
12.0000 mL/h | EPIDURAL | Status: DC | PRN
  Filled 2019-10-26: qty 250

## 2019-10-26 MED ORDER — DIPHENHYDRAMINE HCL 50 MG/ML IJ SOLN: 12.5000 mg | INTRAMUSCULAR | Status: DC | PRN

## 2019-10-26 MED ORDER — ONDANSETRON HCL 4 MG PO TABS: 4.0000 mg | ORAL_TABLET | ORAL | Status: DC | PRN

## 2019-10-26 MED ORDER — LIDOCAINE HCL (PF) 1 % IJ SOLN
INTRAMUSCULAR | Status: DC | PRN
  Administered 2019-10-26: 6 mL via EPIDURAL

## 2019-10-26 MED ORDER — OXYCODONE-ACETAMINOPHEN 5-325 MG PO TABS: 1.0000 | ORAL_TABLET | ORAL | Status: DC | PRN

## 2019-10-26 MED ORDER — ACETAMINOPHEN 325 MG PO TABS: 650.0000 mg | ORAL_TABLET | ORAL | Status: DC | PRN

## 2019-10-26 MED ORDER — IBUPROFEN 600 MG PO TABS
600.0000 mg | ORAL_TABLET | Freq: Four times a day (QID) | ORAL | Status: DC
  Administered 2019-10-26 – 2019-10-28 (×10): 600 mg via ORAL
  Filled 2019-10-26 (×10): qty 1

## 2019-10-26 MED ORDER — MISOPROSTOL 200 MCG PO TABS
ORAL_TABLET | ORAL | Status: AC
  Administered 2019-10-26: 800 ug via RECTAL
  Filled 2019-10-26: qty 4

## 2019-10-26 MED ORDER — TERBUTALINE SULFATE 1 MG/ML IJ SOLN: 0.2500 mg | Freq: Once | INTRAMUSCULAR | Status: DC | PRN

## 2019-10-26 MED ORDER — OXYCODONE-ACETAMINOPHEN 5-325 MG PO TABS: 2.0000 | ORAL_TABLET | ORAL | Status: DC | PRN

## 2019-10-26 MED ORDER — SIMETHICONE 80 MG PO CHEW: 80.0000 mg | CHEWABLE_TABLET | ORAL | Status: DC | PRN

## 2019-10-26 MED ORDER — FERROUS SULFATE 325 (65 FE) MG PO TABS
325.0000 mg | ORAL_TABLET | Freq: Two times a day (BID) | ORAL | Status: DC
  Administered 2019-10-26 – 2019-10-28 (×5): 325 mg via ORAL
  Filled 2019-10-26 (×5): qty 1

## 2019-10-26 MED ORDER — ZOLPIDEM TARTRATE 5 MG PO TABS: 5.0000 mg | ORAL_TABLET | Freq: Every evening | ORAL | Status: DC | PRN

## 2019-10-26 MED ORDER — OXYTOCIN BOLUS FROM INFUSION
333.0000 mL | Freq: Once | INTRAVENOUS | Status: AC
  Administered 2019-10-26: 333 mL via INTRAVENOUS

## 2019-10-26 MED ORDER — FLEET ENEMA 7-19 GM/118ML RE ENEM: 1.0000 | ENEMA | RECTAL | Status: DC | PRN

## 2019-10-26 MED ORDER — MISOPROSTOL 200 MCG PO TABS: 800.0000 ug | ORAL_TABLET | Freq: Once | ORAL | Status: AC

## 2019-10-26 MED ORDER — TETANUS-DIPHTH-ACELL PERTUSSIS 5-2.5-18.5 LF-MCG/0.5 IM SUSP: 0.5000 mL | Freq: Once | INTRAMUSCULAR | Status: DC

## 2019-10-26 MED ORDER — OXYTOCIN-SODIUM CHLORIDE 30-0.9 UT/500ML-% IV SOLN
1.0000 m[IU]/min | INTRAVENOUS | Status: DC
  Administered 2019-10-26: 2 m[IU]/min via INTRAVENOUS
  Filled 2019-10-26: qty 500

## 2019-10-26 MED ORDER — ONDANSETRON HCL 4 MG/2ML IJ SOLN: 4.0000 mg | Freq: Four times a day (QID) | INTRAMUSCULAR | Status: DC | PRN

## 2019-10-26 MED ORDER — LACTATED RINGERS IV SOLN: INTRAVENOUS | Status: DC

## 2019-10-26 MED ORDER — FENTANYL-BUPIVACAINE-NACL 0.5-0.125-0.9 MG/250ML-% EP SOLN: 12.0000 mL/h | EPIDURAL | Status: DC | PRN

## 2019-10-26 MED ORDER — SODIUM CHLORIDE (PF) 0.9 % IJ SOLN
INTRAMUSCULAR | Status: DC | PRN
  Administered 2019-10-26: 12 mL/h via EPIDURAL

## 2019-10-26 MED ORDER — BENZOCAINE-MENTHOL 20-0.5 % EX AERO
1.0000 "application " | INHALATION_SPRAY | CUTANEOUS | Status: DC | PRN
  Administered 2019-10-26: 1 via TOPICAL
  Filled 2019-10-26: qty 56

## 2019-10-26 MED ORDER — PRENATAL MULTIVITAMIN CH
1.0000 | ORAL_TABLET | Freq: Every day | ORAL | Status: DC
  Administered 2019-10-26 – 2019-10-28 (×3): 1 via ORAL
  Filled 2019-10-26 (×3): qty 1

## 2019-10-26 NOTE — Progress Notes (Signed)
Subjective:    Comfortable w/ epidural  Objective:    VS: BP (!) 143/81   Pulse 81   Temp 99.1 F (37.3 C) (Oral)   Ht 5\' 9"  (1.753 m)   Wt 98.4 kg   LMP  (LMP Unknown)   SpO2 99%   BMI 32.05 kg/m  FHR : baseline 145 / variability moderate / accelerations absent / absent decelerations Toco: contractions every 2-3 minutes  Membranes: AROM @ 0300, clear, scant Dilation: 6 Effacement (%): 80, 90 Cervical Position: Middle Station: 0 Presentation: Vertex Exam by:: [email or web address], CNM  Pitocin 6 mU/min  Assessment/Plan:   29 y.o. G2P1001 [redacted]w[redacted]d  Labor: Progressing normally on Pitocin w/ AROM Preeclampsia:  no signs or symptoms of toxicity Fetal Wellbeing:  Category I Pain Control:  Epidural I/D:  GBS neg Anticipated MOD:  NSVD  [redacted]w[redacted]d MSN, CNM 10/26/2019 3:05 AM

## 2019-10-26 NOTE — Anesthesia Preprocedure Evaluation (Signed)
Anesthesia Evaluation  Patient identified by MRN, date of birth, ID band Patient awake    Reviewed: Allergy & Precautions, H&P , NPO status , Patient's Chart, lab work & pertinent test results, reviewed documented beta blocker date and time   Airway Mallampati: III  TM Distance: >3 FB Neck ROM: full    Dental no notable dental hx. (+) Teeth Intact   Pulmonary neg pulmonary ROS,    Pulmonary exam normal breath sounds clear to auscultation       Cardiovascular hypertension, Pt. on medications negative cardio ROS Normal cardiovascular exam Rhythm:regular Rate:Normal     Neuro/Psych negative neurological ROS  negative psych ROS   GI/Hepatic negative GI ROS, Neg liver ROS,   Endo/Other  negative endocrine ROSdiabetes, Gestational  Renal/GU negative Renal ROS  negative genitourinary   Musculoskeletal   Abdominal   Peds  Hematology  (+) Blood dyscrasia, anemia ,   Anesthesia Other Findings   Reproductive/Obstetrics (+) Pregnancy                             Anesthesia Physical Anesthesia Plan  ASA: III  Anesthesia Plan: Epidural   Post-op Pain Management:    Induction:   PONV Risk Score and Plan:   Airway Management Planned:   Additional Equipment:   Intra-op Plan:   Post-operative Plan:   Informed Consent: I have reviewed the patients History and Physical, chart, labs and discussed the procedure including the risks, benefits and alternatives for the proposed anesthesia with the patient or authorized representative who has indicated his/her understanding and acceptance.     Dental Advisory Given  Plan Discussed with:   Anesthesia Plan Comments: (Labs checked- platelets confirmed with RN in room. Fetal heart tracing, per RN, reported to be stable enough for sitting procedure. Discussed epidural, and patient consents to the procedure:  included risk of possible  headache,backache, failed block, allergic reaction, and nerve injury. This patient was asked if she had any questions or concerns before the procedure started.)        Anesthesia Quick Evaluation

## 2019-10-26 NOTE — Anesthesia Procedure Notes (Signed)
Epidural Patient location during procedure: OB Start time: 10/26/2019 2:25 AM End time: 10/26/2019 2:30 AM  Staffing Anesthesiologist: Bethena Midget, MD  Preanesthetic Checklist Completed: patient identified, IV checked, site marked, risks and benefits discussed, surgical consent, monitors and equipment checked, pre-op evaluation and timeout performed  Epidural Patient position: sitting Prep: DuraPrep and site prepped and draped Patient monitoring: continuous pulse ox and blood pressure Approach: midline Location: L3-L4 Injection technique: LOR air  Needle:  Needle type: Tuohy  Needle gauge: 17 G Needle length: 9 cm and 9 Needle insertion depth: 8 cm Catheter type: closed end flexible Catheter size: 19 Gauge Catheter at skin depth: 13 cm Test dose: negative  Assessment Events: blood not aspirated, injection not painful, no injection resistance, no paresthesia and negative IV test

## 2019-10-26 NOTE — Anesthesia Postprocedure Evaluation (Signed)
Anesthesia Post Note  Patient: Amanda Ellison  Procedure(s) Performed: AN AD HOC LABOR EPIDURAL     Patient location during evaluation: Mother Baby Anesthesia Type: Epidural Level of consciousness: awake and alert Pain management: pain level controlled Vital Signs Assessment: post-procedure vital signs reviewed and stable Respiratory status: spontaneous breathing, nonlabored ventilation and respiratory function stable Cardiovascular status: stable Postop Assessment: no headache, no backache and epidural receding Anesthetic complications: no   No complications documented.  Last Vitals:  Vitals:   10/26/19 0641 10/26/19 0754  BP: 127/77 126/74  Pulse: 69 70  Resp: 16 20  Temp: 37 C 36.9 C  SpO2: 100% 100%    Last Pain:  Vitals:   10/26/19 0755  TempSrc:   PainSc: 0-No pain   Pain Goal:                   [facility] Records

## 2019-10-26 NOTE — Lactation Note (Signed)
This note was copied from a baby's chart. Lactation Consultation Note  Patient Name: Amanda Ellison Today's Date: 10/26/2019 Reason for consult: Initial assessment;NICU baby;Early term 69-38.6wks Baby is 7 hours old and in the NICU with respiratory distress.  Baby is currently receiving oxygen per nasal cannula.  Baby also has a OG tube in place.  She is awake and showing early feeding cues.  Mom shown hand expression but no colostrum seen.  Mom has erect nipples and compressible breasts.  Baby latched a few times briefly and then became sleepy.  No swallows noted.  RN plans to tube feed baby.  Mom will need to initiate pumping every 3 hours with symphony pump.    Maternal Data Has patient been taught Hand Expression?: Yes Does the patient have breastfeeding experience prior to this delivery?: Yes  Feeding Feeding Type: Breast Fed  LATCH Score Latch: Repeated attempts needed to sustain latch, nipple held in mouth throughout feeding, stimulation needed to elicit sucking reflex.  Audible Swallowing: None  Type of Nipple: Everted at rest and after stimulation  Comfort (Breast/Nipple): Soft / non-tender  Hold (Positioning): Assistance needed to correctly position infant at breast and maintain latch.  LATCH Score: 6  Interventions    Lactation Tools Discussed/Used     Consult Status Consult Status: Follow-up Date: 10/27/19 Follow-up type: In-patient    Huston Foley 10/26/2019, 11:36 AM

## 2019-10-27 LAB — CBC
HCT: 33 % — ABNORMAL LOW (ref 36.0–46.0)
Hemoglobin: 9.9 g/dL — ABNORMAL LOW (ref 12.0–15.0)
MCH: 23.5 pg — ABNORMAL LOW (ref 26.0–34.0)
MCHC: 30 g/dL (ref 30.0–36.0)
MCV: 78.2 fL — ABNORMAL LOW (ref 80.0–100.0)
Platelets: 259 10*3/uL (ref 150–400)
RBC: 4.22 MIL/uL (ref 3.87–5.11)
RDW: 15.5 % (ref 11.5–15.5)
WBC: 9.3 10*3/uL (ref 4.0–10.5)
nRBC: 0.2 % (ref 0.0–0.2)

## 2019-10-27 LAB — GLUCOSE, CAPILLARY
Glucose-Capillary: 72 mg/dL (ref 70–99)
Glucose-Capillary: 91 mg/dL (ref 70–99)

## 2019-10-27 MED ORDER — RHO D IMMUNE GLOBULIN 1500 UNIT/2ML IJ SOSY
300.0000 ug | PREFILLED_SYRINGE | Freq: Once | INTRAMUSCULAR | Status: AC
  Administered 2019-10-27: 300 ug via INTRAVENOUS
  Filled 2019-10-27: qty 2

## 2019-10-27 NOTE — Progress Notes (Signed)
PPD # 1 S/P NSVD  Live born female  Birth Weight: 6 lb 13.4 oz (3100 g) APGAR: 8, 9  Newborn Delivery   Birth date/time: 10/26/2019 04:23:00 Delivery type: Vaginal, Spontaneous     Baby name: Undecided Delivering provider: Rhea Pink B  Episiotomy:None   Lacerations:1st degree   Feeding: breast  Pain control at delivery: Epidural   S:  Reports feeling well, no complaints, visiting baby in NICU             Tolerating po/ No nausea or vomiting             Bleeding is moderate             Pain controlled with acetaminophen and ibuprofen (OTC)             Up ad lib / ambulatory / voiding without difficulties   O:  A & O x 3, in no apparent distress              VS:  Vitals:   10/26/19 1250 10/26/19 1833 10/26/19 2139 10/27/19 0458  BP: 118/61 123/84 120/72 126/83  Pulse: 65 69 67 88  Resp: 17 20 18 16   Temp: 98.4 F (36.9 C) 97.7 F (36.5 C) 98.6 F (37 C) 98.5 F (36.9 C)  TempSrc: Oral Oral Oral Oral  SpO2: 99% 100% 100% 100%  Weight:      Height:        LABS:  Recent Labs    10/25/19 1518 10/26/19 0454  WBC 7.5 7.1  HGB 10.9* 10.7*  HCT 36.7 36.1  PLT 247 255    Blood type: --/--/O NEG Performed at [facility], [address], [place], [place]  [phone])  Rubella:     I&O: I/O last 3 completed shifts: In: -  Out: 453 [Urine:300; Blood:153]          No intake/output data recorded.   Gen: AAO x 3, NAD  Abdomen: soft, non-tender, non-distended             Fundus: firm, non-tender, U-1  Perineum: healing, no edema or hematoma  Lochia: moderate  Extremities: no edema, no calf pain or tenderness   A/P: PPD # 1 28 y.o., (52/77   Principal Problem:   Postpartum care following vaginal delivery NSVD 6/23 Active Problems:   Encounter for induction of labor   Cholestasis during pregnancy in third trimester   SVD (6/23)   First degree perineal laceration during delivery   Doing well - stable status  Routine post partum orders              Baby admitted to NICU, anticipated discharge early next week  Anticipate discharge tomorrow   06-24-1997, MSN, CNM 10/27/2019, 11:01 AM

## 2019-10-27 NOTE — Lactation Note (Signed)
This note was copied from a baby's chart. Lactation Consultation Note  Patient Name: Amanda Ellison AVWUJ'W Date: 10/27/2019 Reason for consult: Follow-up assessment;Mother's request;NICU baby  1420 - 1455 - I followed up with [name] and assisted with latching baby in cross cradle hold on the right breast. Baby latches and becomes fussy, with indications that [name] would like more volume at the breast. I fed a 5 French feeding tube device with 10 mls of formula to baby while on breast. She calmed down and breast fed for about 10 minutes and removed 7 mls with little prompting.  I encouraged [name] to continue putting baby to breast on demand and supplementing after as needed. We discussed providing positive opportunities to breast feed.  Encouraged [name] to lean back and bring baby to the breast and do less "jiggling" of baby at the breast.   I also encouraged her to continue to pump every 2-3 hours by day and every 3-4 hours at night.  Maternal Data Does the patient have breastfeeding experience prior to this delivery?: Yes  Feeding Feeding Type: Breast Milk with Formula added  LATCH Score Latch: Grasps breast easily, tongue down, lips flanged, rhythmical sucking.  Audible Swallowing: A few with stimulation  Type of Nipple: Everted at rest and after stimulation  Comfort (Breast/Nipple): Soft / non-tender  Hold (Positioning): Assistance needed to correctly position infant at breast and maintain latch.  LATCH Score: 8  Interventions Interventions: Breast feeding basics reviewed;Assisted with latch;Hand express;Breast compression;Adjust position (5 french feeding tube w formula)  Lactation Tools Discussed/Used Tools: 87F feeding tube / Syringe Pump Review: Setup, frequency, and cleaning   Consult Status Consult Status: Follow-up Date: 10/27/19 Follow-up type: In-patient    Amanda Ellison 10/27/2019, 3:36 PM

## 2019-10-28 DIAGNOSIS — O9902 Anemia complicating childbirth: Secondary | ICD-10-CM | POA: Diagnosis present

## 2019-10-28 LAB — RH IG WORKUP (INCLUDES ABO/RH)
ABO/RH(D): O NEG
Fetal Screen: NEGATIVE
Gestational Age(Wks): 38.2
Unit division: 0

## 2019-10-28 LAB — SURGICAL PATHOLOGY

## 2019-10-28 MED ORDER — COCONUT OIL OIL: 1.0000 "application " | TOPICAL_OIL | 0 refills | Status: DC | PRN

## 2019-10-28 MED ORDER — MAGNESIUM OXIDE 400 (241.3 MG) MG PO TABS: 400.0000 mg | ORAL_TABLET | Freq: Every day | ORAL | 1 refills | Status: DC

## 2019-10-28 MED ORDER — ACETAMINOPHEN 500 MG PO TABS
1000.0000 mg | ORAL_TABLET | Freq: Four times a day (QID) | ORAL | 2 refills | Status: DC | PRN
Start: 2019-10-28 — End: 2019-12-29

## 2019-10-28 MED ORDER — POLYSACCHARIDE IRON COMPLEX 150 MG PO CAPS: 150.0000 mg | ORAL_CAPSULE | Freq: Every day | ORAL | Status: DC

## 2019-10-28 MED ORDER — IBUPROFEN 600 MG PO TABS: 600.0000 mg | ORAL_TABLET | Freq: Four times a day (QID) | ORAL | 0 refills | Status: DC

## 2019-10-28 MED ORDER — MAGNESIUM OXIDE 400 (241.3 MG) MG PO TABS
400.0000 mg | ORAL_TABLET | Freq: Every day | ORAL | Status: DC
  Administered 2019-10-28: 400 mg via ORAL

## 2019-10-28 MED ORDER — BENZOCAINE-MENTHOL 20-0.5 % EX AERO: 1.0000 "application " | INHALATION_SPRAY | CUTANEOUS | 1 refills | Status: DC | PRN

## 2019-10-28 NOTE — Lactation Note (Addendum)
This note was copied from a baby's chart. Lactation Consultation Note  Patient Name: Amanda Ellison RUEAV'W Date: 10/28/2019 Reason for consult: Follow-up assessment;NICU baby;Early term 37-38.6wks;Other (Comment) (P 2 - mom for D/C today and plans to stay with baby in NICU until D/C. [place] interpreter - [place] - [phone])  Mom speaks limited English. LC used the interpreter.  Per mom did not pump at all last night and the #24 F has been to snug.  LC recommended increasing to the #27 F and to allow her breast to hang more natural due to the size of her breast and the tissue would mod better in the flange.  Sore nipple and engorgement prevention and tx reviewed.  LC reviewed supply and demand / importance of being consistent with pumping around the clock 8-10 times a day until the baby is more consistent latching.  Storage of breast milk reviewed.  Per mom doesn't have a DEBP at home and is not active with WIC .  Per mom dad speaks English and will cal WIC today to get her signed up and LC mentioned she would send a [facility] referral for a DEBP.  LC provided the phone numbers for [facility].  Mom handed her phone to [facility] to take with her husband to confirm he would be calling to sign mom.   Report given to Amanda Ellison MBURN     Maternal Data    Feeding Feeding Type: Formula Nipple Type: Dr. Levert Feinstein Preemie  LATCH Score                   Interventions Interventions: Breast feeding basics reviewed;DEBP  Lactation Tools Discussed/Used Tools: Pump;Flanges Flange Size: 24;27 (mom mentioned the #24 is snug and LC mentiond to increase to the #71F) Breast pump type: Double-Electric Breast Pump WIC Program: No (LC recommended and gave mom the phone numbers for dad to call and LC today is sending a [facility] referral for a DEBP) Pump Review: Setup, frequency, and cleaning;Milk Storage Initiated by:: Reviewed - MAI - 6/25   Consult Status Consult Status: Follow-up Date:  10/29/19 (baby in NICU) Follow-up type: In-patient    Amanda Ellison Amanda Ellison 10/28/2019, 12:22 PM

## 2019-10-28 NOTE — Clinical Social Work Maternal (Signed)
CLINICAL SOCIAL WORK MATERNAL/CHILD NOTE  Patient Details  Name: Amanda Ellison MRN: [id number] Date of Birth: 05/30/1990  Date:  01/26/2020  Clinical Social Worker Initiating Note:  Laurey Arrow Date/Time: Initiated:  10/27/19/1500     Child's Name:  Amanda Ellison   Biological Parents:  Mother, Father   Need for Interpreter:  [place]   Reason for Referral:  Parental Support of Children with Anomalies/Syndromes   Address:  [address] [place] [address]    Phone number:  [phone] (home)     Additional phone number: FOB's number is [phone]  Household Members/Support Persons (HM/SP):   Household Member/Support Person 1, Household Member/Support Person 2   HM/SP Name Relationship DOB or Age  HM/SP -1 Akma Tamimou daughter 02/09/2015  HM/SP -2 Killou Taminmou husband 10/13/1966  HM/SP -3        HM/SP -4        HM/SP -5        HM/SP -6        HM/SP -7        HM/SP -8          Natural Supports (not living in the home):  Extended Family, Immediate Family, Radiographer, therapeutic Supports: None   Employment: Unemployed   Type of Work:     Education:  9 to 11 years   Homebound arranged: No  Financial Resources:  [employer]   Other Resources:   (CSW provided MOB with information to apply for [employer] and Physicist, medical)   Cultural/Religious Considerations Which May Impact Care:  None reported  Strengths:  Ability to meet basic needs , Lexicographer chosen, Home prepared for child    Psychotropic Medications:         Pediatrician:    Solicitor area  Pediatrician List:   [facility] ([address])  [place]      Pediatrician Fax Number:    Risk Factors/Current Problems:  None   Cognitive State:  Insightful , Goal Oriented , Linear Thinking    Mood/Affect:  Interested , Comfortable , Happy , Bright , Relaxed     CSW Assessment: CSW met with MOB in room 414 to complete an assessment for late [facility] and NICU admission. CSW utilized [place] [phone]) interpreting services to assist with language barrier When CSW arrived MOB was resting on the couch and shared "I plan to visit with my baby in the NICU shortly." CSW offered to return at a later time and MOB declined.  CSW inquired about MOB's thoughts and feelings regarding NICU admission.  MOB shared, "I'm ok, she is doing good and I know she is going to be ok." Per MOB, MOB feels well informed by NICU staff and communicated that staff has always been available to speak with her. CSW reviewed NICU visitation and MOB denied having an questions, concerns, or barriers to future visits.   CSW asked about MOB's PNC and why care was initiated after 28 weeks.  MOB stated, "I have an irregular period and I didn't know I was pregnant.  One day I sitting down in my home and I seen my stomach moving;  I called and made an appointment and found out I was pregnant." Per MOB, MOB has been consistent with [facility] care since. CSW explained hospital's Late PNC policy and MOB was understanding. MOB denied the use of  all illicit substances and expressed that she was not concerned about infant's screens. CSW made MOB aware that infant's UDS was negative and CSW will continue to monitor infant's CDS.  CSW informed MOB that if infant's CDS is positive without an explanation, CSW will make a report to [facility] CPS. MOB communicated that MOB is prepared for infant and has all essential items. MOB did not have any questions or concerns at this time, and CSW thanked MOB for allowing CSW to meet with MOB.  CSW will continue to offer resources and supports to family while infant remains in NICU.    CSW Plan/Description:  Psychosocial Support and Ongoing Assessment of Needs, Sudden Infant Death Syndrome (SIDS) Education, Perinatal Mood and Anxiety Disorder (PMADs) Education, Other  Patient/Family Education, Other Information/Referral to [employer], MSW, [employer] Clinical Social Work [phone]  Dimple Nanas, LCSW 10/28/2019, 9:59 AM

## 2019-10-28 NOTE — Discharge Summary (Signed)
Postpartum Discharge Summary  Date of Service updated 10/28/2019      Patient Name: Amanda Ellison DOB: 05-Dec-1990 MRN: [id number]  Date of admission: 10/26/2019 Delivery date:10/26/2019  Delivering provider: Burman Foster B  Date of discharge: 10/28/2019  Admitting diagnosis: Cholestasis during pregnancy in third trimester [O26.613, K83.1] Intrauterine pregnancy: [redacted]w[redacted]d    Secondary diagnosis:  Principal Problem:   Postpartum care following vaginal delivery NSVD 6/23 Active Problems:   Rh negative, antepartum   Fetal renal anomaly, single gestation   Gestational diabetes mellitus (GDM)   Encounter for induction of labor   Cholestasis 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 yo [id number] admitted to the hospital 10/26/2019 for induction of labor.  Indication for induction: Cholestasis of pregnancy.  Patient had an uncomplicated labor course as follows: Membrane Rupture Time/Date: 3:00 AM ,10/26/2019   Delivery Method:Vaginal, Spontaneous  Episiotomy: None  Lacerations:  1st degree  Details of delivery can be found in separate delivery note.  Patient had a routine postpartum course. Patient is discharged home 10/28/19.  Newborn Data: Birth date:10/26/2019  Birth time:4:23 AM  Gender:Female  Living status:Living  Apgars:8 ,9  Weight:3100 g  Admit to NICU for fetal anomalies / renal  Magnesium Sulfate received: No BMZ received: No Rhophylac:Yes MMR:No T-DaP:Given prenatally Flu: No Transfusion:No  Physical exam  Vitals:   10/26/19 2139 10/27/19 0458 10/27/19 2132  10/28/19 0544  BP: 120/72 126/83 121/77   Pulse: 67 88 75 77  Resp: '18 16 16 18  ' Temp: 98.6 F (37 C) 98.5 F (36.9 C) 98.2 F (36.8 C) 97.9 F (36.6 C)  TempSrc: Oral Oral Oral Axillary  SpO2: 100% 100% 100%   Weight:      Height:       General: alert, cooperative and no distress Lochia: appropriate Uterine Fundus: firm Incision: N/A DVT Evaluation: No cords or calf tenderness. No significant calf/ankle edema. Labs: Lab Results  Component Value Date   WBC 9.3 10/27/2019   HGB 9.9 (L) 10/27/2019   HCT 33.0 (L) 10/27/2019   MCV 78.2 (L) 10/27/2019   PLT 259 10/27/2019   CMP Latest Ref Rng & Units 10/25/2019  Glucose 70 - 99 mg/dL 93  BUN 6 - 20 mg/dL <5(L)  Creatinine 0.44 - 1.00 mg/dL 0.50  Sodium 135 - 145 mmol/L 132(L)  Potassium 3.5 - 5.1 mmol/L 3.8  Chloride 98 - 111 mmol/L 105  CO2 22 - 32 mmol/L 18(L)  Calcium 8.9 - 10.3 mg/dL 8.9  Total Protein 6.5 - 8.1 g/dL 6.3(L)  Total Bilirubin 0.3 - 1.2 mg/dL 0.5  Alkaline Phos 38 - 126 U/L 160(H)  AST 15 - 41 U/L 24  ALT 0 - 44 U/L 18   Edinburgh Score: No flowsheet data found.    After visit meds:  Allergies as of 10/28/2019   No Known Allergies     Medication List  TAKE these medications   acetaminophen 500 MG tablet Commonly known as: TYLENOL Take 2 tablets (1,000 mg total) by mouth every 6 (six) hours as needed.   benzocaine-Menthol 20-0.5 % Aero Commonly known as: DERMOPLAST Apply 1 application topically as needed for irritation (perineal discomfort).   coconut oil Oil Apply 1 application topically as needed.   ibuprofen 600 MG tablet Commonly known as: ADVIL Take 1 tablet (600 mg total) by mouth every 6 (six) hours.   iron polysaccharides 150 MG capsule Commonly known as: Ferrex 150 Take 1 capsule (150 mg total) by mouth daily.   magnesium oxide 400 (241.3 Mg) MG tablet Commonly known as: MAG-OX Take 1 tablet (400 mg total) by mouth daily. Start taking on: October 29, 2019   PRENATAL  VITAMIN PO Take 1 tablet by mouth daily.            Discharge Care Instructions  (From admission, onward)         Start     Ordered   10/28/19 0000  Discharge wound care:       Comments: Sitz baths 2 times /day with warm water x 1 week. May add herbals: 1 ounce dried comfrey leaf* 1 ounce calendula flowers 1 ounce lavender flowers  Supplies can be found online at [employer] sources at [employer], [employer]  1/2 ounce dried uva ursi leaves 1/2 ounce witch hazel blossoms (if you can find them) 1/2 ounce dried sage leaf 1/2 cup sea salt Directions: Bring 2 quarts of water to a boil. Turn off heat, and place 1 ounce (approximately 1 large handful) of the above mixed herbs (not the salt) into the pot. Steep, covered, for 30 minutes.  Strain the liquid well with a fine mesh strainer, and discard the herb material. Add 2 quarts of liquid to the tub, along with the 1/2 cup of salt. This medicinal liquid can also be made into compresses and peri-rinses.   10/28/19 1130           Discharge home in stable condition Infant Feeding: pumping Infant Disposition:NICU Discharge instruction: per After Visit Summary and Postpartum booklet. Activity: Advance as tolerated. Pelvic rest for 6 weeks.  Diet: routine diet Anticipated Birth Control: Unsure Postpartum Appointment:2 weeks Additional Postpartum F/U: Postpartum Depression checkup  Follow up Visit:  [facility]. Go in 2 week(s).   Specialty: Obstetrics and Gynecology Contact information: [address] [place] [address] [phone]                  10/28/2019 Juliene Pina, CNM

## 2019-10-28 NOTE — Progress Notes (Signed)
Called to NICU to speak with patient. She is at bedside with her baby. She states she is fine and feeling well. I let her know I need to assess her soon and meet her. She plans to return to unit soon.

## 2019-12-29 ENCOUNTER — Other Ambulatory Visit: Payer: Self-pay

## 2019-12-29 ENCOUNTER — Ambulatory Visit: Payer: 59 | Attending: Internal Medicine | Admitting: Internal Medicine

## 2019-12-29 ENCOUNTER — Encounter: Payer: Self-pay | Admitting: Internal Medicine

## 2019-12-29 VITALS — BP 140/94 | HR 67 | Temp 98.6°F | Resp 16 | Wt 216.0 lb

## 2019-12-29 DIAGNOSIS — M546 Pain in thoracic spine: Secondary | ICD-10-CM | POA: Diagnosis not present

## 2019-12-29 DIAGNOSIS — L299 Pruritus, unspecified: Secondary | ICD-10-CM | POA: Diagnosis not present

## 2019-12-29 DIAGNOSIS — M545 Low back pain, unspecified: Secondary | ICD-10-CM

## 2019-12-29 DIAGNOSIS — Z2821 Immunization not carried out because of patient refusal: Secondary | ICD-10-CM | POA: Insufficient documentation

## 2019-12-29 DIAGNOSIS — R030 Elevated blood-pressure reading, without diagnosis of hypertension: Secondary | ICD-10-CM

## 2019-12-29 DIAGNOSIS — R945 Abnormal results of liver function studies: Secondary | ICD-10-CM

## 2019-12-29 DIAGNOSIS — R7989 Other specified abnormal findings of blood chemistry: Secondary | ICD-10-CM

## 2019-12-29 MED ORDER — DICLOFENAC SODIUM 1 % EX GEL: 2.0000 g | Freq: Two times a day (BID) | CUTANEOUS | 1 refills | Status: DC | PRN

## 2019-12-29 MED ORDER — ACETAMINOPHEN 500 MG PO TABS: 500.0000 mg | ORAL_TABLET | Freq: Three times a day (TID) | ORAL | 2 refills | Status: AC | PRN

## 2019-12-29 MED FILL — DICLOFENAC SODIUM 1% GEL: 1 | 25 days supply | Qty: 100 | Fill #0

## 2019-12-29 NOTE — Patient Instructions (Signed)
Wear a well fitting bra during the day.   Avoid heavy lifting and excessive pushing or pulling heavy objects.    DASH Eating Plan DASH stands for "Dietary Approaches to Stop Hypertension." The DASH eating plan is a healthy eating plan that has been shown to reduce high blood pressure (hypertension). It may also reduce your risk for type 2 diabetes, heart disease, and stroke. The DASH eating plan may also help with weight loss. What are tips for following this plan?  General guidelines  Avoid eating more than 2,300 mg (milligrams) of salt (sodium) a day. If you have hypertension, you may need to reduce your sodium intake to 1,500 mg a day.  Limit alcohol intake to no more than 1 drink a day for nonpregnant women and 2 drinks a day for men. One drink equals 12 oz of beer, 5 oz of wine, or 1 oz of hard liquor.  Work with your health care provider to maintain a healthy body weight or to lose weight. Ask what an ideal weight is for you.  Get at least 30 minutes of exercise that causes your heart to beat faster (aerobic exercise) most days of the week. Activities may include walking, swimming, or biking.  Work with your health care provider or diet and nutrition specialist (dietitian) to adjust your eating plan to your individual calorie needs. Reading food labels   Check food labels for the amount of sodium per serving. Choose foods with less than 5 percent of the Daily Value of sodium. Generally, foods with less than 300 mg of sodium per serving fit into this eating plan.  To find whole grains, look for the word "whole" as the first word in the ingredient list. Shopping  Buy products labeled as "low-sodium" or "no salt added."  Buy fresh foods. Avoid canned foods and premade or frozen meals. Cooking  Avoid adding salt when cooking. Use salt-free seasonings or herbs instead of table salt or sea salt. Check with your health care provider or pharmacist before using salt substitutes.  Do  not fry foods. Cook foods using healthy methods such as baking, boiling, grilling, and broiling instead.  Cook with heart-healthy oils, such as olive, canola, soybean, or sunflower oil. Meal planning  Eat a balanced diet that includes: ? 5 or more servings of fruits and vegetables each day. At each meal, try to fill half of your plate with fruits and vegetables. ? Up to 6-8 servings of whole grains each day. ? Less than 6 oz of lean meat, poultry, or fish each day. A 3-oz serving of meat is about the same size as a deck of cards. One egg equals 1 oz. ? 2 servings of low-fat dairy each day. ? A serving of nuts, seeds, or beans 5 times each week. ? Heart-healthy fats. Healthy fats called Omega-3 fatty acids are found in foods such as flaxseeds and coldwater fish, like sardines, salmon, and mackerel.  Limit how much you eat of the following: ? Canned or prepackaged foods. ? Food that is high in trans fat, such as fried foods. ? Food that is high in saturated fat, such as fatty meat. ? Sweets, desserts, sugary drinks, and other foods with added sugar. ? Full-fat dairy products.  Do not salt foods before eating.  Try to eat at least 2 vegetarian meals each week.  Eat more home-cooked food and less restaurant, buffet, and fast food.  When eating at a restaurant, ask that your food be prepared with less salt  or no salt, if possible. What foods are recommended? The items listed may not be a complete list. Talk with your dietitian about what dietary choices are best for you. Grains Whole-grain or whole-wheat bread. Whole-grain or whole-wheat pasta. Brown rice. [place]. Bulgur. Whole-grain and low-sodium cereals. Pita bread. Low-fat, low-sodium crackers. Whole-wheat flour tortillas. Vegetables Fresh or frozen vegetables (raw, steamed, roasted, or grilled). Low-sodium or reduced-sodium tomato and vegetable juice. Low-sodium or reduced-sodium tomato sauce and tomato paste. Low-sodium or  reduced-sodium canned vegetables. Fruits All fresh, dried, or frozen fruit. Canned fruit in natural juice (without added sugar). Meat and other protein foods Skinless chicken or [place]. Ground chicken or [place]. Pork with fat trimmed off. Fish and seafood. Egg whites. Dried beans, peas, or lentils. Unsalted nuts, nut butters, and seeds. Unsalted canned beans. Lean cuts of beef with fat trimmed off. Low-sodium, lean deli meat. Dairy Low-fat (1%) or fat-free (skim) milk. Fat-free, low-fat, or reduced-fat cheeses. Nonfat, low-sodium ricotta or cottage cheese. Low-fat or nonfat yogurt. Low-fat, low-sodium cheese. Fats and oils Soft margarine without trans fats. Vegetable oil. Low-fat, reduced-fat, or light mayonnaise and salad dressings (reduced-sodium). Canola, safflower, olive, soybean, and sunflower oils. Avocado. Seasoning and other foods Herbs. Spices. Seasoning mixes without salt. Unsalted popcorn and pretzels. Fat-free sweets. What foods are not recommended? The items listed may not be a complete list. Talk with your dietitian about what dietary choices are best for you. Grains Baked goods made with fat, such as croissants, muffins, or some breads. Dry pasta or rice meal packs. Vegetables Creamed or fried vegetables. Vegetables in a cheese sauce. Regular canned vegetables (not low-sodium or reduced-sodium). Regular canned tomato sauce and paste (not low-sodium or reduced-sodium). Regular tomato and vegetable juice (not low-sodium or reduced-sodium). Angie Fava. Olives. Fruits Canned fruit in a light or heavy syrup. Fried fruit. Fruit in cream or butter sauce. Meat and other protein foods Fatty cuts of meat. Ribs. Fried meat. Berniece Salines. Sausage. Bologna and other processed lunch meats. Salami. Fatback. Hotdogs. Bratwurst. Salted nuts and seeds. Canned beans with added salt. Canned or smoked fish. Whole eggs or egg yolks. Chicken or [place] with skin. Dairy Whole or 2% milk, cream, and half-and-half.  Whole or full-fat cream cheese. Whole-fat or sweetened yogurt. Full-fat cheese. Nondairy creamers. Whipped toppings. Processed cheese and cheese spreads. Fats and oils Butter. Stick margarine. Lard. Shortening. Ghee. Bacon fat. Tropical oils, such as coconut, palm kernel, or palm oil. Seasoning and other foods Salted popcorn and pretzels. Onion salt, garlic salt, seasoned salt, table salt, and sea salt. Worcestershire sauce. Tartar sauce. Barbecue sauce. Teriyaki sauce. Soy sauce, including reduced-sodium. Steak sauce. Canned and packaged gravies. Fish sauce. Oyster sauce. Cocktail sauce. Horseradish that you find on the shelf. Ketchup. Mustard. Meat flavorings and tenderizers. Bouillon cubes. Hot sauce and Tabasco sauce. Premade or packaged marinades. Premade or packaged taco seasonings. Relishes. Regular salad dressings. Where to find more information:  National Heart, Lung, and [place]: https://wilson-eaton.com/  American Heart Association: www.heart.org Summary  The DASH eating plan is a healthy eating plan that has been shown to reduce high blood pressure (hypertension). It may also reduce your risk for type 2 diabetes, heart disease, and stroke.  With the DASH eating plan, you should limit salt (sodium) intake to 2,300 mg a day. If you have hypertension, you may need to reduce your sodium intake to 1,500 mg a day.  When on the DASH eating plan, aim to eat more fresh fruits and vegetables, whole grains, lean proteins, low-fat dairy,  and heart-healthy fats.  Work with your health care provider or diet and nutrition specialist (dietitian) to adjust your eating plan to your individual calorie needs. This information is not intended to replace advice given to you by your health care provider. Make sure you discuss any questions you have with your health care provider. Document Revised: 04/03/2017 Document Reviewed: 04/14/2016 Elsevier Patient Education  2020 [employer].

## 2019-12-29 NOTE — Progress Notes (Signed)
Patient ID: Chera Ellison, female    DOB: April 16, 1991  MRN: [id number]  CC: Back Pain   Subjective: Amanda Ellison is a 29 y.o. female who presents for UC visit.  Last seen 05/2018 Her concerns today include:  Patient with history of irregular menses.  Obesity, gestational diabetes, anemia  Pt c/o pain b/w shoulder blades and in lower back x 2 wks. No initiiating factors Pain in midline.  No radiation Goes and comes during the day.  Last about 5 minutes at a time Better with sitting and walking. Worse with laying down and when holding her 2 mth old infant to nurse.  She does not wear bra during the day because it makes her breasts feel uncomfortable. No weakness in the extremities.  No fever or dysuria.  No taking anything for pain.  C/o itching all over when she showers x 2 mths. Lasts for 5-10 minutes.  Started in late pregnancy.  She had cholestasis of pregnancy. No yellowing of eyes or abdominal pain.  BP noted to be elevated today.  She reports no problems with blood pressure during her pregnancy or in the past.  She denies any headaches or dizziness.  HM:  Declines flu shot and COVID vaccine  Patient Active Problem List   Diagnosis Date Noted  . Maternal anemia, with delivery 10/28/2019  . Postpartum care following vaginal delivery NSVD 6/23 10/27/2019  . Encounter for induction of labor 10/26/2019  . Cholestasis during pregnancy in third trimester 10/26/2019  . SVD (6/23) 10/26/2019  . First degree perineal laceration during delivery 10/26/2019  . Fetal renal anomaly, single gestation 09/22/2019  . Gestational diabetes mellitus (GDM) 09/22/2019  . Anemia 04/24/2016  . Rh negative, antepartum 10/22/2014  . 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   No current facility-administered medications on file prior to visit.    No Known Allergies  Social History   Socioeconomic History  . Marital status: Married      Spouse name: [name]    . Number of children: 1  . Years of education: Not on file  . Highest education level: Not on file  Occupational History  . Occupation: Housekeeping  Tobacco Use  . Smoking status: Never Smoker  . Smokeless tobacco: Never Used  Vaping Use  . Vaping Use: Never used  Substance and Sexual Activity  . Alcohol use: No  . Drug use: No  . Sexual activity: Yes    Birth control/protection: Pill  Other Topics Concern  . Not on file  Social History Narrative  . Not on file   Social Determinants of Health   Financial Resource Strain:   . Difficulty of Paying Living Expenses: Not on file  Food Insecurity:   . Worried About Programme researcher, broadcasting/film/video in the Last Year: Not on file  . Ran Out of Food in the Last Year: Not on file  Transportation Needs:   . Lack of Transportation (Medical): Not on file  . Lack of Transportation (Non-Medical): Not on file  Physical Activity:   . Days of Exercise per Week: Not on file  . Minutes of Exercise per Session: Not on file  Stress:   . Feeling of Stress : Not on file  Social Connections:   . Frequency of Communication with Friends and Family: Not on file  . Frequency of Social Gatherings with Friends and Family: Not on file  . Attends Religious Services: Not on file  .  Active Member of Clubs or Organizations: Not on file  . Attends Banker Meetings: Not on file  . Marital Status: Not on file  Intimate Partner Violence:   . Fear of Current or Ex-Partner: Not on file  . Emotionally Abused: Not on file  . Physically Abused: Not on file  . Sexually Abused: Not on file    Family History  Problem Relation Age of Onset  . Alcohol abuse Neg Hx   . Arthritis Neg Hx   . Asthma Neg Hx   . Diabetes Neg Hx   . Drug abuse Neg Hx   . Early death Neg Hx   . Birth defects Neg Hx   . Cancer Neg Hx   . COPD Neg Hx   . Depression Neg Hx   . Hearing loss Neg Hx   . Heart disease Neg Hx   . Hyperlipidemia  Neg Hx   . Hypertension Neg Hx   . Kidney disease Neg Hx   . Learning disabilities Neg Hx   . Mental illness Neg Hx   . Mental retardation Neg Hx   . Miscarriages / Stillbirths Neg Hx   . Stroke Neg Hx   . Vision loss Neg Hx   . Varicose Veins Neg Hx     No past surgical history on file.  ROS: Review of Systems Negative except as stated above  PHYSICAL EXAM: BP (!) 140/94   Pulse 67   Temp 98.6 F (37 C)   Resp 16   Wt 216 lb (98 kg)   LMP  (LMP Unknown)   SpO2 98%   BMI 31.90 kg/m   Wt Readings from Last 3 Encounters:  12/29/19 216 lb (98 kg)  10/26/19 217 lb (98.4 kg)  10/25/19 216 lb 12.8 oz (98.3 kg)    Physical Exam  General appearance - alert, well appearing, African female and in no distress Mental status - normal mood, behavior, speech, dress, motor activity, and thought processes Chest - clear to auscultation, no wheezes, rales or rhonchi, symmetric air entry Heart - normal rate, regular rhythm, normal S1, S2, no murmurs, rubs, clicks or gallops Breasts: Pendulous Musculoskeletal -mild tenderness on palpation of the upper thoracic spine between the lower edge of the shoulder blades.  No tenderness on palpation of the paraspinal muscles.  Mild tenderness on palpation of the upper lumbar spine.  No tenderness on palpation of the surrounding paraspinal muscles.  Power in both upper and lower extremities 5/5 bilaterally. Skin: No rash seen.  No jaundice. CMP Latest Ref Rng & Units 10/25/2019 09/22/2019 03/31/2018  Glucose 70 - 99 mg/dL 93 161(W) 960(A)  BUN 6 - 20 mg/dL <5(W) <0(J) 13  Creatinine 0.44 - 1.00 mg/dL 8.11 9.14 7.82  Sodium 135 - 145 mmol/L 132(L) 135 136  Potassium 3.5 - 5.1 mmol/L 3.8 3.9 4.4  Chloride 98 - 111 mmol/L 105 104 104  CO2 22 - 32 mmol/L 18(L) 22 27  Calcium 8.9 - 10.3 mg/dL 8.9 9.0 9.6  Total Protein 6.5 - 8.1 g/dL 6.3(L) 6.2(L) 7.2  Total Bilirubin 0.3 - 1.2 mg/dL 0.5 0.6 0.4  Alkaline Phos 38 - 126 U/L 160(H) 137(H) 58  AST 15 -  41 U/L 24 29 23   ALT 0 - 44 U/L 18 25 22    Lipid Panel  No results found for: CHOL, TRIG, HDL, CHOLHDL, VLDL, LDLCALC, LDLDIRECT  CBC    Component Value Date/Time   WBC 9.3 10/27/2019 1632   RBC 4.22 10/27/2019  1632   HGB 9.9 (L) 10/27/2019 1632   HGB 12.5 10/24/2016 1130   HCT 33.0 (L) 10/27/2019 1632   HCT 40.1 10/24/2016 1130   PLT 259 10/27/2019 1632   PLT 271 10/24/2016 1130   MCV 78.2 (L) 10/27/2019 1632   MCV 85 10/24/2016 1130   MCH 23.5 (L) 10/27/2019 1632   MCHC 30.0 10/27/2019 1632   RDW 15.5 10/27/2019 1632   RDW 14.5 10/24/2016 1130   LYMPHSABS 1.6 10/25/2019 1518   MONOABS 0.6 10/25/2019 1518   EOSABS 0.0 10/25/2019 1518   BASOSABS 0.0 10/25/2019 1518    ASSESSMENT AND PLAN: 1. Acute midline thoracic back pain Pain seems to be musculoskeletal in nature and most likely strain.  I note that her breasts are very pendulous.  I recommend that she try to wear good supportive bra during the day. I have prescribed some diclofenac gel to use as needed in the upper thoracic and lumbar regions of her back.  Tylenol as needed.  According to up-to-date, both are safe in breast-feeding women. - acetaminophen (TYLENOL) 500 MG tablet; Take 1 tablet (500 mg total) by mouth every 8 (eight) hours as needed.  Dispense: 60 tablet; Refill: 2 - diclofenac Sodium (VOLTAREN) 1 % GEL; Apply 2 g topically 2 (two) times daily as needed.  Dispense: 100 g; Refill: 1  2. Acute midline low back pain without sciatica See #1 above - acetaminophen (TYLENOL) 500 MG tablet; Take 1 tablet (500 mg total) by mouth every 8 (eight) hours as needed.  Dispense: 60 tablet; Refill: 2 - diclofenac Sodium (VOLTAREN) 1 % GEL; Apply 2 g topically 2 (two) times daily as needed.  Dispense: 100 g; Refill: 1  3. Itching Questionable etiology.  We will do some baseline blood test today including liver function tests. - CBC - Comprehensive metabolic panel  4. Elevated blood-pressure reading without diagnosis of  hypertension DASH diet discussed and encouraged - CBC - Comprehensive metabolic panel  5. Influenza vaccination declined This was offered and patient declined  6. COVID-19 virus vaccination declined Patient declined.     Patient was given the opportunity to ask questions.  Patient verbalized understanding of the plan and was able to repeat key elements of the plan.   Orders Placed This Encounter  Procedures  . CBC  . Comprehensive metabolic panel     Requested Prescriptions   Signed Prescriptions Disp Refills  . acetaminophen (TYLENOL) 500 MG tablet 60 tablet 2    Sig: Take 1 tablet (500 mg total) by mouth every 8 (eight) hours as needed.  . diclofenac Sodium (VOLTAREN) 1 % GEL 100 g 1    Sig: Apply 2 g topically 2 (two) times daily as needed.    Return in about 3 weeks (around 01/19/2020) for BP recheck.  Jonah Blue, MD, FACP

## 2019-12-30 LAB — COMPREHENSIVE METABOLIC PANEL
ALT: 69 IU/L — ABNORMAL HIGH (ref 0–32)
AST: 45 IU/L — ABNORMAL HIGH (ref 0–40)
Albumin/Globulin Ratio: 1.7 (ref 1.2–2.2)
Albumin: 4.5 g/dL (ref 3.9–5.0)
Alkaline Phosphatase: 107 IU/L (ref 48–121)
BUN/Creatinine Ratio: 16 (ref 9–23)
BUN: 10 mg/dL (ref 6–20)
Bilirubin Total: 0.3 mg/dL (ref 0.0–1.2)
CO2: 22 mmol/L (ref 20–29)
Calcium: 9.7 mg/dL (ref 8.7–10.2)
Chloride: 103 mmol/L (ref 96–106)
Creatinine, Ser: 0.64 mg/dL (ref 0.57–1.00)
GFR calc Af Amer: 138 mL/min/{1.73_m2} (ref >59)
GFR calc non Af Amer: 120 mL/min/{1.73_m2} (ref >59)
Globulin, Total: 2.6 g/dL (ref 1.5–4.5)
Glucose: 86 mg/dL (ref 65–99)
Potassium: 4.5 mmol/L (ref 3.5–5.2)
Sodium: 140 mmol/L (ref 134–144)
Total Protein: 7.1 g/dL (ref 6.0–8.5)

## 2019-12-30 LAB — CBC
Hematocrit: 41.2 % (ref 34.0–46.6)
Hemoglobin: 12.2 g/dL (ref 11.1–15.9)
MCH: 22.9 pg — ABNORMAL LOW (ref 26.6–33.0)
MCHC: 29.6 g/dL — ABNORMAL LOW (ref 31.5–35.7)
MCV: 77 fL — ABNORMAL LOW (ref 79–97)
Platelets: 321 10*3/uL (ref 150–450)
RBC: 5.33 x10E6/uL — ABNORMAL HIGH (ref 3.77–5.28)
RDW: 19.1 % — ABNORMAL HIGH (ref 11.7–15.4)
WBC: 4.5 10*3/uL (ref 3.4–10.8)

## 2019-12-30 NOTE — Addendum Note (Signed)
Addended by: Jonah Blue B on: 12/30/2019 04:35 PM   Modules accepted: Orders

## 2019-12-31 ENCOUNTER — Telehealth: Payer: Self-pay

## 2019-12-31 NOTE — Telephone Encounter (Signed)
Contacted pt to go over lab results pt didn't answer lvm asking pt to give a call back Monday

## 2020-01-02 LAB — HEPATITIS C ANTIBODY: Hep C Virus Ab: <0.1 s/co ratio (ref 0.0–0.9)

## 2020-01-02 LAB — SPECIMEN STATUS REPORT

## 2020-02-14 ENCOUNTER — Other Ambulatory Visit: Payer: Self-pay | Admitting: Internal Medicine

## 2020-02-14 ENCOUNTER — Other Ambulatory Visit: Payer: Self-pay

## 2020-02-14 ENCOUNTER — Encounter: Payer: Self-pay | Admitting: Internal Medicine

## 2020-02-14 ENCOUNTER — Ambulatory Visit: Payer: Medicaid Other | Attending: Internal Medicine | Admitting: Internal Medicine

## 2020-02-14 ENCOUNTER — Ambulatory Visit (HOSPITAL_BASED_OUTPATIENT_CLINIC_OR_DEPARTMENT_OTHER): Payer: 59 | Admitting: Pharmacist

## 2020-02-14 VITALS — BP 138/90 | HR 84 | Resp 16 | Wt 220.4 lb

## 2020-02-14 DIAGNOSIS — Z23 Encounter for immunization: Secondary | ICD-10-CM | POA: Diagnosis not present

## 2020-02-14 DIAGNOSIS — I10 Essential (primary) hypertension: Secondary | ICD-10-CM | POA: Insufficient documentation

## 2020-02-14 DIAGNOSIS — Z3042 Encounter for surveillance of injectable contraceptive: Secondary | ICD-10-CM | POA: Diagnosis not present

## 2020-02-14 DIAGNOSIS — E6609 Other obesity due to excess calories: Secondary | ICD-10-CM

## 2020-02-14 DIAGNOSIS — R7303 Prediabetes: Secondary | ICD-10-CM

## 2020-02-14 DIAGNOSIS — R7989 Other specified abnormal findings of blood chemistry: Secondary | ICD-10-CM

## 2020-02-14 DIAGNOSIS — R945 Abnormal results of liver function studies: Secondary | ICD-10-CM

## 2020-02-14 HISTORY — DX: Prediabetes: R73.03

## 2020-02-14 LAB — POCT GLYCOSYLATED HEMOGLOBIN (HGB A1C): HbA1c POC (<> result, manual entry): 6 % (ref 4.0–5.6)

## 2020-02-14 LAB — POCT URINE PREGNANCY: Preg Test, Ur: NEGATIVE

## 2020-02-14 MED ORDER — MEDROXYPROGESTERONE ACETATE 150 MG/ML IM SUSP
150.0000 mg | Freq: Once | INTRAMUSCULAR | Status: AC
  Administered 2020-02-14: 150 mg via INTRAMUSCULAR

## 2020-02-14 MED ORDER — AMLODIPINE BESYLATE 5 MG PO TABS: 5.0000 mg | ORAL_TABLET | Freq: Every day | ORAL | 3 refills | Status: DC

## 2020-02-14 MED FILL — AMLODIPINE BESYLATE 5 MG TA: 5 | 30 days supply | Qty: 30 | Fill #0

## 2020-02-14 NOTE — Progress Notes (Signed)
Pt states she would like to be switched her birth control because she will forget to take her pill

## 2020-02-14 NOTE — Progress Notes (Signed)
Patient presents for vaccination against influenza per orders of Dr. Johnson. Consent given. Counseling provided. No contraindications exists. Vaccine administered without incident.  ° °Luke Van Ausdall, PharmD, CPP °Clinical Pharmacist °[facility] °[phone] ° °

## 2020-02-14 NOTE — Progress Notes (Signed)
Patient ID: Amanda Ellison, female    DOB: [birth year]  MRN: [id number]  CC: Contraception   Subjective: Amanda Ellison is a 29 y.o. female who presents for BP recheck and family planning Her concerns today include:  Patient with history of irregular menses.  Obesity, gestational diabetes, anemia.  Pt currently on BCP Norethindrone 0.35 mg daily.  She wants BC shot because she feels she will forgets to take pills consistently.  She has not missed any doses in past 2 wks. -Last menses was 12/08/19 and lasted 3 days.  Denies any history of blood clots in the legs or the lungs.  She had cholestasis of pregnancy.  Blood pressure was elevated on last visit.  DASH diet discussed and encouraged.  BP today is elevated.  She tells me that she has been trying to limit the salt in the foods. No device to check BP. No CP/SOB. No HA or dizziness.   Patient is breast-feeding.  On last visit she had complained of generalized itching without rash.  Itching has resolved. AST/ALT mildly elev on recent blood test.  Hep C screen negative.   Obesity/history of gestational diabetes: Patient reports she was told that the diabetes resolved after she gave birth.  She does not get in much exercise.  She feels she can do better with her eating habits. Patient Active Problem List   Diagnosis Date Noted  . Elevated blood-pressure reading without diagnosis of hypertension 12/29/2019  . Influenza vaccination declined 12/29/2019  . COVID-19 virus vaccination declined 12/29/2019  . Maternal anemia, with delivery 10/28/2019  . Postpartum care following vaginal delivery NSVD 6/23 10/27/2019  . Encounter for induction of labor 10/26/2019  . Cholestasis during pregnancy in third trimester 10/26/2019  . SVD (6/23) 10/26/2019  . First degree perineal laceration during delivery 10/26/2019  . Fetal renal anomaly, single gestation 09/22/2019  . Gestational diabetes mellitus (GDM) 09/22/2019  . Anemia 04/24/2016    . Rh negative, antepartum 10/22/2014  . Consanguinity 08/31/2014     Current Outpatient Medications on File Prior to Visit  Medication Sig Dispense Refill  . acetaminophen (TYLENOL) 500 MG tablet Take 1 tablet (500 mg total) by mouth every 8 (eight) hours as needed. 60 tablet 2  . diclofenac Sodium (VOLTAREN) 1 % GEL Apply 2 g topically 2 (two) times daily as needed. 100 g 1   No current facility-administered medications on file prior to visit.    No Known Allergies  Social History   Socioeconomic History  . Marital status: Married    Spouse name: [name]    . Number of children: 1  . Years of education: Not on file  . Highest education level: Not on file  Occupational History  . Occupation: Housekeeping  Tobacco Use  . Smoking status: Never Smoker  . Smokeless tobacco: Never Used  Vaping Use  . Vaping Use: Never used  Substance and Sexual Activity  . Alcohol use: No  . Drug use: No  . Sexual activity: Yes    Birth control/protection: Pill  Other Topics Concern  . Not on file  Social History Narrative  . Not on file   Social Determinants of Health   Financial Resource Strain:   . Difficulty of Paying Living Expenses: Not on file  Food Insecurity:   . Worried About Programme researcher, broadcasting/film/video in the Last Year: Not on file  . Ran Out of Food in the Last Year: Not on file  Transportation Needs:   . Lack  of Transportation (Medical): Not on file  . Lack of Transportation (Non-Medical): Not on file  Physical Activity:   . Days of Exercise per Week: Not on file  . Minutes of Exercise per Session: Not on file  Stress:   . Feeling of Stress : Not on file  Social Connections:   . Frequency of Communication with Friends and Family: Not on file  . Frequency of Social Gatherings with Friends and Family: Not on file  . Attends Religious Services: Not on file  . Active Member of Clubs or Organizations: Not on file  . Attends Banker Meetings: Not on file   . Marital Status: Not on file  Intimate Partner Violence:   . Fear of Current or Ex-Partner: Not on file  . Emotionally Abused: Not on file  . Physically Abused: Not on file  . Sexually Abused: Not on file    Family History  Problem Relation Age of Onset  . Alcohol abuse Neg Hx   . Arthritis Neg Hx   . Asthma Neg Hx   . Diabetes Neg Hx   . Drug abuse Neg Hx   . Early death Neg Hx   . Birth defects Neg Hx   . Cancer Neg Hx   . COPD Neg Hx   . Depression Neg Hx   . Hearing loss Neg Hx   . Heart disease Neg Hx   . Hyperlipidemia Neg Hx   . Hypertension Neg Hx   . Kidney disease Neg Hx   . Learning disabilities Neg Hx   . Mental illness Neg Hx   . Mental retardation Neg Hx   . Miscarriages / Stillbirths Neg Hx   . Stroke Neg Hx   . Vision loss Neg Hx   . Varicose Veins Neg Hx     No past surgical history on file.  ROS: Review of Systems Negative except as stated above  PHYSICAL EXAM: BP 138/90   Pulse 84   Resp 16   Wt 220 lb 6.4 oz (100 kg)   SpO2 100%   BMI 32.55 kg/m   Physical Exam  General appearance - alert, well appearing, young African female and in no distress Mental status - normal mood, behavior, speech, dress, motor activity, and thought processes Neck - supple, no significant adenopathy Chest - clear to auscultation, no wheezes, rales or rhonchi, symmetric air entry Heart - normal rate, regular rhythm, normal S1, S2, no murmurs, rubs, clicks or gallops Extremities - peripheral pulses normal, no pedal edema, no clubbing or cyanosis   CMP Latest Ref Rng & Units 12/29/2019 10/25/2019 09/22/2019  Glucose 65 - 99 mg/dL 86 93 160(F)  BUN 6 - 20 mg/dL 10 <0(X) <3(A)  Creatinine 0.57 - 1.00 mg/dL 3.55 7.32 2.02  Sodium 134 - 144 mmol/L 140 132(L) 135  Potassium 3.5 - 5.2 mmol/L 4.5 3.8 3.9  Chloride 96 - 106 mmol/L 103 105 104  CO2 20 - 29 mmol/L 22 18(L) 22  Calcium 8.7 - 10.2 mg/dL 9.7 8.9 9.0  Total Protein 6.0 - 8.5 g/dL 7.1 6.3(L) 6.2(L)  Total  Bilirubin 0.0 - 1.2 mg/dL 0.3 0.5 0.6  Alkaline Phos 48 - 121 IU/L 107 160(H) 137(H)  AST 0 - 40 IU/L 45(H) 24 29  ALT 0 - 32 IU/L 69(H) 18 25   Lipid Panel  No results found for: CHOL, TRIG, HDL, CHOLHDL, VLDL, LDLCALC, LDLDIRECT  CBC    Component Value Date/Time   WBC 4.5 12/29/2019 0950  WBC 9.3 10/27/2019 1632   RBC 5.33 (H) 12/29/2019 0950   RBC 4.22 10/27/2019 1632   HGB 12.2 12/29/2019 0950   HCT 41.2 12/29/2019 0950   PLT 321 12/29/2019 0950   MCV 77 (L) 12/29/2019 0950   MCH 22.9 (L) 12/29/2019 0950   MCH 23.5 (L) 10/27/2019 1632   MCHC 29.6 (L) 12/29/2019 0950   MCHC 30.0 10/27/2019 1632   RDW 19.1 (H) 12/29/2019 0950   LYMPHSABS 1.6 10/25/2019 1518   MONOABS 0.6 10/25/2019 1518   EOSABS 0.0 10/25/2019 1518   BASOSABS 0.0 10/25/2019 1518   Results for orders placed or performed in visit on 02/14/20  POCT glycosylated hemoglobin (Hb A1C)  Result Value Ref Range   Hemoglobin A1C     HbA1c POC (<> result, manual entry) 6.0 4.0 - 5.6 %   HbA1c, POC (prediabetic range)     HbA1c, POC (controlled diabetic range)    POCT urine pregnancy  Result Value Ref Range   Preg Test, Ur Negative Negative     ASSESSMENT AND PLAN: 1. Essential hypertension Blood pressure remains elevated. I recommend that we start her on antihypertensive medication.  I reviewed amlodipine on up-to-date on with our clinical pharmacist.  Appears to be relatively safe in breast-feeding - amLODipine (NORVASC) 5 MG tablet; Take 1 tablet (5 mg total) by mouth daily.  Dispense: 30 tablet; Refill: 3  2. Prediabetes 3. Class 1 obesity due to excess calories with serious comorbidity in adult, unspecified BMI -Discussed the importance of healthy eating habits and regular exercise to prevent progression to diabetes.  Advised patient to try to get out and walk at least 4 days a week for 30 minutes. Encourage her to cut back on the amount of white carbohydrates that she eats.  Advised to eliminate sugary  drinks from the diet and to drink more water.  Encourage several servings of fresh fruits and vegetables daily. - POCT glycosylated hemoglobin (Hb A1C)   4. Depot contraception I discussed other long-acting forms of birth control with her including IUD and Implanon.  I went over possible side effects of Depo-Provera shot including weight gain and unpredictable breakthrough bleeding.  Given that she is already overweight, I recommend considering the IUD instead but patient insists that she would like to get the Depo shot. -Depo shot given after pregnancy test was negative.  Advised patient to use backup method like condoms for at least a week post Depo shot.  She will stop the oral birth control. - POCT urine pregnancy - medroxyPROGESTERone (DEPO-PROVERA) injection 150 mg  5. Abnormal LFTs - Hepatic Function Panel - Hepatitis B surface antigen - Hepatitis B surface antibody,quantitative    Patient was given the opportunity to ask questions.  Patient verbalized understanding of the plan and was able to repeat key elements of the plan.   Orders Placed This Encounter  Procedures  . Hepatic Function Panel  . Hepatitis B surface antigen  . Hepatitis B surface antibody,quantitative  . POCT glycosylated hemoglobin (Hb A1C)  . POCT urine pregnancy     Requested Prescriptions   Signed Prescriptions Disp Refills  . amLODipine (NORVASC) 5 MG tablet 30 tablet 3    Sig: Take 1 tablet (5 mg total) by mouth daily.    Return in about 3 months (around 05/16/2020) for 3 weeks with Franky Macho for BP check.  Nurse visit in 3 mths for depo shot.  Jonah Blue, MD, FACP

## 2020-02-14 NOTE — Patient Instructions (Addendum)
Your blood pressure is elevated.  We have started you on a low-dose of a blood pressure medication called amlodipine 5 mg daily.  Our goal is to get your blood pressure 130/80 or lower.  We have started you on the Depo-Provera birth control shot today.  You will need to obtain the shot every 3 months.  As we discussed today, the birth control shot can cause weight gain.  It usually causes you not to have a menstrual cycle but sometimes it can cause irregular and unpredictable vaginal bleeding.   Prediabetes Eating Plan Prediabetes is a condition that causes blood sugar (glucose) levels to be higher than normal. This increases the risk for developing diabetes. In order to prevent diabetes from developing, your health care provider may recommend a diet and other lifestyle changes to help you:  Control your blood glucose levels.  Improve your cholesterol levels.  Manage your blood pressure. Your health care provider may recommend working with a diet and nutrition specialist (dietitian) to make a meal plan that is best for you. What are tips for following this plan? Lifestyle  Set weight loss goals with the help of your health care team. It is recommended that most people with prediabetes lose 7% of their current body weight.  Exercise for at least 30 minutes at least 5 days a week.  Attend a support group or seek ongoing support from a mental health counselor.  Take over-the-counter and prescription medicines only as told by your health care provider. Reading food labels  Read food labels to check the amount of fat, salt (sodium), and sugar in prepackaged foods. Avoid foods that have: ? Saturated fats. ? Trans fats. ? Added sugars.  Avoid foods that have more than 300 milligrams (mg) of sodium per serving. Limit your daily sodium intake to less than 2,300 mg each day. Shopping  Avoid buying pre-made and processed foods. Cooking  Cook with olive oil. Do not use butter, lard, or  ghee.  Bake, broil, grill, or boil foods. Avoid frying. Meal planning   Work with your dietitian to develop an eating plan that is right for you. This may include: ? Tracking how many calories you take in. Use a food diary, notebook, or mobile application to track what you eat at each meal. ? Using the glycemic index (GI) to plan your meals. The index tells you how quickly a food will raise your blood glucose. Choose low-GI foods. These foods take a longer time to raise blood glucose.  Consider following a Mediterranean diet. This diet includes: ? Several servings each day of fresh fruits and vegetables. ? Eating fish at least twice a week. ? Several servings each day of whole grains, beans, nuts, and seeds. ? Using olive oil instead of other fats. ? Moderate alcohol consumption. ? Eating small amounts of red meat and whole-fat dairy.  If you have high blood pressure, you may need to limit your sodium intake or follow a diet such as the DASH eating plan. DASH is an eating plan that aims to lower high blood pressure. What foods are recommended? The items listed below may not be a complete list. Talk with your dietitian about what dietary choices are best for you. Grains Whole grains, such as whole-wheat or whole-grain breads, crackers, cereals, and pasta. Unsweetened oatmeal. Bulgur. Barley. Quinoa. Brown rice. Corn or whole-wheat flour tortillas or taco shells. Vegetables Lettuce. Spinach. Peas. Beets. Cauliflower. Cabbage. Broccoli. Carrots. Tomatoes. Squash. Eggplant. Herbs. Peppers. Onions. Cucumbers. Brussels sprouts. Fruits  Berries. Bananas. Apples. Oranges. Grapes. Papaya. Mango. Pomegranate. Kiwi. Grapefruit. Cherries. Meats and other protein foods Seafood. Poultry without skin. Lean cuts of pork and beef. Tofu. Eggs. Nuts. Beans. Dairy Low-fat or fat-free dairy products, such as yogurt, cottage cheese, and cheese. Beverages Water. Tea. Coffee. Sugar-free or diet soda. Seltzer  water. Lowfat or no-fat milk. Milk alternatives, such as soy or almond milk. Fats and oils Olive oil. Canola oil. Sunflower oil. Grapeseed oil. Avocado. Walnuts. Sweets and desserts Sugar-free or low-fat pudding. Sugar-free or low-fat ice cream and other frozen treats. Seasoning and other foods Herbs. Sodium-free spices. Mustard. Relish. Low-fat, low-sugar ketchup. Low-fat, low-sugar barbecue sauce. Low-fat or fat-free mayonnaise. What foods are not recommended? The items listed below may not be a complete list. Talk with your dietitian about what dietary choices are best for you. Grains Refined white flour and flour products, such as bread, pasta, snack foods, and cereals. Vegetables Canned vegetables. Frozen vegetables with butter or cream sauce. Fruits Fruits canned with syrup. Meats and other protein foods Fatty cuts of meat. Poultry with skin. Breaded or fried meat. Processed meats. Dairy Full-fat yogurt, cheese, or milk. Beverages Sweetened drinks, such as sweet iced tea and soda. Fats and oils Butter. Lard. Ghee. Sweets and desserts Baked goods, such as cake, cupcakes, pastries, cookies, and cheesecake. Seasoning and other foods Spice mixes with added salt. Ketchup. Barbecue sauce. Mayonnaise. Summary  To prevent diabetes from developing, you may need to make diet and other lifestyle changes to help control blood sugar, improve cholesterol levels, and manage your blood pressure.  Set weight loss goals with the help of your health care team. It is recommended that most people with prediabetes lose 7 percent of their current body weight.  Consider following a Mediterranean diet that includes plenty of fresh fruits and vegetables, whole grains, beans, nuts, seeds, fish, lean meat, low-fat dairy, and healthy oils. This information is not intended to replace advice given to you by your health care provider. Make sure you discuss any questions you have with your health care  provider. Document Revised: 08/13/2018 Document Reviewed: 06/25/2016 Elsevier Patient Education  2020 [employer].   Medroxyprogesterone injection [Contraceptive] What is this medicine? MEDROXYPROGESTERONE (me DROX ee proe JES te rone) contraceptive injections prevent pregnancy. They provide effective birth control for 3 months. Depo-subQ Provera 104 is also used for treating pain related to endometriosis. This medicine may be used for other purposes; ask your health care provider or pharmacist if you have questions. COMMON BRAND NAME(S): Depo-Provera, Depo-subQ Provera 104 What should I tell my health care provider before I take this medicine? They need to know if you have any of these conditions:  frequently drink alcohol  asthma  blood vessel disease or a history of a blood clot in the lungs or legs  bone disease such as osteoporosis  breast cancer  diabetes  eating disorder (anorexia nervosa or bulimia)  high blood pressure  HIV infection or AIDS  kidney disease  liver disease  mental depression  migraine  seizures (convulsions)  stroke  tobacco smoker  vaginal bleeding  an unusual or allergic reaction to medroxyprogesterone, other hormones, medicines, foods, dyes, or preservatives  pregnant or trying to get pregnant  breast-feeding How should I use this medicine? Depo-Provera Contraceptive injection is given into a muscle. Depo-subQ Provera 104 injection is given under the skin. These injections are given by a health care professional. You must not be pregnant before getting an injection. The injection is usually given during  the first 5 days after the start of a menstrual period or 6 weeks after delivery of a baby. Talk to your pediatrician regarding the use of this medicine in children. Special care may be needed. These injections have been used in female children who have started having menstrual periods. Overdosage: If you think you have taken too  much of this medicine contact a poison control center or emergency room at once. NOTE: This medicine is only for you. Do not share this medicine with others. What if I miss a dose? Try not to miss a dose. You must get an injection once every 3 months to maintain birth control. If you cannot keep an appointment, call and reschedule it. If you wait longer than 13 weeks between Depo-Provera contraceptive injections or longer than 14 weeks between Depo-subQ Provera 104 injections, you could get pregnant. Use another method for birth control if you miss your appointment. You may also need a pregnancy test before receiving another injection. What may interact with this medicine? Do not take this medicine with any of the following medications:  bosentan This medicine may also interact with the following medications:  aminoglutethimide  antibiotics or medicines for infections, especially rifampin, rifabutin, rifapentine, and griseofulvin  aprepitant  barbiturate medicines such as phenobarbital or primidone  bexarotene  carbamazepine  medicines for seizures like ethotoin, felbamate, oxcarbazepine, phenytoin, topiramate  modafinil  St. John's wort This list may not describe all possible interactions. Give your health care provider a list of all the medicines, herbs, non-prescription drugs, or dietary supplements you use. Also tell them if you smoke, drink alcohol, or use illegal drugs. Some items may interact with your medicine. What should I watch for while using this medicine? This drug does not protect you against HIV infection (AIDS) or other sexually transmitted diseases. Use of this product may cause you to lose calcium from your bones. Loss of calcium may cause weak bones (osteoporosis). Only use this product for more than 2 years if other forms of birth control are not right for you. The longer you use this product for birth control the more likely you will be at risk for weak bones. Ask  your health care professional how you can keep strong bones. You may have a change in bleeding pattern or irregular periods. Many females stop having periods while taking this drug. If you have received your injections on time, your chance of being pregnant is very low. If you think you may be pregnant, see your health care professional as soon as possible. Tell your health care professional if you want to get pregnant within the next year. The effect of this medicine may last a long time after you get your last injection. What side effects may I notice from receiving this medicine? Side effects that you should report to your doctor or health care professional as soon as possible:  allergic reactions like skin rash, itching or hives, swelling of the face, lips, or tongue  breast tenderness or discharge  breathing problems  changes in vision  depression  feeling faint or lightheaded, falls  fever  pain in the abdomen, chest, groin, or leg  problems with balance, talking, walking  unusually weak or tired  yellowing of the eyes or skin Side effects that usually do not require medical attention (report to your doctor or health care professional if they continue or are bothersome):  acne  fluid retention and swelling  headache  irregular periods, spotting, or absent periods  temporary  pain, itching, or skin reaction at site where injected  weight gain This list may not describe all possible side effects. Call your doctor for medical advice about side effects. You may report side effects to FDA at 1-800-FDA-1088. Where should I keep my medicine? This does not apply. The injection will be given to you by a health care professional. NOTE: This sheet is a summary. It may not cover all possible information. If you have questions about this medicine, talk to your doctor, pharmacist, or health care provider.  2020 Elsevier/Gold Standard (2008-05-12 18:37:56)   Influenza Virus  Vaccine injection (Fluarix) What is this medicine? INFLUENZA VIRUS VACCINE (in floo EN zuh VAHY ruhs vak SEEN) helps to reduce the risk of getting influenza also known as the flu. This medicine may be used for other purposes; ask your health care provider or pharmacist if you have questions. COMMON BRAND NAME(S): Fluarix, Fluzone What should I tell my health care provider before I take this medicine? They need to know if you have any of these conditions:  bleeding disorder like hemophilia  fever or infection  Guillain-Barre syndrome or other neurological problems  immune system problems  infection with the human immunodeficiency virus (HIV) or AIDS  low blood platelet counts  multiple sclerosis  an unusual or allergic reaction to influenza virus vaccine, eggs, chicken proteins, latex, gentamicin, other medicines, foods, dyes or preservatives  pregnant or trying to get pregnant  breast-feeding How should I use this medicine? This vaccine is for injection into a muscle. It is given by a health care professional. A copy of Vaccine Information Statements will be given before each vaccination. Read this sheet carefully each time. The sheet may change frequently. Talk to your pediatrician regarding the use of this medicine in children. Special care may be needed. Overdosage: If you think you have taken too much of this medicine contact a poison control center or emergency room at once. NOTE: This medicine is only for you. Do not share this medicine with others. What if I miss a dose? This does not apply. What may interact with this medicine?  chemotherapy or radiation therapy  medicines that lower your immune system like etanercept, anakinra, infliximab, and adalimumab  medicines that treat or prevent blood clots like warfarin  phenytoin  steroid medicines like prednisone or cortisone  theophylline  vaccines This list may not describe all possible interactions. Give your  health care provider a list of all the medicines, herbs, non-prescription drugs, or dietary supplements you use. Also tell them if you smoke, drink alcohol, or use illegal drugs. Some items may interact with your medicine. What should I watch for while using this medicine? Report any side effects that do not go away within 3 days to your doctor or health care professional. Call your health care provider if any unusual symptoms occur within 6 weeks of receiving this vaccine. You may still catch the flu, but the illness is not usually as bad. You cannot get the flu from the vaccine. The vaccine will not protect against colds or other illnesses that may cause fever. The vaccine is needed every year. What side effects may I notice from receiving this medicine? Side effects that you should report to your doctor or health care professional as soon as possible:  allergic reactions like skin rash, itching or hives, swelling of the face, lips, or tongue Side effects that usually do not require medical attention (report to your doctor or health care professional if they continue  or are bothersome):  fever  headache  muscle aches and pains  pain, tenderness, redness, or swelling at site where injected  weak or tired This list may not describe all possible side effects. Call your doctor for medical advice about side effects. You may report side effects to FDA at 1-800-FDA-1088. Where should I keep my medicine? This vaccine is only given in a clinic, pharmacy, doctor's office, or other health care setting and will not be stored at home. NOTE: This sheet is a summary. It may not cover all possible information. If you have questions about this medicine, talk to your doctor, pharmacist, or health care provider.  2020 Elsevier/Gold Standard (2007-11-17 09:30:40)

## 2020-02-15 LAB — HEPATITIS B SURFACE ANTIBODY, QUANTITATIVE: Hepatitis B Surf Ab Quant: <3.1 m[IU]/mL — ABNORMAL LOW (ref >9.9)

## 2020-02-15 LAB — HEPATIC FUNCTION PANEL
ALT: 69 IU/L — ABNORMAL HIGH (ref 0–32)
AST: 44 IU/L — ABNORMAL HIGH (ref 0–40)
Albumin: 4.2 g/dL (ref 3.9–5.0)
Alkaline Phosphatase: 132 IU/L — ABNORMAL HIGH (ref 44–121)
Bilirubin Total: 0.3 mg/dL (ref 0.0–1.2)
Bilirubin, Direct: 0.11 mg/dL (ref 0.00–0.40)
Total Protein: 7.1 g/dL (ref 6.0–8.5)

## 2020-02-15 LAB — HEPATITIS B SURFACE ANTIGEN: Hepatitis B Surface Ag: NEGATIVE

## 2020-02-15 NOTE — Progress Notes (Signed)
Let patient know that she still has a mild but stable elevation in her liver enzymes.  We will plan to recheck her levels again on her follow-up visit with me in January.  Screen for hepatitis B is negative.  Test also shows that she is not adequately immunized against hepatitis B.  We can give the vaccine on her next visit if she would like to get it.

## 2020-03-06 ENCOUNTER — Ambulatory Visit: Payer: 59 | Admitting: Pharmacist

## 2020-05-01 ENCOUNTER — Ambulatory Visit: Payer: 59 | Attending: Internal Medicine

## 2020-05-01 ENCOUNTER — Other Ambulatory Visit: Payer: Self-pay

## 2020-05-01 ENCOUNTER — Telehealth: Payer: Self-pay

## 2020-05-01 ENCOUNTER — Other Ambulatory Visit: Payer: Self-pay | Admitting: Family Medicine

## 2020-05-01 MED ORDER — NORETHINDRONE 0.35 MG PO TABS: 1.0000 | ORAL_TABLET | Freq: Every day | ORAL | 6 refills | Status: DC

## 2020-05-01 NOTE — Telephone Encounter (Signed)
Prescription has been sent to the pharmacy. 

## 2020-05-01 NOTE — Telephone Encounter (Signed)
Patient came in today for her DEPO appt on the nurse schedule. Pt. Stated she no longer want to be on the DEPO injection bc she is gaining weight, and would like PCP to prescribe her pill contraception; Norethindrone 0.35MG  TAB to [facility] pharmacy.

## 2020-05-02 MED FILL — NORETHINDRONE 0.35 MG TABS: 0.35 | 28 days supply | Qty: 28 | Fill #0

## 2020-05-02 NOTE — Telephone Encounter (Signed)
Spoke to patient and informed on Rx. Pt. Understood w/ [employer] interpreter assist w/ the call.

## 2020-05-17 ENCOUNTER — Ambulatory Visit: Payer: 59 | Admitting: Internal Medicine

## 2020-05-24 MED FILL — NORETHINDRONE 0.35 MG TABS: 0.35 | 28 days supply | Qty: 28 | Fill #1

## 2020-07-06 ENCOUNTER — Ambulatory Visit: Payer: 59 | Admitting: Internal Medicine

## 2021-01-01 ENCOUNTER — Ambulatory Visit: Payer: 59 | Admitting: Internal Medicine

## 2021-07-25 ENCOUNTER — Ambulatory Visit: Payer: Self-pay | Admitting: *Deleted

## 2021-07-25 NOTE — Telephone Encounter (Signed)
? ? ?  Chief Complaint: Headache ?Symptoms: Headache 3/10 relieved with tylenol, runny nose,cold like symptoms ?Frequency: onset Monday, comes and goes ?Pertinent Negatives: Patient denies  ?Disposition: [] ED /[] Urgent Care (no appt availability in office) / [] Appointment(In office/virtual)/ []  [place] [facility]/ [x] Home Care/ [] Refused Recommended Disposition /[] [place] [facility]/ []  Follow-up with PCP ?Additional Notes: Difficult call with Interpreter, "Different dialect" background noise with yelling children, pt yelling at children.  Home care advise given. Advised UC/Ed for worsening symptoms. Pt then requested "Note from doctor as she did not work Monday with headache." Assisted by [employer] # [phone]  ? ? Reason for Disposition ? Similar to previously diagnosed muscle-tension headaches ? ?Answer Assessment - Initial Assessment Questions ?1. LOCATION: "Where does it hurt?"  ?    Left side of head ?2. ONSET: "When did the headache start?" (Minutes, hours or days)  ?    Monday ?3. PATTERN: "Does the pain come and go, or has it been constant since it started?" ?    Comes and goes ?4. SEVERITY: "How bad is the pain?" and "What does it keep you from doing?"  (e.g., Scale 1-10; mild, moderate, or severe) ?  - MILD (1-3): doesn't interfere with normal activities  ?  - MODERATE (4-7): interferes with normal activities or awakens from sleep  ?  - SEVERE (8-10): excruciating pain, unable to do any normal activities    ?    3/10 ?5. RECURRENT SYMPTOM: "Have you ever had headaches before?" If Yes, ask: "When was the last time?" and "What happened that time?"  ?     ?6. CAUSE: "What do you think is causing the headache?" ?     ?7. MIGRAINE: "Have you been diagnosed with migraine headaches?" If Yes, ask: "Is this headache similar?"  ?     ?8. HEAD INJURY: "Has there been any recent injury to the head?"  ?     ?9. OTHER SYMPTOMS: "Do you have any other symptoms?" (fever, stiff neck, eye pain, sore throat,  cold symptoms) ?    Cold, runny nose ? ?Protocols used: Headache-A-AH ? ?

## 2021-07-25 NOTE — Telephone Encounter (Signed)
Summary: eels very cold and has a headache  ? Pt stated that she feels very cold and has a headache. Pt stated this started yesterday   ? ?No appointments available.  ? ?Seeking clinical advice.   ?  ? ? ?Called patient via interpreter ID# [phone] to review sx of headache and feeling cold. No answer, LVMTCB [phone]. ?

## 2021-07-26 NOTE — Telephone Encounter (Signed)
Reached out to pt. Pt states her headaches are better. Made pt aware that over the weekend if she starts having headaches again she will need to be seen at the urgent care. Pt asked for an appt for for headaches in  office. Scheduled an appt with [place] 3/28 at 950am  ?

## 2021-07-30 ENCOUNTER — Ambulatory Visit: Payer: 59 | Admitting: Family Medicine

## 2021-10-29 ENCOUNTER — Ambulatory Visit: Payer: 59 | Admitting: Internal Medicine

## 2023-02-24 ENCOUNTER — Inpatient Hospital Stay (HOSPITAL_COMMUNITY)
Admission: EM | Admit: 2023-02-24 | Discharge: 2023-02-24 | Disposition: A | Discharge status: Home / Self Care | Payer: Medicaid Other | Attending: Emergency Medicine | Admitting: Emergency Medicine

## 2023-02-24 ENCOUNTER — Inpatient Hospital Stay (HOSPITAL_COMMUNITY): Payer: Medicaid Other

## 2023-02-24 ENCOUNTER — Other Ambulatory Visit: Payer: Self-pay

## 2023-02-24 ENCOUNTER — Encounter (HOSPITAL_COMMUNITY): Payer: Self-pay | Admitting: Obstetrics and Gynecology

## 2023-02-24 DIAGNOSIS — O26891 Other specified pregnancy related conditions, first trimester: Secondary | ICD-10-CM | POA: Diagnosis not present

## 2023-02-24 DIAGNOSIS — J988 Other specified respiratory disorders: Secondary | ICD-10-CM | POA: Diagnosis not present

## 2023-02-24 DIAGNOSIS — O99511 Diseases of the respiratory system complicating pregnancy, first trimester: Secondary | ICD-10-CM | POA: Insufficient documentation

## 2023-02-24 DIAGNOSIS — R109 Unspecified abdominal pain: Secondary | ICD-10-CM | POA: Diagnosis not present

## 2023-02-24 DIAGNOSIS — O009 Unspecified ectopic pregnancy without intrauterine pregnancy: Secondary | ICD-10-CM | POA: Insufficient documentation

## 2023-02-24 DIAGNOSIS — Z3490 Encounter for supervision of normal pregnancy, unspecified, unspecified trimester: Secondary | ICD-10-CM

## 2023-02-24 DIAGNOSIS — Z3A01 Less than 8 weeks gestation of pregnancy: Secondary | ICD-10-CM | POA: Diagnosis not present

## 2023-02-24 DIAGNOSIS — O98511 Other viral diseases complicating pregnancy, first trimester: Secondary | ICD-10-CM | POA: Insufficient documentation

## 2023-02-24 DIAGNOSIS — Z3201 Encounter for pregnancy test, result positive: Secondary | ICD-10-CM | POA: Insufficient documentation

## 2023-02-24 DIAGNOSIS — U071 COVID-19: Secondary | ICD-10-CM

## 2023-02-24 DIAGNOSIS — O26899 Other specified pregnancy related conditions, unspecified trimester: Secondary | ICD-10-CM

## 2023-02-24 LAB — POCT PREGNANCY, URINE: Preg Test, Ur: POSITIVE — AB

## 2023-02-24 LAB — CBC
HCT: 41.6 % (ref 36.0–46.0)
Hemoglobin: 13.4 g/dL (ref 12.0–15.0)
MCH: 26.5 pg (ref 26.0–34.0)
MCHC: 32.2 g/dL (ref 30.0–36.0)
MCV: 82.4 fL (ref 80.0–100.0)
Platelets: 309 10*3/uL (ref 150–400)
RBC: 5.05 MIL/uL (ref 3.87–5.11)
RDW: 13.6 % (ref 11.5–15.5)
WBC: 5.5 10*3/uL (ref 4.0–10.5)
nRBC: 0 % (ref 0.0–0.2)

## 2023-02-24 LAB — COMPREHENSIVE METABOLIC PANEL
ALT: 18 U/L (ref 0–44)
AST: 20 U/L (ref 15–41)
Albumin: 3.8 g/dL (ref 3.5–5.0)
Alkaline Phosphatase: 45 U/L (ref 38–126)
Anion gap: 10 (ref 5–15)
BUN: 8 mg/dL (ref 6–20)
CO2: 23 mmol/L (ref 22–32)
Calcium: 9.5 mg/dL (ref 8.9–10.3)
Chloride: 104 mmol/L (ref 98–111)
Creatinine, Ser: 0.64 mg/dL (ref 0.44–1.00)
GFR, Estimated: >60 mL/min (ref >60)
Glucose, Bld: 91 mg/dL (ref 70–99)
Potassium: 4.1 mmol/L (ref 3.5–5.1)
Sodium: 137 mmol/L (ref 135–145)
Total Bilirubin: 0.8 mg/dL (ref 0.3–1.2)
Total Protein: 7.2 g/dL (ref 6.5–8.1)

## 2023-02-24 LAB — WET PREP, GENITAL
Clue Cells Wet Prep HPF POC: NONE SEEN
Sperm: NONE SEEN
Trich, Wet Prep: NONE SEEN
WBC, Wet Prep HPF POC: <10 (ref <10)
Yeast Wet Prep HPF POC: NONE SEEN

## 2023-02-24 LAB — ABO/RH
ABO/RH(D): O NEG
Antibody Screen: NEGATIVE

## 2023-02-24 LAB — HCG, SERUM, QUALITATIVE: Preg, Serum: POSITIVE — AB

## 2023-02-24 LAB — SARS CORONAVIRUS 2 BY RT PCR: SARS Coronavirus 2 by RT PCR: POSITIVE — AB

## 2023-02-24 LAB — HCG, QUANTITATIVE, PREGNANCY: hCG, Beta Chain, Quant, S: 21320 m[IU]/mL — ABNORMAL HIGH (ref <5)

## 2023-02-24 MED ORDER — NIFEDIPINE ER OSMOTIC RELEASE 30 MG PO TB24: 30.0000 mg | ORAL_TABLET | Freq: Every day | ORAL | 2 refills | Status: DC

## 2023-02-24 NOTE — ED Triage Notes (Signed)
Patient reports generalized body aches since Sunday, feeling hot since yesterday, and states LMP 8/30.

## 2023-02-24 NOTE — MAU Provider Note (Signed)
History     CSN: [id number]  Arrival date and time: 02/24/23 0258   Event Date/Time   First Provider Initiated Contact with Patient 02/24/23 1448      Chief Complaint  Patient presents with   Possible Pregnancy   Generalized Body Aches   Possible Pregnancy Associated symptoms include congestion. Pertinent negatives include no abdominal pain, chest pain, chills, coughing, fever, nausea, rash, sore throat or vomiting.   Patient is 32 y.o. G3P2002 ~8 weeks by LMP here with complaints of abdominal pain, mostly yesterday with improvement today. Reports no bleeding. She is having body aches and reports no fevers. She has mild congestion. She was sx with COVID at the ER.  denies LOF, VB, contractions, vaginal discharge.  OB History     Gravida  3   Para  2   Term  2   Preterm      AB      Living  2      SAB      IAB      Ectopic      Multiple  0   Live Births  2           Past Medical History:  Diagnosis Date   Cholelithiasis affecting pregnancy, antepartum    Gestational diabetes    Hypertension     History reviewed. No pertinent surgical history.  Family History  Problem Relation Age of Onset   Alcohol abuse Neg Hx    Arthritis Neg Hx    Asthma Neg Hx    Diabetes Neg Hx    Drug abuse Neg Hx    Early death Neg Hx    Birth defects Neg Hx    Cancer Neg Hx    COPD Neg Hx    Depression Neg Hx    Hearing loss Neg Hx    Heart disease Neg Hx    Hyperlipidemia Neg Hx    Hypertension Neg Hx    Kidney disease Neg Hx    Learning disabilities Neg Hx    Mental illness Neg Hx    Mental retardation Neg Hx    Miscarriages / Stillbirths Neg Hx    Stroke Neg Hx    Vision loss Neg Hx    Varicose Veins Neg Hx     Social History   Tobacco Use   Smoking status: Never   Smokeless tobacco: Never  Vaping Use   Vaping status: Never Used  Substance Use Topics   Alcohol use: No   Drug use: No    Allergies: No Known Allergies  Medications Prior to  Admission  Medication Sig Dispense Refill Last Dose   amLODipine (NORVASC) 5 MG tablet TAKE 1 TABLET (5 MG TOTAL) BY MOUTH DAILY. 30 tablet 3    diclofenac Sodium (VOLTAREN) 1 % GEL Apply 2 g topically 2 (two) times daily as needed. 100 g 1    norethindrone (MICRONOR) 0.35 MG tablet TAKE 1 TABLET (0.35 MG TOTAL) BY MOUTH DAILY. 28 tablet 6     Review of Systems  Constitutional:  Negative for chills and fever.  HENT:  Positive for congestion. Negative for sore throat.   Eyes:  Negative for pain and visual disturbance.  Respiratory:  Negative for cough, chest tightness and shortness of breath.   Cardiovascular:  Negative for chest pain.  Gastrointestinal:  Negative for abdominal pain, diarrhea, nausea and vomiting.  Endocrine: Negative for cold intolerance and heat intolerance.  Genitourinary:  Negative for dysuria and flank pain.  Musculoskeletal:  Negative for back pain.  Skin:  Negative for rash.  Allergic/Immunologic: Negative for food allergies.  Neurological:  Negative for dizziness and light-headedness.  Psychiatric/Behavioral:  Negative for agitation.    Physical Exam   Blood pressure (!) 145/84, pulse 79, temperature 99.5 F (37.5 C), temperature source Oral, resp. rate 15, last menstrual period 01/02/2023, SpO2 100%, unknown if currently breastfeeding.  Patient Vitals for the past 24 hrs:  BP Temp Temp src Pulse Resp SpO2  02/24/23 1510 130/74 -- -- 76 -- 90 %  02/24/23 1446 (!) 145/84 -- -- 79 -- --  02/24/23 1445 -- 99.5 F (37.5 C) Oral -- -- 100 %  02/24/23 1335 -- 98 F (36.7 C) Oral -- -- --  02/24/23 1330 124/82 -- -- 72 15 98 %  02/24/23 1300 (!) 129/95 -- -- 71 12 100 %  02/24/23 1200 127/81 -- -- 82 17 100 %  02/24/23 0822 (!) 149/105 98.2 F (36.8 C) Oral 86 17 100 %    Physical Exam Vitals and nursing note reviewed.  Constitutional:      General: She is not in acute distress.    Appearance: She is well-developed.  HENT:     Head: Normocephalic and  atraumatic.  Eyes:     General: No scleral icterus.    Conjunctiva/sclera: Conjunctivae normal.  Cardiovascular:     Rate and Rhythm: Normal rate.  Pulmonary:     Effort: Pulmonary effort is normal.  Chest:     Chest wall: No tenderness.  Abdominal:     Palpations: Abdomen is soft.     Tenderness: There is no abdominal tenderness. There is no guarding or rebound.  Genitourinary:    Vagina: Normal.  Musculoskeletal:        General: Normal range of motion.     Cervical back: Normal range of motion and neck supple.  Skin:    General: Skin is warm and dry.     Findings: No rash.  Neurological:     Mental Status: She is alert and oriented to person, place, and time.    MAU Course  Procedures  MDM MDM: high  This patient presents to the ED for concern of   Chief Complaint  Patient presents with   Possible Pregnancy   Generalized Body Aches     These complains involves an extensive number of treatment options, and is a complaint that carries with it a high risk of complications and morbidity.  The differential diagnosis for  1.abdominal pain in early pregnancy INCLUDES threatened miscarriage, ectopic pregnancy (unless IUP confirmed), pelvic infection, urinary tract infection or normal variant ligament related discomfort with live IUP.  Rare but possible DDX include appendicitis. Most likely for this patient is normal variant because US showed IUP   Co morbidities that complicate the patient evaluation: none per patient  Interpreter services used: yes  External records from outside source obtained and reviewed including Scanned media records, CareEverywhere, and Prenatal care records  Lab Tests: UA, CMP, CBC, Wet prep, BHCG, and Gonorrhea/Chlamydia   I ordered, and personally interpreted labs.  The pertinent results include:   Results for orders placed or performed during the hospital encounter of 02/24/23 (from the past 24 hour(s))  SARS Coronavirus 2 by RT PCR (hospital  order, performed in [facility] hospital lab) *cepheid single result test* Anterior Nasal Swab     Status: Abnormal   Collection Time: 02/24/23  8:24 AM   Specimen: Anterior Nasal Swab  Result Value Ref Range  SARS Coronavirus 2 by RT PCR POSITIVE (A) NEGATIVE  hCG, serum, qualitative     Status: Abnormal   Collection Time: 02/24/23 12:00 PM  Result Value Ref Range   Preg, Serum POSITIVE (A) NEGATIVE  Pregnancy, urine POC     Status: Abnormal   Collection Time: 02/24/23  1:58 PM  Result Value Ref Range   Preg Test, Ur POSITIVE (A) NEGATIVE  CBC     Status: None   Collection Time: 02/24/23  2:29 PM  Result Value Ref Range   WBC 5.5 4.0 - 10.5 K/uL   RBC 5.05 3.87 - 5.11 MIL/uL   Hemoglobin 13.4 12.0 - 15.0 g/dL   HCT 56.2 13.0 - 86.5 %   MCV 82.4 80.0 - 100.0 fL   MCH 26.5 26.0 - 34.0 pg   MCHC 32.2 30.0 - 36.0 g/dL   RDW 78.4 69.6 - 29.5 %   Platelets 309 150 - 400 K/uL   nRBC 0.0 0.0 - 0.2 %  ABO/Rh     Status: None   Collection Time: 02/24/23  2:29 PM  Result Value Ref Range   ABO/RH(D) O NEG    Antibody Screen      NEG Performed at [facility], [address], [place], [place] [address]   Comprehensive metabolic panel     Status: None   Collection Time: 02/24/23  2:29 PM  Result Value Ref Range   Sodium 137 135 - 145 mmol/L   Potassium 4.1 3.5 - 5.1 mmol/L   Chloride 104 98 - 111 mmol/L   CO2 23 22 - 32 mmol/L   Glucose, Bld 91 70 - 99 mg/dL   BUN 8 6 - 20 mg/dL   Creatinine, Ser 2.44 0.44 - 1.00 mg/dL   Calcium 9.5 8.9 - 01.0 mg/dL   Total Protein 7.2 6.5 - 8.1 g/dL   Albumin 3.8 3.5 - 5.0 g/dL   AST 20 15 - 41 U/L   ALT 18 0 - 44 U/L   Alkaline Phosphatase 45 38 - 126 U/L   Total Bilirubin 0.8 0.3 - 1.2 mg/dL   GFR, Estimated >27 >25 mL/min   Anion gap 10 5 - 15  Wet prep, genital     Status: None   Collection Time: 02/24/23  3:07 PM   Specimen: PATH Cytology Cervicovaginal Ancillary Only  Result Value Ref Range   Yeast Wet Prep HPF POC NONE SEEN  NONE SEEN   Trich, Wet Prep NONE SEEN NONE SEEN   Clue Cells Wet Prep HPF POC NONE SEEN NONE SEEN   WBC, Wet Prep HPF POC <10 <10   Sperm NONE SEEN      Imaging Studies ordered:  I ordered imaging studies includingTransvaginal [place] I independently visualized and interpreted imaging which showed IUP with Gestational Sac measuring [redacted]w[redacted]d EDD 10/19/23. YS present but no FHR I agree with the radiologist interpretation   Reevaluation of the patient after these medicines showed that the patient improved I have reviewed the patients home medicines and have made adjustments as needed  Test Considered: None  Critical Interventions: Other Evaluation for ectopic   MAU Course:  4:47 PM Updated patient about IUP with earlier dating than thought. Will need outpatient [place] to confirm viability.     After the interventions noted above, I reevaluated the patient and found that they have :improved  Dispostion: discharged   Assessment and Plan   1. Abdominal pain affecting pregnancy   2. COVID-19   3. Pregnancy, unspecified  gestational age   22. [redacted] weeks gestation of pregnancy    - IUP confirmed  - Follow up for [place] at [facility] for viability, no FHR today - Recommended establishing pregnancy care ASAP due to chronic hypertension - Sent in RX for nifedipine, patient was not taking amlodipine but BP was elevated her - Reviewed [place] appt date and time  Federico Flake 02/24/2023, 2:48 PM   Allergies as of 02/24/2023   No Known Allergies      Medication List     STOP taking these medications    amLODipine 5 MG tablet Commonly known as: NORVASC   diclofenac Sodium 1 % Gel Commonly known as: Voltaren   norethindrone 0.35 MG tablet Commonly known as: MICRONOR       TAKE these medications    NIFEdipine 30 MG 24 hr tablet Commonly known as: PROCARDIA-XL/NIFEDICAL-XL Take 1 tablet (30 mg total) by mouth daily. Can increase to twice a day as needed for symptomatic  contractions

## 2023-02-24 NOTE — MAU Note (Signed)
.  Amanda Ellison is a 32 y.o. at Unknown here in MAU reporting: Transfer from the ED - came in for feeling unwell - generalized body aches, fever, and fatigue. She did not know she was pregnant until the ED tested. She felt some abdominal pain last night, but does not feel any today. Denies VB or abnormal discharge.  LMP: She thinks August 30th, but unsure Onset of complaint: Last night Pain score: mild Vitals:   02/24/23 1330 02/24/23 1335  BP: 124/82   Pulse: 72   Resp: 15   Temp:  98 F (36.7 C)  SpO2: 98%      FHT:N/A Lab orders placed from triage:  UA

## 2023-02-24 NOTE — ED Notes (Signed)
Transferred to MAU

## 2023-02-24 NOTE — ED Provider Notes (Signed)
[place] EMERGENCY DEPARTMENT AT [facility] Provider Note   CSN: [id number] Arrival date & time: 02/24/23  4098     History  Chief Complaint  Patient presents with   Possible Pregnancy   Generalized Body Aches    Amanda Ellison is a 32 y.o. female.  32 year old female presents today for concern of generalized myalgias, as well as lower abdominal pain.  The abdominal discomfort started last night.  She is also concerned she may be pregnant as her last menstrual cycle was August 30.  Did not take a home pregnancy test.  The history is provided by the patient. No language interpreter was used.       Home Medications Prior to Admission medications   Medication Sig Start Date End Date Taking? Authorizing Provider  amLODipine (NORVASC) 5 MG tablet TAKE 1 TABLET (5 MG TOTAL) BY MOUTH DAILY. 02/14/20 02/13/21  Marcine Matar, MD  diclofenac Sodium (VOLTAREN) 1 % GEL Apply 2 g topically 2 (two) times daily as needed. 12/29/19   Marcine Matar, MD  norethindrone (MICRONOR) 0.35 MG tablet TAKE 1 TABLET (0.35 MG TOTAL) BY MOUTH DAILY. 05/01/20 05/01/21  Hoy Register, MD      Allergies    Patient has no known allergies.    Review of Systems   Review of Systems  Constitutional:  Negative for fever.  Respiratory:  Negative for shortness of breath.   Cardiovascular:  Negative for chest pain.  Gastrointestinal:  Positive for abdominal pain. Negative for nausea and vomiting.  Musculoskeletal:  Positive for myalgias.  All other systems reviewed and are negative.   Physical Exam Updated Vital Signs BP 124/82   Pulse 72   Temp 98 F (36.7 C) (Oral)   Resp 15   LMP 01/02/2023 (Exact Date)   SpO2 98%  Physical Exam Vitals and nursing note reviewed.  Constitutional:      General: She is not in acute distress.    Appearance: Normal appearance. She is not ill-appearing.  HENT:     Head: Normocephalic and atraumatic.     Nose: Nose normal.  Eyes:      Conjunctiva/sclera: Conjunctivae normal.  Cardiovascular:     Rate and Rhythm: Normal rate.  Pulmonary:     Effort: Pulmonary effort is normal. No respiratory distress.  Musculoskeletal:        General: No deformity. Normal range of motion.  Skin:    Findings: No rash.  Neurological:     Mental Status: She is alert.     ED Results / Procedures / Treatments   Labs (all labs ordered are listed, but only abnormal results are displayed) Labs Reviewed  SARS CORONAVIRUS 2 BY RT PCR - Abnormal; Notable for the following components:      Result Value   SARS Coronavirus 2 by RT PCR POSITIVE (*)    All other components within normal limits  HCG, SERUM, QUALITATIVE - Abnormal; Notable for the following components:   Preg, Serum POSITIVE (*)    All other components within normal limits  POC URINE PREG, ED    EKG None  Radiology No results found.  Procedures Procedures    Medications Ordered in ED Medications - No data to display  ED Course/ Medical Decision Making/ A&P                                 Medical Decision Making Amount and/or Complexity of Data  Reviewed Labs: ordered.   32 year old female presents today for concern of generalized myalgias as well as lower abdominal pain.  Concerned about missed menstrual cycle last month.  Last menstrual cycle was 8/30.  Positive pregnancy test.  COVID-positive.  Hemodynamically stable.  Given abdominal pain, she will need ectopic ruled out.  She is agreeable for further workup.  Discussed with MAU provider who accepts patient for ectopic rule out.  From a respiratory/COVID standpoint she is stable.  No acute distress.  Satting 98% on room air.  Without tachycardia or tachypnea.  Final Clinical Impression(s) / ED Diagnoses Final diagnoses:  COVID-19  Pregnancy, unspecified gestational age    Rx / DC Orders ED Discharge Orders     None         Marita Kansas, PA-C 02/24/23 1349    Gerhard Munch, MD 02/25/23  [phone]

## 2023-02-24 NOTE — ED Notes (Signed)
Pt reports abdominal pain since last night but denies pain at this time. Reports "really bad" abdominal pain last night. Will continue to monitor.

## 2023-02-25 LAB — GC/CHLAMYDIA PROBE AMP (~~LOC~~) NOT AT ARMC
Chlamydia: NEGATIVE
Comment: NEGATIVE
Comment: NORMAL
Neisseria Gonorrhea: NEGATIVE

## 2023-02-25 LAB — RPR: RPR Ser Ql: NONREACTIVE

## 2023-03-11 ENCOUNTER — Ambulatory Visit: Payer: 59

## 2023-03-11 DIAGNOSIS — Z3A01 Less than 8 weeks gestation of pregnancy: Secondary | ICD-10-CM

## 2023-03-11 DIAGNOSIS — Z3491 Encounter for supervision of normal pregnancy, unspecified, first trimester: Secondary | ICD-10-CM | POA: Diagnosis not present

## 2023-03-11 DIAGNOSIS — O3680X0 Pregnancy with inconclusive fetal viability, not applicable or unspecified: Secondary | ICD-10-CM

## 2023-03-13 ENCOUNTER — Inpatient Hospital Stay (HOSPITAL_COMMUNITY)
Admission: AD | Admit: 2023-03-13 | Discharge: 2023-03-13 | Disposition: A | Discharge status: Home / Self Care | Payer: Medicaid Other | Attending: Obstetrics & Gynecology | Admitting: Obstetrics & Gynecology

## 2023-03-13 ENCOUNTER — Encounter (HOSPITAL_COMMUNITY): Payer: Self-pay | Admitting: *Deleted

## 2023-03-13 ENCOUNTER — Other Ambulatory Visit: Payer: Self-pay

## 2023-03-13 DIAGNOSIS — R109 Unspecified abdominal pain: Secondary | ICD-10-CM | POA: Diagnosis not present

## 2023-03-13 DIAGNOSIS — Z3A10 10 weeks gestation of pregnancy: Secondary | ICD-10-CM | POA: Insufficient documentation

## 2023-03-13 DIAGNOSIS — O219 Vomiting of pregnancy, unspecified: Secondary | ICD-10-CM | POA: Diagnosis not present

## 2023-03-13 DIAGNOSIS — O26891 Other specified pregnancy related conditions, first trimester: Secondary | ICD-10-CM | POA: Insufficient documentation

## 2023-03-13 DIAGNOSIS — O0931 Supervision of pregnancy with insufficient antenatal care, first trimester: Secondary | ICD-10-CM | POA: Diagnosis not present

## 2023-03-13 DIAGNOSIS — O10919 Unspecified pre-existing hypertension complicating pregnancy, unspecified trimester: Secondary | ICD-10-CM | POA: Diagnosis present

## 2023-03-13 LAB — URINALYSIS, ROUTINE W REFLEX MICROSCOPIC
Bilirubin Urine: NEGATIVE
Glucose, UA: NEGATIVE mg/dL
Hgb urine dipstick: NEGATIVE
Ketones, ur: 20 mg/dL — AB
Nitrite: NEGATIVE
Protein, ur: NEGATIVE mg/dL
Specific Gravity, Urine: 1.018 (ref 1.005–1.030)
pH: 8 (ref 5.0–8.0)

## 2023-03-13 MED ORDER — ONDANSETRON 4 MG PO TBDP
4.0000 mg | ORAL_TABLET | Freq: Once | ORAL | Status: AC
  Administered 2023-03-13: 4 mg via ORAL
  Filled 2023-03-13: qty 1

## 2023-03-13 MED ORDER — ONDANSETRON HCL 4 MG PO TABS
4.0000 mg | ORAL_TABLET | Freq: Three times a day (TID) | ORAL | 1 refills | Status: DC | PRN
  Filled 2023-03-13: qty 20, 7d supply, fill #0

## 2023-03-13 NOTE — MAU Provider Note (Signed)
History     CSN: [id number]  Arrival date and time: 03/13/23 1128   Event Date/Time   First Provider Initiated Contact with Patient 03/13/23 1517      Chief Complaint  Patient presents with   Abdominal Pain   Emesis   HPI  Amanda Ellison is a 32 y.o. female G3P2002 @ [redacted]w[redacted]d here in MAU with complaints of nausea and vomiting. She repots feeling cold and not being able to eat like she should. She has no nausea medications and has not started prenatal care. She is not sure who will see her.   OB History     Gravida  3   Para  2   Term  2   Preterm      AB      Living  2      SAB      IAB      Ectopic      Multiple  0   Live Births  2           Past Medical History:  Diagnosis Date   Cholelithiasis affecting pregnancy, antepartum    Gestational diabetes    Hypertension     History reviewed. No pertinent surgical history.  Family History  Problem Relation Age of Onset   Other Mother        sick one week and died, unknown cause   Other Father        died from car accident   Alcohol abuse Neg Hx    Arthritis Neg Hx    Asthma Neg Hx    Diabetes Neg Hx    Drug abuse Neg Hx    Early death Neg Hx    Birth defects Neg Hx    Cancer Neg Hx    COPD Neg Hx    Depression Neg Hx    Hearing loss Neg Hx    Heart disease Neg Hx    Hyperlipidemia Neg Hx    Hypertension Neg Hx    Kidney disease Neg Hx    Learning disabilities Neg Hx    Mental illness Neg Hx    Mental retardation Neg Hx    Miscarriages / Stillbirths Neg Hx    Stroke Neg Hx    Vision loss Neg Hx    Varicose Veins Neg Hx     Social History   Tobacco Use   Smoking status: Never   Smokeless tobacco: Never  Vaping Use   Vaping status: Never Used  Substance Use Topics   Alcohol use: No   Drug use: No    Allergies: No Known Allergies  No medications prior to admission.   Results for orders placed or performed during the hospital encounter of 03/13/23 (from the past 48  hour(s))  Urinalysis, Routine w reflex microscopic -Urine, Clean Catch     Status: Abnormal   Collection Time: 03/13/23 12:30 PM  Result Value Ref Range   Color, Urine AMBER (A) YELLOW    Comment: BIOCHEMICALS MAY BE AFFECTED BY COLOR   APPearance CLOUDY (A) CLEAR   Specific Gravity, Urine 1.018 1.005 - 1.030   pH 8.0 5.0 - 8.0   Glucose, UA NEGATIVE NEGATIVE mg/dL   Hgb urine dipstick NEGATIVE NEGATIVE   Bilirubin Urine NEGATIVE NEGATIVE   Ketones, ur 20 (A) NEGATIVE mg/dL   Protein, ur NEGATIVE NEGATIVE mg/dL   Nitrite NEGATIVE NEGATIVE   Leukocytes,Ua TRACE (A) NEGATIVE   RBC / HPF 0-5 0 - 5 RBC/hpf   WBC,  UA 6-10 0 - 5 WBC/hpf   Bacteria, UA RARE (A) NONE SEEN   Squamous Epithelial / HPF 21-50 0 - 5 /HPF   Mucus PRESENT    Amorphous Crystal PRESENT     Comment: Performed at [facility], [address] [place], [place] [address]    Review of Systems  Gastrointestinal:  Positive for nausea and vomiting. Negative for abdominal pain.   Physical Exam   Blood pressure 135/80, pulse 61, temperature 98.6 F (37 C), temperature source Oral, resp. rate 18, height 5\' 10"  (1.778 m), weight 102.4 kg, last menstrual period 01/02/2023, SpO2 99%, unknown if currently breastfeeding.  Physical Exam Constitutional:      General: She is not in acute distress.    Appearance: She is well-developed. She is not ill-appearing, toxic-appearing or diaphoretic.  Neurological:     Mental Status: She is alert and oriented to person, place, and time.    MAU Course  Procedures  MDM  Zofran given 4 mg ODT No vomiting noted in MAU Patient asking for referral for OB care.   Assessment and Plan   A:  1. Nausea and vomiting during pregnancy   2. [redacted] weeks gestation of pregnancy      P:  Dc home Return to MAU if symptoms worsen RX: Zofran Start prenatal care, message sent to [facility] for scheduling. Small, frequent, meals   Somer Trotter, Harolyn Rutherford, NP 03/13/2023 6:58 PM

## 2023-03-13 NOTE — MAU Note (Signed)
States nausea is better, asking if she can eat a banana she has.  Has not thrown up since arrived.

## 2023-03-13 NOTE — MAU Note (Addendum)
Amanda Ellison is a 32 y.o. at [redacted]w[redacted]d here in MAU reporting: every day, she eats and throws  up.  She is having pain in her stomach. Pain happens when she throws up.  At night she feels cold. Is not taking any meds for nausea Onset of complaint: ongoing Pain score: 1- pulling pain Vitals:   03/13/23 1205  BP: 122/79  Pulse: 66  Resp: 18  Temp: 98.6 F (37 C)  SpO2: 99%      Lab orders placed from triage:  urine   Had [place] 11/6- has not been notified of results.unable to see report at this time

## 2023-03-18 ENCOUNTER — Inpatient Hospital Stay (HOSPITAL_COMMUNITY)
Admission: AD | Admit: 2023-03-18 | Discharge: 2023-03-18 | Disposition: A | Discharge status: Home / Self Care | Payer: Medicaid Other | Attending: Certified Nurse Midwife | Admitting: Certified Nurse Midwife

## 2023-03-18 DIAGNOSIS — R112 Nausea with vomiting, unspecified: Secondary | ICD-10-CM | POA: Diagnosis present

## 2023-03-18 DIAGNOSIS — N898 Other specified noninflammatory disorders of vagina: Secondary | ICD-10-CM | POA: Diagnosis not present

## 2023-03-18 DIAGNOSIS — K5900 Constipation, unspecified: Secondary | ICD-10-CM | POA: Diagnosis not present

## 2023-03-18 DIAGNOSIS — O219 Vomiting of pregnancy, unspecified: Secondary | ICD-10-CM | POA: Insufficient documentation

## 2023-03-18 DIAGNOSIS — O99611 Diseases of the digestive system complicating pregnancy, first trimester: Secondary | ICD-10-CM | POA: Insufficient documentation

## 2023-03-18 DIAGNOSIS — Z3A10 10 weeks gestation of pregnancy: Secondary | ICD-10-CM | POA: Diagnosis not present

## 2023-03-18 DIAGNOSIS — O26891 Other specified pregnancy related conditions, first trimester: Secondary | ICD-10-CM | POA: Insufficient documentation

## 2023-03-18 DIAGNOSIS — O209 Hemorrhage in early pregnancy, unspecified: Secondary | ICD-10-CM | POA: Diagnosis present

## 2023-03-18 DIAGNOSIS — Z711 Person with feared health complaint in whom no diagnosis is made: Secondary | ICD-10-CM

## 2023-03-18 DIAGNOSIS — R198 Other specified symptoms and signs involving the digestive system and abdomen: Secondary | ICD-10-CM

## 2023-03-18 DIAGNOSIS — Z3A08 8 weeks gestation of pregnancy: Secondary | ICD-10-CM

## 2023-03-18 DIAGNOSIS — R1084 Generalized abdominal pain: Secondary | ICD-10-CM | POA: Diagnosis present

## 2023-03-18 LAB — URINALYSIS, ROUTINE W REFLEX MICROSCOPIC
Bilirubin Urine: NEGATIVE
Glucose, UA: NEGATIVE mg/dL
Hgb urine dipstick: NEGATIVE
Ketones, ur: 20 mg/dL — AB
Nitrite: NEGATIVE
Protein, ur: 30 mg/dL — AB
Specific Gravity, Urine: 1.023 (ref 1.005–1.030)
pH: 8 (ref 5.0–8.0)

## 2023-03-18 LAB — WET PREP, GENITAL
Clue Cells Wet Prep HPF POC: NONE SEEN
Sperm: NONE SEEN
Trich, Wet Prep: NONE SEEN
WBC, Wet Prep HPF POC: <10 (ref <10)
Yeast Wet Prep HPF POC: NONE SEEN

## 2023-03-18 MED ORDER — DOCUSATE SODIUM 100 MG PO CAPS
100.0000 mg | ORAL_CAPSULE | Freq: Once | ORAL | Status: AC
  Administered 2023-03-18: 100 mg via ORAL
  Filled 2023-03-18: qty 1

## 2023-03-18 MED ORDER — ONDANSETRON 4 MG PO TBDP
4.0000 mg | ORAL_TABLET | Freq: Once | ORAL | Status: AC
  Administered 2023-03-18: 4 mg via ORAL
  Filled 2023-03-18: qty 1

## 2023-03-18 MED ORDER — ONDANSETRON HCL 4 MG/2ML IJ SOLN: 4.0000 mg | Freq: Once | INTRAMUSCULAR | Status: DC

## 2023-03-18 MED ORDER — ACETAMINOPHEN 500 MG PO TABS
1000.0000 mg | ORAL_TABLET | Freq: Once | ORAL | Status: DC
  Filled 2023-03-18: qty 2

## 2023-03-18 MED ORDER — SIMETHICONE 80 MG PO CHEW
80.0000 mg | CHEWABLE_TABLET | Freq: Once | ORAL | Status: AC
  Administered 2023-03-18: 80 mg via ORAL
  Filled 2023-03-18: qty 1

## 2023-03-18 NOTE — MAU Provider Note (Signed)
History   Amanda Ellison , a  32 y.o. G3P2002 at [redacted]w[redacted]d by ultrasound, presents to MAU with c/o abdominal tightening and vaginal discharge that started last night around 2100. She reports that mid abdominal cramping/tightening that occurs q2-3 mins & is ongoing since last night. She stated around this time she also noticed a small amount of brown mucus discharge that has since disappeared. She denies VB or any other abnormal vaginal discharge. When the pain occurs she reports it as a 9/10 and has not taken anything for the pain, but reports she does a lot of heavy lifting at work and is requesting a work restriction note. She endorses daily N/V and a decreased appetite. She has not had a BM for 2 days & reports her BM's as daily regularly. Patient endorses adequate fluid intake & denies any recent sick contacts, although was recently diagnosed with COVID on 10/22.    CSN: [id number]  Arrival date and time: 03/18/23 0747   Event Date/Time   First Provider Initiated Contact with Patient 03/18/23 [phone]      Chief Complaint  Patient presents with   Abdominal Pain   Abdominal Pain Associated symptoms include nausea and vomiting.    OB History     Gravida  3   Para  2   Term  2   Preterm      AB      Living  2      SAB      IAB      Ectopic      Multiple  0   Live Births  2           Past Medical History:  Diagnosis Date   Cholelithiasis affecting pregnancy, antepartum    Gestational diabetes    Hypertension     No past surgical history on file.  Family History  Problem Relation Age of Onset   Other Mother        sick one week and died, unknown cause   Other Father        died from car accident   Alcohol abuse Neg Hx    Arthritis Neg Hx    Asthma Neg Hx    Diabetes Neg Hx    Drug abuse Neg Hx    Early death Neg Hx    Birth defects Neg Hx    Cancer Neg Hx    COPD Neg Hx    Depression Neg Hx    Hearing loss Neg Hx    Heart disease Neg Hx     Hyperlipidemia Neg Hx    Hypertension Neg Hx    Kidney disease Neg Hx    Learning disabilities Neg Hx    Mental illness Neg Hx    Mental retardation Neg Hx    Miscarriages / Stillbirths Neg Hx    Stroke Neg Hx    Vision loss Neg Hx    Varicose Veins Neg Hx     Social History   Tobacco Use   Smoking status: Never   Smokeless tobacco: Never  Vaping Use   Vaping status: Never Used  Substance Use Topics   Alcohol use: No   Drug use: No    Allergies: No Known Allergies  Medications Prior to Admission  Medication Sig Dispense Refill Last Dose   NIFEdipine (PROCARDIA-XL/NIFEDICAL-XL) 30 MG 24 hr tablet Take 1 tablet (30 mg total) by mouth daily. Can increase to twice a day as needed for symptomatic contractions 30 tablet 2  ondansetron (ZOFRAN) 4 MG tablet Take 1 tablet (4 mg total) by mouth every 8 (eight) hours as needed for nausea or vomiting. 20 tablet 1     Review of Systems  Gastrointestinal:  Positive for abdominal pain, nausea and vomiting.  Genitourinary:  Positive for vaginal discharge. Negative for vaginal bleeding.   Physical Exam   Blood pressure 102/67, pulse 72, temperature 98.4 F (36.9 C), temperature source Oral, resp. rate 16, last menstrual period 01/02/2023, SpO2 100%, unknown if currently breastfeeding.  Physical Exam Constitutional:      General: She is not in acute distress.    Appearance: She is not ill-appearing.  Abdominal:     Palpations: Abdomen is soft.     Tenderness: There is no abdominal tenderness.  Skin:    General: Skin is warm and dry.  Neurological:     Mental Status: She is oriented to person, place, and time.  Psychiatric:        Mood and Affect: Mood normal.        Behavior: Behavior normal.     MAU Course  Procedures Orders Placed This Encounter  Procedures   Wet prep, genital   Urinalysis, Routine w reflex microscopic -Urine, Clean Catch    Results for orders placed or performed during the hospital encounter of  03/18/23 (from the past 24 hour(s))  Urinalysis, Routine w reflex microscopic -Urine, Clean Catch     Status: Abnormal   Collection Time: 03/18/23  8:11 AM  Result Value Ref Range   Color, Urine YELLOW YELLOW   APPearance CLOUDY (A) CLEAR   Specific Gravity, Urine 1.023 1.005 - 1.030   pH 8.0 5.0 - 8.0   Glucose, UA NEGATIVE NEGATIVE mg/dL   Hgb urine dipstick NEGATIVE NEGATIVE   Bilirubin Urine NEGATIVE NEGATIVE   Ketones, ur 20 (A) NEGATIVE mg/dL   Protein, ur 30 (A) NEGATIVE mg/dL   Nitrite NEGATIVE NEGATIVE   Leukocytes,Ua MODERATE (A) NEGATIVE   RBC / HPF 0-5 0 - 5 RBC/hpf   WBC, UA 11-20 0 - 5 WBC/hpf   Bacteria, UA FEW (A) NONE SEEN   Squamous Epithelial / HPF 21-50 0 - 5 /HPF   Mucus PRESENT   Wet prep, genital     Status: None   Collection Time: 03/18/23  9:21 AM   Specimen: Vaginal  Result Value Ref Range   Yeast Wet Prep HPF POC NONE SEEN NONE SEEN   Trich, Wet Prep NONE SEEN NONE SEEN   Clue Cells Wet Prep HPF POC NONE SEEN NONE SEEN   WBC, Wet Prep HPF POC <10 <10   Sperm NONE SEEN     MDM Abdominal pain in pregnancy -UA negative -reports daily N/V, ODT Zofran given -low suspicion for UTI/unknown infection causing abdominal pain Vaginal discharge in pregnancy -Wetprep negative -low suspicion for vaginal infection causing discharge Constipation in pregnancy -Discomfort and pain relieved with Simethicone -Colace given -Suspicion for abdominal pain r/t constipation/gas related sx's   Discharge home  Assessment and Plan  Abdominal pain in pregnancy Constipation Expectant management reviewed Educated patient on normal s/s of pregnancy Recommended a stool softener PRN for relief 3. Vaginal Discharge in pregnancy Educated patient on normal vaginal discharge and vaginal spotting early in pregnancy Return precautions given and reviewed Encourage patient to keep scheduled OB appt  Discharge home in stable condition  Darrell Jewel, SNM 03/18/2023, 9:49  AM

## 2023-03-18 NOTE — MAU Note (Signed)
.  Amanda Ellison is a 32 y.o. at [redacted]w[redacted]d here in MAU reporting: she began having intermittent abdominal tightening last night that has continued into this morning (9/10). She denies VB, but does report a small amount of brown mucous-like  discharge.   Onset of complaint: last night Pain score: 9/10 Vitals:   03/18/23 0801  BP: 102/67  Pulse: 72  Resp: 16  Temp: 98.4 F (36.9 C)  SpO2: 100%     FHT:159 Lab orders placed from triage:  UA

## 2023-03-23 ENCOUNTER — Other Ambulatory Visit: Payer: Self-pay

## 2023-03-23 ENCOUNTER — Ambulatory Visit (INDEPENDENT_AMBULATORY_CARE_PROVIDER_SITE_OTHER): Payer: Medicaid Other

## 2023-03-23 VITALS — BP 138/90 | HR 76 | Wt 223.1 lb

## 2023-03-23 DIAGNOSIS — O10919 Unspecified pre-existing hypertension complicating pregnancy, unspecified trimester: Secondary | ICD-10-CM | POA: Diagnosis not present

## 2023-03-23 DIAGNOSIS — Z3A09 9 weeks gestation of pregnancy: Secondary | ICD-10-CM | POA: Diagnosis not present

## 2023-03-23 DIAGNOSIS — O0990 Supervision of high risk pregnancy, unspecified, unspecified trimester: Secondary | ICD-10-CM | POA: Insufficient documentation

## 2023-03-23 MED ORDER — PRENATAL PLUS VITAMIN/MINERAL 27-1 MG PO TABS
1.0000 | ORAL_TABLET | Freq: Every day | ORAL | 11 refills | Status: DC
  Filled 2023-03-27: qty 30, 30d supply, fill #0

## 2023-03-23 MED ORDER — BLOOD PRESSURE KIT DEVI: 1.0000 | Freq: Once | 0 refills | Status: AC

## 2023-03-23 NOTE — Progress Notes (Signed)
New OB Intake  Payslie Meckel here in person for New OB Intake today. We discussed EDD of 10/25/2023, by ultrasound. Pt is G3P2002. I reviewed her allergies, medications and Medical/Surgical/OB history. FHR deferred today because patient is [redacted]w[redacted]d and viability has been confirmed by [place].  Patient Active Problem List   Diagnosis Date Noted   Generalized abdominal pain affecting pregnancy in first trimester 03/18/2023   Nausea and vomiting in pregnancy prior to [redacted] weeks gestation 03/18/2023   Chronic hypertension affecting pregnancy 03/13/2023   Prediabetes 02/14/2020   Class 1 obesity due to excess calories with serious comorbidity in adult 02/14/2020   Cholestasis during pregnancy in third trimester 10/26/2019   Gestational diabetes mellitus (GDM) 09/22/2019   Anemia 04/24/2016   Rh negative, antepartum 10/22/2014   Concerns addressed today Continues to have generalized upper abdominal pain. Patient does not believe this is related to constipation as previously discussed. Denies any vaginal bleeding or lower abdominal/pelvic pain. Encouraged pt to review at new OB with provider.  Delivery Plans Plans to deliver at [facility]. Discussed the nature of our practice with multiple providers including residents and students. Due to the size of the practice, the delivering provider may not be the same as those providing prenatal care.   MyChart/Babyscripts MyChart registration link sent. Babyscripts instructions given and order placed.   Blood Pressure Cuff Blood pressure cuff ordered for patient to pick-up from [employer]. Explained after first prenatal appt pt will check weekly and document in Babyscripts.  Anatomy US Explained first scheduled [place] will be around 19 weeks. Anatomy US scheduled for 06/01/23. Pt will view in MyChart.  Is patient a CenteringPregnancy candidate?  Accepted   Is patient a Mom+Baby Combined Care candidate?  Not a candidate    Is patient a candidate for  Babyscripts Optimization? No- chronic hypertension, desires Centering  First visit review I reviewed new OB appt with patient. Explained pt will be seen by K. Alvester Morin, MD at first visit. Discussed Avelina Laine genetic screening with patient. Pt desires [facility] and [facility]. Offered to draw routine labs today and genetic screening at new OB due to early gestational age. Pt would prefer to have all labs drawn at one time. Pap smear will need to be updated.  Last Pap Diagnosis  Date Value Ref Range Status  10/24/2016   Final   NEGATIVE FOR INTRAEPITHELIAL LESIONS OR MALIGNANCY.   Marjo Bicker, RN 03/23/2023  2:09 PM

## 2023-03-24 ENCOUNTER — Encounter: Payer: Self-pay | Admitting: Family Medicine

## 2023-03-25 ENCOUNTER — Emergency Department (HOSPITAL_COMMUNITY): Payer: Medicaid Other

## 2023-03-25 ENCOUNTER — Emergency Department (HOSPITAL_COMMUNITY)
Admission: EM | Admit: 2023-03-25 | Discharge: 2023-03-25 | Disposition: A | Discharge status: Home / Self Care | Payer: Medicaid Other | Attending: Emergency Medicine | Admitting: Emergency Medicine

## 2023-03-25 ENCOUNTER — Other Ambulatory Visit: Payer: Self-pay

## 2023-03-25 ENCOUNTER — Encounter (HOSPITAL_COMMUNITY): Payer: Self-pay

## 2023-03-25 DIAGNOSIS — M25422 Effusion, left elbow: Secondary | ICD-10-CM | POA: Diagnosis not present

## 2023-03-25 DIAGNOSIS — Y92410 Unspecified street and highway as the place of occurrence of the external cause: Secondary | ICD-10-CM | POA: Diagnosis not present

## 2023-03-25 DIAGNOSIS — S0081XA Abrasion of other part of head, initial encounter: Secondary | ICD-10-CM | POA: Diagnosis not present

## 2023-03-25 DIAGNOSIS — O9A211 Injury, poisoning and certain other consequences of external causes complicating pregnancy, first trimester: Secondary | ICD-10-CM | POA: Diagnosis present

## 2023-03-25 LAB — BASIC METABOLIC PANEL
Anion gap: 11 (ref 5–15)
BUN: 6 mg/dL (ref 6–20)
CO2: 18 mmol/L — ABNORMAL LOW (ref 22–32)
Calcium: 9.7 mg/dL (ref 8.9–10.3)
Chloride: 103 mmol/L (ref 98–111)
Creatinine, Ser: 0.61 mg/dL (ref 0.44–1.00)
GFR, Estimated: >60 mL/min (ref >60)
Glucose, Bld: 97 mg/dL (ref 70–99)
Potassium: 4.1 mmol/L (ref 3.5–5.1)
Sodium: 132 mmol/L — ABNORMAL LOW (ref 135–145)

## 2023-03-25 LAB — CBC
HCT: 41.6 % (ref 36.0–46.0)
Hemoglobin: 13.2 g/dL (ref 12.0–15.0)
MCH: 25.8 pg — ABNORMAL LOW (ref 26.0–34.0)
MCHC: 31.7 g/dL (ref 30.0–36.0)
MCV: 81.4 fL (ref 80.0–100.0)
Platelets: 346 10*3/uL (ref 150–400)
RBC: 5.11 MIL/uL (ref 3.87–5.11)
RDW: 12.8 % (ref 11.5–15.5)
WBC: 6.4 10*3/uL (ref 4.0–10.5)
nRBC: 0 % (ref 0.0–0.2)

## 2023-03-25 LAB — TROPONIN I (HIGH SENSITIVITY): Troponin I (High Sensitivity): 10 ng/L (ref <18)

## 2023-03-25 MED ORDER — ACETAMINOPHEN 500 MG PO TABS
1000.0000 mg | ORAL_TABLET | Freq: Once | ORAL | Status: AC
  Administered 2023-03-25: 1000 mg via ORAL
  Filled 2023-03-25: qty 2

## 2023-03-25 NOTE — ED Notes (Signed)
Unable to obtain fetal heart rate via external doppler due to patient only being 9 weeks 3 days.

## 2023-03-25 NOTE — ED Notes (Signed)
Verbal order from Darby PA that we do not need 2nd trop

## 2023-03-25 NOTE — ED Notes (Signed)
Patient ambulated to bathroom without assistance and with a steady gait.

## 2023-03-25 NOTE — Discharge Instructions (Signed)
Evaluation today was overall reassuring.  Recommend you follow-up with your OB/GYN provider.  If you start experience vaginal bleeding or abnormal vaginal discharge, develop worsening abdominal pain, develop a fever, have chest pain or shortness of breath or any other concerning symptom please return emerged part further evaluation.
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
## 2023-03-27 ENCOUNTER — Other Ambulatory Visit: Payer: Self-pay

## 2023-03-30 ENCOUNTER — Encounter: Payer: Self-pay | Admitting: Certified Nurse Midwife

## 2023-03-30 ENCOUNTER — Ambulatory Visit (INDEPENDENT_AMBULATORY_CARE_PROVIDER_SITE_OTHER): Payer: Medicaid Other | Admitting: Certified Nurse Midwife

## 2023-03-30 ENCOUNTER — Other Ambulatory Visit (HOSPITAL_COMMUNITY)
Admission: RE | Admit: 2023-03-30 | Discharge: 2023-03-30 | Disposition: A | Discharge status: Home / Self Care | Payer: Medicaid Other | Source: Ambulatory Visit | Attending: Family Medicine | Admitting: Family Medicine

## 2023-03-30 VITALS — BP 137/88 | HR 86 | Wt 227.1 lb

## 2023-03-30 DIAGNOSIS — O0991 Supervision of high risk pregnancy, unspecified, first trimester: Secondary | ICD-10-CM

## 2023-03-30 DIAGNOSIS — O99810 Abnormal glucose complicating pregnancy: Secondary | ICD-10-CM | POA: Diagnosis not present

## 2023-03-30 DIAGNOSIS — Z3A10 10 weeks gestation of pregnancy: Secondary | ICD-10-CM | POA: Diagnosis not present

## 2023-03-30 DIAGNOSIS — O10911 Unspecified pre-existing hypertension complicating pregnancy, first trimester: Secondary | ICD-10-CM | POA: Diagnosis not present

## 2023-03-30 DIAGNOSIS — O10919 Unspecified pre-existing hypertension complicating pregnancy, unspecified trimester: Secondary | ICD-10-CM

## 2023-03-30 DIAGNOSIS — Z3481 Encounter for supervision of other normal pregnancy, first trimester: Secondary | ICD-10-CM | POA: Insufficient documentation

## 2023-03-30 MED ORDER — PREPLUS 27-1 MG PO TABS: 1.0000 | ORAL_TABLET | Freq: Every day | ORAL | 13 refills | Status: DC

## 2023-03-30 MED ORDER — DOCUSATE SODIUM 100 MG PO CAPS: 100.0000 mg | ORAL_CAPSULE | Freq: Every day | ORAL | 9 refills | Status: AC | PRN

## 2023-03-30 MED ORDER — ASPIRIN 81 MG PO TBEC: 162.0000 mg | DELAYED_RELEASE_TABLET | Freq: Every day | ORAL | 2 refills | Status: DC

## 2023-03-30 NOTE — Progress Notes (Addendum)
History:   Amanda Ellison is a 32 y.o. G3P2002 at [redacted]w[redacted]d by LMP being seen today for her first obstetrical visit.  Her obstetrical history is significant for obesity and chronic hypertension . Patient does intend to breast feed. Pregnancy history fully reviewed.  Patient reports no complaints.      HISTORY: OB History  Gravida Para Term Preterm AB Living  3 2 2  0 0 2  SAB IAB Ectopic Multiple Live Births  0 0 0 0 2    # Outcome Date GA Lbr Len/2nd Weight Sex Type Anes PTL Lv  3 Current           2 Term 10/26/19 [redacted]w[redacted]d / 00:33 6 lb 13.4 oz (3.1 kg) F Vag-Spont EPI  LIV     Name: Duffell,GIRL Elayne     Apgar1: 8  Apgar5: 9  1 Term 02/09/15 [redacted]w[redacted]d 02:21 / 00:51 7 lb 8.8 oz (3.425 kg) F Vag-Spont EPI  LIV     Name: Morrone,GIRL Halleigh     Apgar1: 8  Apgar5: 9    Last pap smear unknown and was  unknown.  Past Medical History:  Diagnosis Date   Anemia 04/24/2016   Cholelithiasis affecting pregnancy, antepartum    Gestational diabetes    Hypertension    No past surgical history on file. Family History  Problem Relation Age of Onset   Other Mother        sick one week and died, unknown cause   Other Father        died from car accident   Alcohol abuse Neg Hx    Arthritis Neg Hx    Asthma Neg Hx    Diabetes Neg Hx    Drug abuse Neg Hx    Early death Neg Hx    Birth defects Neg Hx    Cancer Neg Hx    COPD Neg Hx    Depression Neg Hx    Hearing loss Neg Hx    Heart disease Neg Hx    Hyperlipidemia Neg Hx    Hypertension Neg Hx    Kidney disease Neg Hx    Learning disabilities Neg Hx    Mental illness Neg Hx    Mental retardation Neg Hx    Miscarriages / Stillbirths Neg Hx    Stroke Neg Hx    Vision loss Neg Hx    Varicose Veins Neg Hx    Social History   Tobacco Use   Smoking status: Never   Smokeless tobacco: Never  Vaping Use   Vaping status: Never Used  Substance Use Topics   Alcohol use: No   Drug use: No   No Known Allergies Current  Outpatient Medications on File Prior to Visit  Medication Sig Dispense Refill   NIFEdipine (PROCARDIA-XL/NIFEDICAL-XL) 30 MG 24 hr tablet Take 1 tablet (30 mg total) by mouth daily. Can increase to twice a day as needed for symptomatic contractions (Patient not taking: Reported on 03/25/2023) 30 tablet 2   ondansetron (ZOFRAN) 4 MG tablet Take 1 tablet (4 mg total) by mouth every 8 (eight) hours as needed for nausea or vomiting. (Patient not taking: Reported on 03/25/2023) 20 tablet 1   Prenatal Vit-Fe Fumarate-FA (PRENATAL PLUS VITAMIN/MINERAL) 27-1 MG TABS Take 1 tablet by mouth daily. (Patient not taking: Reported on 03/25/2023) 30 tablet 11   No current facility-administered medications on file prior to visit.    Review of Systems Pertinent items noted in HPI and remainder of comprehensive ROS otherwise  negative. Physical Exam:  There were no vitals filed for this visit.    Constitutional: Well-developed, well-nourished pregnant female in no acute distress.  HEENT: PERRLA Skin: normal color and turgor, no rash Cardiovascular: normal rate & rhythm, warm and well perfused Respiratory: normal effort, no problems with respiration noted GI: Abd soft, non-distended MS: Extremities nontender, no edema, normal ROM Neurologic: Alert and oriented x 4.  GU: no CVA tenderness Pelvic: NEFG, physiologic discharge, no blood, cervix clean. Pap/swabs collected attempted collection, difficulty in visualizing cervical os.  Assessment:    Pregnancy: [id number] Patient Active Problem List   Diagnosis Date Noted   Supervision of high risk pregnancy, antepartum 03/23/2023   Chronic hypertension affecting pregnancy 03/13/2023   Prediabetes 02/14/2020   Class 1 obesity due to excess calories with serious comorbidity in adult 02/14/2020   History of cholestasis during pregnancy 10/26/2019   History of gestational diabetes 09/22/2019   Rh negative, antepartum 10/22/2014     Plan:    1. Encounter for  supervision of other normal pregnancy in first trimester - Feeling well. Reports that fatigue has improved.   2. [redacted] weeks gestation of pregnancy - Routine prenatal care.  - Accepted to Centering for upcoming care.  3. Chronic hypertension affecting pregnancy -  ASA ordered.   4. Prediabetes in pregnancy - Previous GDM, current hA1C consistent with prediabetes. - CBG monitoring supplies ordered. Diabetes educator orders in. - Message sent to office staff to schedule patient for CBG log review in one week.  - Reached out to patient via phone 04/03/23. Patient verbalized understanding.   - Initial labs drawn. - Continue prenatal vitamins. - Problem list reviewed and updated. - Genetic Screening discussed, First trimester screen, Quad screen, and NIPS: ordered. - Ultrasound discussed; fetal anatomic survey: ordered. - Anticipatory guidance about prenatal visits given including labs, ultrasounds, and testing. - Discussed usage of Babyscripts and virtual visits as additional source of managing and completing prenatal visits in midst of coronavirus and pandemic.   - Encouraged to complete MyChart Registration for her ability to review results, send requests, and have questions addressed.  - The nature of [place] [facility]Faculty Practice with multiple MDs and Advanced Practice Providers was explained to patient; also emphasized that residents, students are part of our team. - Routine obstetric precautions reviewed. Encouraged to seek out care at office or emergency room [facility] MAU preferred) for urgent and/or emergent concerns.  No follow-ups on file.    Future Appointments  Date Time Provider Department Center  03/30/2023  2:55 PM [facility]  05/21/2023  9:00 AM CENTERING PROVIDER [facility]  06/01/2023  2:15 PM [facility] NURSE [facility]  06/01/2023  2:30 PM [facility] US2 [facility]  06/18/2023  9:00 AM CENTERING PROVIDER [facility]   07/16/2023  9:00 AM CENTERING PROVIDER [facility]  07/30/2023  9:00 AM CENTERING PROVIDER [facility]  08/13/2023  9:00 AM CENTERING PROVIDER [facility]  08/27/2023  9:00 AM CENTERING PROVIDER [facility]  09/10/2023  9:00 AM CENTERING PROVIDER [facility]  09/24/2023  9:00 AM CENTERING PROVIDER [facility]  10/08/2023  9:00 AM CENTERING PROVIDER [facility]  10/22/2023  9:00 AM CENTERING PROVIDER [facility]    Lamont Snowball, MSN, CNM, RNC-OB Certified Nurse Midwife, [facility] 03/30/2023 10:24 AM

## 2023-03-30 NOTE — Progress Notes (Signed)
Informal bedside ultrasound performed to assess FHR, FHR 167bpm.

## 2023-03-31 LAB — CBC/D/PLT+RPR+RH+ABO+RUBIGG...
Antibody Screen: NEGATIVE
Basophils Absolute: 0 10*3/uL (ref 0.0–0.2)
Basos: 0 %
EOS (ABSOLUTE): 0 10*3/uL (ref 0.0–0.4)
Eos: 0 %
HCV Ab: NONREACTIVE
HIV Screen 4th Generation wRfx: NONREACTIVE
Hematocrit: 36.7 % (ref 34.0–46.6)
Hemoglobin: 11.8 g/dL (ref 11.1–15.9)
Hepatitis B Surface Ag: NEGATIVE
Immature Grans (Abs): 0 10*3/uL (ref 0.0–0.1)
Immature Granulocytes: 0 %
Lymphocytes Absolute: 1.6 10*3/uL (ref 0.7–3.1)
Lymphs: 23 %
MCH: 27 pg (ref 26.6–33.0)
MCHC: 32.2 g/dL (ref 31.5–35.7)
MCV: 84 fL (ref 79–97)
Monocytes Absolute: 0.7 10*3/uL (ref 0.1–0.9)
Monocytes: 10 %
Neutrophils Absolute: 4.7 10*3/uL (ref 1.4–7.0)
Neutrophils: 67 %
Platelets: 373 10*3/uL (ref 150–450)
RBC: 4.37 x10E6/uL (ref 3.77–5.28)
RDW: 13 % (ref 11.7–15.4)
RPR Ser Ql: NONREACTIVE
Rh Factor: NEGATIVE
Rubella Antibodies, IGG: 20.5 {index} (ref >0.99)
WBC: 7.1 10*3/uL (ref 3.4–10.8)

## 2023-03-31 LAB — COMPREHENSIVE METABOLIC PANEL
ALT: 37 [IU]/L — ABNORMAL HIGH (ref 0–32)
AST: 20 [IU]/L (ref 0–40)
Albumin: 3.6 g/dL — ABNORMAL LOW (ref 3.9–4.9)
Alkaline Phosphatase: 56 [IU]/L (ref 44–121)
BUN/Creatinine Ratio: 14 (ref 9–23)
BUN: 7 mg/dL (ref 6–20)
Bilirubin Total: <0.2 mg/dL (ref 0.0–1.2)
CO2: 19 mmol/L — ABNORMAL LOW (ref 20–29)
Calcium: 9.4 mg/dL (ref 8.7–10.2)
Chloride: 103 mmol/L (ref 96–106)
Creatinine, Ser: 0.51 mg/dL — ABNORMAL LOW (ref 0.57–1.00)
Globulin, Total: 2.4 g/dL (ref 1.5–4.5)
Glucose: 84 mg/dL (ref 70–99)
Potassium: 4.1 mmol/L (ref 3.5–5.2)
Sodium: 135 mmol/L (ref 134–144)
Total Protein: 6 g/dL (ref 6.0–8.5)
eGFR: 126 mL/min/{1.73_m2} (ref >59)

## 2023-03-31 LAB — PROTEIN / CREATININE RATIO, URINE
Creatinine, Urine: 27.3 mg/dL
Protein, Ur: 5.9 mg/dL
Protein/Creat Ratio: 216 mg/g{creat} — ABNORMAL HIGH (ref 0–200)

## 2023-03-31 LAB — HCV INTERPRETATION

## 2023-03-31 LAB — HEMOGLOBIN A1C
Est. average glucose Bld gHb Est-mCnc: 123 mg/dL
Hgb A1c MFr Bld: 5.9 % — ABNORMAL HIGH (ref 4.8–5.6)

## 2023-04-01 LAB — CYTOLOGY - PAP
Chlamydia: NEGATIVE
Comment: NEGATIVE
Comment: NEGATIVE
Comment: NORMAL
Diagnosis: NEGATIVE
High risk HPV: NEGATIVE
Neisseria Gonorrhea: NEGATIVE

## 2023-04-01 LAB — URINE CULTURE, OB REFLEX

## 2023-04-01 LAB — CULTURE, OB URINE

## 2023-04-03 ENCOUNTER — Other Ambulatory Visit: Payer: Self-pay

## 2023-04-03 MED ORDER — ACCU-CHEK SOFTCLIX LANCETS MISC: 1.0000 | Freq: Four times a day (QID) | 12 refills | Status: DC

## 2023-04-03 MED ORDER — ACCU-CHEK GUIDE W/DEVICE KIT
1.0000 | PACK | Freq: Four times a day (QID) | 0 refills | Status: DC
Start: 2023-04-03 — End: 2023-10-07

## 2023-04-03 MED ORDER — BLOOD GLUCOSE TEST STRIPS 333 VI STRP
1.0000 | ORAL_STRIP | Freq: Four times a day (QID) | 12 refills | Status: DC
Start: 2023-04-03 — End: 2023-10-07

## 2023-04-03 NOTE — Addendum Note (Signed)
Addended by: Lamont Snowball A on: 04/03/2023 06:21 PM   Modules accepted: Orders

## 2023-04-05 ENCOUNTER — Inpatient Hospital Stay (HOSPITAL_COMMUNITY)
Admission: AD | Admit: 2023-04-05 | Discharge: 2023-04-05 | Disposition: A | Discharge status: Home / Self Care | Payer: Medicaid Other | Attending: Obstetrics & Gynecology | Admitting: Obstetrics & Gynecology

## 2023-04-05 ENCOUNTER — Other Ambulatory Visit: Payer: Self-pay

## 2023-04-05 ENCOUNTER — Inpatient Hospital Stay (HOSPITAL_COMMUNITY): Payer: Medicaid Other

## 2023-04-05 DIAGNOSIS — O208 Other hemorrhage in early pregnancy: Secondary | ICD-10-CM

## 2023-04-05 DIAGNOSIS — Z23 Encounter for immunization: Secondary | ICD-10-CM | POA: Diagnosis not present

## 2023-04-05 DIAGNOSIS — O26899 Other specified pregnancy related conditions, unspecified trimester: Secondary | ICD-10-CM

## 2023-04-05 DIAGNOSIS — O26891 Other specified pregnancy related conditions, first trimester: Secondary | ICD-10-CM | POA: Insufficient documentation

## 2023-04-05 DIAGNOSIS — R1010 Upper abdominal pain, unspecified: Secondary | ICD-10-CM | POA: Insufficient documentation

## 2023-04-05 DIAGNOSIS — Z3A11 11 weeks gestation of pregnancy: Secondary | ICD-10-CM

## 2023-04-05 LAB — URINALYSIS, ROUTINE W REFLEX MICROSCOPIC
Bacteria, UA: NONE SEEN
Bilirubin Urine: NEGATIVE
Glucose, UA: NEGATIVE mg/dL
Ketones, ur: NEGATIVE mg/dL
Nitrite: NEGATIVE
Protein, ur: NEGATIVE mg/dL
Specific Gravity, Urine: 1.008 (ref 1.005–1.030)
pH: 7 (ref 5.0–8.0)

## 2023-04-05 LAB — CBC
HCT: 38.1 % (ref 36.0–46.0)
Hemoglobin: 12.2 g/dL (ref 12.0–15.0)
MCH: 26.1 pg (ref 26.0–34.0)
MCHC: 32 g/dL (ref 30.0–36.0)
MCV: 81.6 fL (ref 80.0–100.0)
Platelets: 342 10*3/uL (ref 150–400)
RBC: 4.67 MIL/uL (ref 3.87–5.11)
RDW: 13.1 % (ref 11.5–15.5)
WBC: 6.8 10*3/uL (ref 4.0–10.5)
nRBC: 0 % (ref 0.0–0.2)

## 2023-04-05 LAB — WET PREP, GENITAL
Clue Cells Wet Prep HPF POC: NONE SEEN
Trich, Wet Prep: NONE SEEN
WBC, Wet Prep HPF POC: <10 (ref <10)
Yeast Wet Prep HPF POC: NONE SEEN

## 2023-04-05 MED ORDER — RHO D IMMUNE GLOBULIN 1500 UNIT/2ML IJ SOSY
300.0000 ug | PREFILLED_SYRINGE | Freq: Once | INTRAMUSCULAR | Status: AC
  Administered 2023-04-05: 300 ug via INTRAMUSCULAR
  Filled 2023-04-05: qty 2

## 2023-04-05 MED ORDER — ACETAMINOPHEN 500 MG PO TABS
1000.0000 mg | ORAL_TABLET | Freq: Once | ORAL | Status: AC
  Administered 2023-04-05: 1000 mg via ORAL
  Filled 2023-04-05: qty 2

## 2023-04-05 NOTE — MAU Provider Note (Signed)
History     CSN: [id number]  Arrival date and time: 04/05/23 0229   Event Date/Time   First Provider Initiated Contact with Patient    Chief Complaint  Patient presents with   Abdominal Pain   Vaginal Bleeding    HPI  Amanda Ellison is a 32 y.o. G3P2002 at [redacted]w[redacted]d who presents to the MAU for VB. Pt states she got up around 0200 to urinate, was wiping and noticed brown discharge. She called her husband to bring her to MAU. Has not checked pad since. Reports pain over the top of her abdomen. Last had intercourse on 11/29. Denies urinary pain, f/c, lightheadedness, c/d.   Past Medical History:  Diagnosis Date   Anemia 04/24/2016   Cholelithiasis affecting pregnancy, antepartum    Gestational diabetes    Hypertension     No past surgical history on file.  Family History  Problem Relation Age of Onset   Other Mother        sick one week and died, unknown cause   Other Father        died from car accident   Alcohol abuse Neg Hx    Arthritis Neg Hx    Asthma Neg Hx    Diabetes Neg Hx    Drug abuse Neg Hx    Early death Neg Hx    Birth defects Neg Hx    Cancer Neg Hx    COPD Neg Hx    Depression Neg Hx    Hearing loss Neg Hx    Heart disease Neg Hx    Hyperlipidemia Neg Hx    Hypertension Neg Hx    Kidney disease Neg Hx    Learning disabilities Neg Hx    Mental illness Neg Hx    Mental retardation Neg Hx    Miscarriages / Stillbirths Neg Hx    Stroke Neg Hx    Vision loss Neg Hx    Varicose Veins Neg Hx     Social History   Tobacco Use   Smoking status: Never   Smokeless tobacco: Never  Vaping Use   Vaping status: Never Used  Substance Use Topics   Alcohol use: No   Drug use: No    Allergies: No Known Allergies  Medications Prior to Admission  Medication Sig Dispense Refill Last Dose   Accu-Chek Softclix Lancets lancets 1 each by Other route 4 (four) times daily. 100 each 12    aspirin EC 81 MG tablet Take 2 tablets (162 mg total) by mouth daily.  Take after 12 weeks for prevention of preeclampsia later in pregnancy 300 tablet 2    Blood Glucose Monitoring Suppl (ACCU-CHEK GUIDE) w/Device KIT 1 kit by Does not apply route 4 (four) times daily. 1 kit 0    docusate sodium (COLACE) 100 MG capsule Take 1 capsule (100 mg total) by mouth daily as needed for mild constipation. 30 capsule 9    Glucose Blood (BLOOD GLUCOSE TEST STRIPS 333) STRP 1 strip by In Vitro route 4 (four) times daily. 100 strip 12    NIFEdipine (PROCARDIA-XL/NIFEDICAL-XL) 30 MG 24 hr tablet Take 1 tablet (30 mg total) by mouth daily. Can increase to twice a day as needed for symptomatic contractions (Patient not taking: Reported on 03/25/2023) 30 tablet 2    ondansetron (ZOFRAN) 4 MG tablet Take 1 tablet (4 mg total) by mouth every 8 (eight) hours as needed for nausea or vomiting. (Patient not taking: Reported on 03/25/2023) 20 tablet 1  Prenatal Vit-Fe Fumarate-FA (PRENATAL PLUS VITAMIN/MINERAL) 27-1 MG TABS Take 1 tablet by mouth daily. (Patient not taking: Reported on 03/25/2023) 30 tablet 11    Prenatal Vit-Fe Fumarate-FA (PREPLUS) 27-1 MG TABS Take 1 tablet by mouth daily. 30 tablet 13     ROS reviewed and pertinent positives and negatives as documented in HPI.  Physical Exam   Blood pressure 135/83, pulse 82, temperature 98.3 F (36.8 C), temperature source Oral, resp. rate 17, height 5\' 10"  (1.778 m), weight 101.4 kg, last menstrual period 01/02/2023, SpO2 100%, unknown if currently breastfeeding.  Physical Exam Constitutional:      General: She is not in acute distress.    Appearance: Normal appearance. She is not ill-appearing.  HENT:     Head: Normocephalic and atraumatic.  Cardiovascular:     Rate and Rhythm: Normal rate.  Pulmonary:     Effort: Pulmonary effort is normal.     Breath sounds: Normal breath sounds.  Abdominal:     Palpations: Abdomen is soft.     Tenderness: There is no abdominal tenderness. There is no guarding.  Musculoskeletal:         General: Normal range of motion.  Skin:    General: Skin is warm and dry.     Findings: No rash.  Neurological:     General: No focal deficit present.     Mental Status: She is alert and oriented to person, place, and time.     MAU Course  Procedures  MDM 32 y.o. G3P2002 at [redacted]w[redacted]d presenting for VB in setting of early pregnancy. She is well appearing and has known IUP. I was called to bedside to find FHR as unable to auscultate using doppler. +FCA with FHR of 154 bpm on BSUS. Labs, swabs, imaging ordered and pending. Pt Rh neg, will give RhoGAM.  4:27 AM  U/S reviewed -- SIUP, small subchorionic hemorrhage. D/w pt possible causes of VB include recent intercourse vs subchorionic hemorrhage. Pt understanding. Will give dose of Tylenol and RhoGAM. Pt given information on pelvic rest, requesting note for light duty at work, which was provided. Stable for d/c.   Assessment and Plan  Subchorionic hemorrhage of placenta in first trimester - Plan: Discharge patient Findings d/w pt in detail Rec pelvic rest Stable for d/c  Rh negative state in antepartum period - Plan: Discharge patient RhoGAM given  Sundra Aland, MD OB Fellow, Faculty Practice [facility], Center for [facility]  04/05/2023, 4:27 AM

## 2023-04-05 NOTE — MAU Note (Signed)
.  Amanda Ellison is a 32 y.o. at [redacted]w[redacted]d here in MAU reporting: woke up around 0200 - used the bathroom and noticed brown blood on the tissue when wiping. Reports intermittent abdominal pain across the top of her abdomen. Last intercourse on Friday.   Onset of complaint: 0200 Pain score: 8 Vitals:   04/05/23 0305  BP: 135/83  Pulse: 82  Resp: 17  Temp: 98.3 F (36.8 C)  SpO2: 100%     [id number] to auscultate FHR with doppler-provider notified Lab orders placed from triage:  UA

## 2023-04-06 LAB — GC/CHLAMYDIA PROBE AMP (~~LOC~~) NOT AT ARMC
Chlamydia: NEGATIVE
Comment: NEGATIVE
Comment: NORMAL
Neisseria Gonorrhea: NEGATIVE

## 2023-04-06 LAB — RH IG WORKUP (INCLUDES ABO/RH)
ABO/RH(D): O NEG
Antibody Screen: NEGATIVE
Gestational Age(Wks): 11
Unit division: 0

## 2023-04-07 LAB — PANORAMA PRENATAL TEST FULL PANEL:PANORAMA TEST PLUS 5 ADDITIONAL MICRODELETIONS: FETAL FRACTION: 5.7

## 2023-04-09 LAB — HORIZON CUSTOM: REPORT SUMMARY: POSITIVE — AB

## 2023-04-14 ENCOUNTER — Telehealth: Payer: Self-pay | Admitting: *Deleted

## 2023-04-14 NOTE — Telephone Encounter (Signed)
-----   Message from Richardson Landry sent at 04/14/2023  1:41 PM EST ----- Can we let this patient know that she can opt for partner testing for the alpha thal silent carrier status? She sees Dr. Alvester Morin tomorrow.   Thank you, Huntley Dec

## 2023-04-14 NOTE — Telephone Encounter (Signed)
I called Amanda Ellison and connected withher but after several attempts to review results Informed her we can review at her ob visit tomorrow 04/15/23 as it is not urgent. There was much background noise and we had trouble hearing each other. Nancy Fetter

## 2023-04-15 ENCOUNTER — Encounter: Payer: Self-pay | Admitting: Family Medicine

## 2023-04-15 ENCOUNTER — Ambulatory Visit: Payer: Medicaid Other | Admitting: Family Medicine

## 2023-04-15 ENCOUNTER — Other Ambulatory Visit: Payer: Self-pay

## 2023-04-15 VITALS — BP 128/79 | HR 84 | Wt 228.0 lb

## 2023-04-15 DIAGNOSIS — O208 Other hemorrhage in early pregnancy: Secondary | ICD-10-CM

## 2023-04-15 DIAGNOSIS — O99810 Abnormal glucose complicating pregnancy: Secondary | ICD-10-CM

## 2023-04-15 DIAGNOSIS — Z8632 Personal history of gestational diabetes: Secondary | ICD-10-CM

## 2023-04-15 DIAGNOSIS — Z3A12 12 weeks gestation of pregnancy: Secondary | ICD-10-CM

## 2023-04-15 DIAGNOSIS — O0990 Supervision of high risk pregnancy, unspecified, unspecified trimester: Secondary | ICD-10-CM

## 2023-04-15 DIAGNOSIS — O09291 Supervision of pregnancy with other poor reproductive or obstetric history, first trimester: Secondary | ICD-10-CM

## 2023-04-15 DIAGNOSIS — R7303 Prediabetes: Secondary | ICD-10-CM

## 2023-04-15 DIAGNOSIS — Z8719 Personal history of other diseases of the digestive system: Secondary | ICD-10-CM

## 2023-04-15 DIAGNOSIS — O10919 Unspecified pre-existing hypertension complicating pregnancy, unspecified trimester: Secondary | ICD-10-CM

## 2023-04-15 DIAGNOSIS — O10011 Pre-existing essential hypertension complicating pregnancy, first trimester: Secondary | ICD-10-CM

## 2023-04-15 NOTE — Progress Notes (Signed)
   PRENATAL VISIT NOTE  Subjective:  Amanda Ellison is a 32 y.o. G3P2002 at [redacted]w[redacted]d being seen today for ongoing prenatal care.  She is currently monitored for the following issues for this high-risk pregnancy and has Rh negative, antepartum; History of gestational diabetes; History of cholestasis during pregnancy; Prediabetes; Class 1 obesity due to excess calories with serious comorbidity in adult; Chronic hypertension affecting pregnancy; Supervision of high risk pregnancy, antepartum; and Subchorionic hemorrhage of placenta in first trimester on their problem list.  Patient reports no complaints.  Contractions: Not present. Vag. Bleeding: None.  Movement: Absent. Denies leaking of fluid.   The following portions of the patient's history were reviewed and updated as appropriate: allergies, current medications, past family history, past medical history, past social history, past surgical history and problem list.   Objective:   Vitals:   04/15/23 1414  BP: 128/79  Pulse: 84  Weight: 228 lb (103.4 kg)    Fetal Status: Fetal Heart Rate (bpm): 153   Movement: Absent     General:  Alert, oriented and cooperative. Patient is in no acute distress.  Skin: Skin is warm and dry. No rash noted.   Cardiovascular: Normal heart rate noted  Respiratory: Normal respiratory effort, no problems with respiration noted  Abdomen: Soft, gravid, appropriate for gestational age.  Pain/Pressure: Absent     Pelvic: Cervical exam deferred        Extremities: Normal range of motion.  Edema: None  Mental Status: Normal mood and affect. Normal behavior. Normal judgment and thought content.   Assessment and Plan:  Pregnancy: G3P2002 at [redacted]w[redacted]d 1. History of cholestasis during pregnancy Monitor  Increases risk in this pregnancy  2. History of gestational diabetes Needs early GTT  3. Chronic hypertension affecting pregnancy BP WNL today Continue procardia, though patient reports not taking  4.  Subchorionic hemorrhage of placenta in first trimester Seen in MAU  5. Prediabetes HA1C 5.9% Ordered early 2 hr GTT Also has DM supplies which are useful  6. Supervision of high risk pregnancy, antepartum Up to date Improving bleeding Discussed ASA today, recommended starting and patient agrees  Preterm labor symptoms and general obstetric precautions including but not limited to vaginal bleeding, contractions, leaking of fluid and fetal movement were reviewed in detail with the patient. Please refer to After Visit Summary for other counseling recommendations.   Return in about 4 weeks (around 05/13/2023) for Centering Pregnancy, scheduled visit.  Future Appointments  Date Time Provider Department Center  05/21/2023  9:00 AM CENTERING PROVIDER [facility]  06/01/2023  2:15 PM [facility] NURSE [facility]  06/01/2023  2:30 PM [facility] US2 [facility]  06/18/2023  9:00 AM CENTERING PROVIDER [facility]  07/16/2023  9:00 AM CENTERING PROVIDER [facility]  07/30/2023  9:00 AM CENTERING PROVIDER [facility]  08/13/2023  9:00 AM CENTERING PROVIDER [facility]  08/27/2023  9:00 AM CENTERING PROVIDER [facility]  09/10/2023  9:00 AM CENTERING PROVIDER [facility]  09/24/2023  9:00 AM CENTERING PROVIDER [facility]  10/08/2023  9:00 AM CENTERING PROVIDER [facility]  10/22/2023  9:00 AM CENTERING PROVIDER [facility]    Federico Flake, MD

## 2023-04-15 NOTE — Telephone Encounter (Signed)
Patient was seen in office today and this was addressed with patient by provider. Amanda Ellison

## 2023-04-20 ENCOUNTER — Other Ambulatory Visit: Payer: Medicaid Other

## 2023-05-06 NOTE — L&D Delivery Note (Signed)
   Delivery Note:   [id number] at [redacted]w[redacted]d  Admitting diagnosis: Uncontrolled diabetes mellitus with hyperglycemia (HCC) [E11.65] Risks:  Patient Active Problem List   Diagnosis Date Noted   Uncontrolled diabetes mellitus with hyperglycemia (HCC) 10/04/2023   LGA (large for gestational age) fetus affecting management of mother 08/12/2023   Obesity affecting pregnancy, antepartum 05/25/2023   Alpha thalassemia silent carrier 05/25/2023   Supervision of high risk pregnancy, antepartum 03/23/2023   Chronic hypertension affecting pregnancy 03/13/2023   Class 1 obesity due to excess calories with serious comorbidity in adult 02/14/2020   History of cholestasis during pregnancy 10/26/2019   Gestational diabetes mellitus 12/26/2014   Rh negative, antepartum 10/22/2014     First Stage:  Induction of labor: for Uncontrolled GDM. 10/04/23 Onset of labor: @ 2131 on 6/1 Augmentation: AROM, Pitocin , and Cytotec  ROM: AROM  Active labor onset: @ 2131 Analgesia /Anesthesia/Pain control intrapartum: Epidural;Local  Second Stage:  Complete dilation at 10/05/2023 0112 Onset of pushing at 0113 FHR second stage Cat I  moderate variability 15x15 accels no decels.    CNM called to patient bedside. Patient Pushing in lithotomy  position with CNM and L&D staff support. FOB present for birth and supportive.   Delivery of a Live born female  Birth Weight: 8 lb 11 oz (3940 g) APGAR: 8, 9  Newborn Delivery   Birth date/time: 10/05/2023 01:44:00 Delivery type: Vaginal, Spontaneous    With great maternal pushing efforts, fetal head delivered in cephalic presentation, position DOA and spontaneously restituted to LOA position. Remaining fetal body delivered with ease. Vigorously crying infant placed immediately skin to skin.  Nuchal Cord: No    After several mins of life cord double clamped after cessation of pulsation, and cut by B. Alyne Jules RN at patient and family request.   Collection of cord blood for  typing completed. Cord blood donation-None Arterial cord blood sample-No   Third Stage:  With Gentle LUS massage and maternal pushing efforts Placenta delivered-Spontaneous  intact with 3 vessels. Uterine tone firm bleeding scant  Uterotonics: IV pit bolus initiated  Placenta to L&D for dispo .  2nd degree perineal   laceration identified.  Episiotomy:None Local analgesia: 1% Lidocaine    Repair:2nd degree repaired in traditional fashion to hemostasis using a 3.0 monorcryl.  Est. Blood Loss (mL):108 Complications: None   Mom to postpartum.  Baby girl "Himaya" to Couplet care / Skin to Skin.  Delivery Report:  Review the Delivery Report for details.    Mailey Landstrom Maurie Southern) Marlys Singh, MSN, CNM  Center for [facility]  10/05/23 2:15 AM

## 2023-05-21 ENCOUNTER — Ambulatory Visit (INDEPENDENT_AMBULATORY_CARE_PROVIDER_SITE_OTHER): Payer: Medicaid Other | Admitting: Advanced Practice Midwife

## 2023-05-21 VITALS — BP 137/90 | HR 83 | Wt 231.8 lb

## 2023-05-21 DIAGNOSIS — Z3A17 17 weeks gestation of pregnancy: Secondary | ICD-10-CM

## 2023-05-21 DIAGNOSIS — Z6791 Unspecified blood type, Rh negative: Secondary | ICD-10-CM

## 2023-05-21 DIAGNOSIS — O0992 Supervision of high risk pregnancy, unspecified, second trimester: Secondary | ICD-10-CM

## 2023-05-21 DIAGNOSIS — E6609 Other obesity due to excess calories: Secondary | ICD-10-CM

## 2023-05-21 DIAGNOSIS — O10912 Unspecified pre-existing hypertension complicating pregnancy, second trimester: Secondary | ICD-10-CM

## 2023-05-21 DIAGNOSIS — O10919 Unspecified pre-existing hypertension complicating pregnancy, unspecified trimester: Secondary | ICD-10-CM

## 2023-05-21 DIAGNOSIS — O0990 Supervision of high risk pregnancy, unspecified, unspecified trimester: Secondary | ICD-10-CM

## 2023-05-21 DIAGNOSIS — O26899 Other specified pregnancy related conditions, unspecified trimester: Secondary | ICD-10-CM

## 2023-05-21 DIAGNOSIS — Z6832 Body mass index (BMI) 32.0-32.9, adult: Secondary | ICD-10-CM

## 2023-05-21 DIAGNOSIS — Z8632 Personal history of gestational diabetes: Secondary | ICD-10-CM

## 2023-05-21 DIAGNOSIS — E66811 Obesity, class 1: Secondary | ICD-10-CM

## 2023-05-21 DIAGNOSIS — O26892 Other specified pregnancy related conditions, second trimester: Secondary | ICD-10-CM

## 2023-05-21 MED ORDER — PNV PRENATAL PLUS MULTIVIT+DHA 27-1 & 312 MG PO MISC: 1.0000 | Freq: Every day | ORAL | 12 refills | Status: DC

## 2023-05-21 MED ORDER — NIFEDIPINE ER OSMOTIC RELEASE 30 MG PO TB24: 30.0000 mg | ORAL_TABLET | Freq: Every day | ORAL | 6 refills | Status: DC

## 2023-05-21 NOTE — Progress Notes (Signed)
PRENATAL VISIT NOTE  Subjective:  Amanda Ellison is a 33 y.o. G3P2002 at [redacted]w[redacted]d being seen today for ongoing prenatal care.  She is currently monitored for the following issues for this high-risk pregnancy and has Rh negative, antepartum; History of gestational diabetes; History of cholestasis during pregnancy; Prediabetes; Class 1 obesity due to excess calories with serious comorbidity in adult; Chronic hypertension affecting pregnancy; Supervision of high risk pregnancy, antepartum; Subchorionic hemorrhage of placenta in first trimester; Obesity affecting pregnancy, antepartum; Elevated hemoglobin A1c; and Alpha thalassemia silent carrier on their problem list.  Patient reports no complaints.  Contractions: Not present. Vag. Bleeding: None.  Movement: Present. Denies leaking of fluid.   The following portions of the patient's history were reviewed and updated as appropriate: allergies, current medications, past family history, past medical history, past social history, past surgical history and problem list.   Pt can only come to appt at 11:30 due to spouse's availability.   Objective:   Vitals:   05/21/23 0952  BP: (!) 137/90  Pulse: 83  Weight: 231 lb 12.8 oz (105.1 kg)    Fetal Status: Fetal Heart Rate (bpm): 152 Fundal Height: 18 cm Movement: Present     General:  Alert, oriented and cooperative. Patient is in no acute distress.  Skin: Skin is warm and dry. No rash noted.   Cardiovascular: Normal heart rate noted  Respiratory: Normal respiratory effort, no problems with respiration noted  Abdomen: Soft, gravid, appropriate for gestational age.  Pain/Pressure: Absent     Pelvic: Cervical exam deferred        Extremities: Normal range of motion.  Edema: None  Mental Status: Normal mood and affect. Normal behavior. Normal judgment and thought content.   Assessment and Plan:  Pregnancy: G3P2002 at [redacted]w[redacted]d 1. Chronic hypertension affecting pregnancy (Primary) Mildly elevated  BP Unable to get Rx at pharmacy. Not taking. Rx re-sent.  - NIFEdipine (PROCARDIA-XL/NIFEDICAL-XL) 30 MG 24 hr tablet; Take 1 tablet (30 mg total) by mouth daily. Can increase to twice a day as needed for symptomatic contractions  Dispense: 30 tablet; Refill: 6  2. Rh negative, antepartum - Rho at 28 weeks   3. History of gestational diabetes - Early Hgb A1C 5.9. GTT ordered but [facility].   4. Class 1 obesity due to excess calories with serious comorbidity and body mass index (BMI) of 32.0 to 32.9 in adult - Antenatal testing per MFM  5. Supervision of high risk pregnancy, antepartum - Switch to traditional care due to not being available for 9:00 Centering appts.  - Prenatal Vit-Fe Fum-FA-Omega (PNV PRENATAL PLUS MULTIVIT+DHA) 27-1 & 312 MG MISC; Take 1 tablet by mouth daily.  Dispense: 30 each; Refill: 12  6. [redacted] weeks gestation of pregnancy - Plans AFP when she returns for anatomy US. - Prenatal Vit-Fe Fum-FA-Omega (PNV PRENATAL PLUS MULTIVIT+DHA) 27-1 & 312 MG MISC; Take 1 tablet by mouth daily.  Dispense: 30 each; Refill: 12  Preterm labor symptoms and general obstetric precautions including but not limited to vaginal bleeding, contractions, leaking of fluid and fetal movement were reviewed in detail with the patient. Please refer to After Visit Summary for other counseling recommendations.   No follow-ups on file.  Future Appointments  Date Time Provider Department Center  06/01/2023  1:30 PM [facility] LAB [facility]  06/01/2023  2:15 PM [facility] NURSE [facility]  06/01/2023  2:30 PM [facility] US2 [facility] [place]  06/18/2023 10:55 AM Reva Bores, MD [facility]    Dorathy Kinsman, CNM

## 2023-05-21 NOTE — Progress Notes (Signed)
Pt is not taking ASA 81 mg or Nifedipine 30 mg - states pharmacy did not have them

## 2023-05-23 ENCOUNTER — Inpatient Hospital Stay (HOSPITAL_COMMUNITY)
Admission: AD | Admit: 2023-05-23 | Discharge: 2023-05-23 | Disposition: A | Discharge status: Home / Self Care | Payer: Medicaid Other | Attending: Obstetrics & Gynecology | Admitting: Obstetrics & Gynecology

## 2023-05-23 DIAGNOSIS — J101 Influenza due to other identified influenza virus with other respiratory manifestations: Secondary | ICD-10-CM | POA: Diagnosis not present

## 2023-05-23 DIAGNOSIS — O99512 Diseases of the respiratory system complicating pregnancy, second trimester: Secondary | ICD-10-CM | POA: Diagnosis present

## 2023-05-23 DIAGNOSIS — Z1152 Encounter for screening for COVID-19: Secondary | ICD-10-CM | POA: Diagnosis not present

## 2023-05-23 DIAGNOSIS — J111 Influenza due to unidentified influenza virus with other respiratory manifestations: Secondary | ICD-10-CM

## 2023-05-23 DIAGNOSIS — O98512 Other viral diseases complicating pregnancy, second trimester: Secondary | ICD-10-CM | POA: Diagnosis not present

## 2023-05-23 DIAGNOSIS — Z3A17 17 weeks gestation of pregnancy: Secondary | ICD-10-CM | POA: Insufficient documentation

## 2023-05-23 LAB — URINALYSIS, ROUTINE W REFLEX MICROSCOPIC
Bilirubin Urine: NEGATIVE
Glucose, UA: NEGATIVE mg/dL
Hgb urine dipstick: NEGATIVE
Ketones, ur: NEGATIVE mg/dL
Nitrite: NEGATIVE
Protein, ur: NEGATIVE mg/dL
Specific Gravity, Urine: 1.001 — ABNORMAL LOW (ref 1.005–1.030)
pH: 8 (ref 5.0–8.0)

## 2023-05-23 LAB — RESP PANEL BY RT-PCR (RSV, FLU A&B, COVID)  RVPGX2
Influenza A by PCR: POSITIVE — AB
Influenza B by PCR: NEGATIVE
Resp Syncytial Virus by PCR: NEGATIVE
SARS Coronavirus 2 by RT PCR: NEGATIVE

## 2023-05-23 MED ORDER — BENZONATATE 100 MG PO CAPS
200.0000 mg | ORAL_CAPSULE | Freq: Once | ORAL | Status: AC
  Administered 2023-05-23: 200 mg via ORAL
  Filled 2023-05-23: qty 2

## 2023-05-23 MED ORDER — OSELTAMIVIR PHOSPHATE 75 MG PO CAPS: 75.0000 mg | ORAL_CAPSULE | Freq: Two times a day (BID) | ORAL | 0 refills | Status: DC

## 2023-05-23 MED ORDER — BENZONATATE 100 MG PO CAPS: 100.0000 mg | ORAL_CAPSULE | Freq: Three times a day (TID) | ORAL | 0 refills | Status: DC

## 2023-05-23 NOTE — MAU Provider Note (Signed)
Chief Complaint: Nasal Congestion and Cough   Event Date/Time   First Provider Initiated Contact with Patient 05/23/23 0844      SUBJECTIVE HPI: Amanda Ellison is a 33 y.o. G3P2002 at [redacted]w[redacted]d who presents to maternity admissions reporting cough, nasal congestion x 24 hours.  She feels hot, but has not taken her temperature. There is no shortness of breath or chest pain. She denies known sick contacts.  She has not taken any medicine and was not sure what was safe to take.    HPI  Past Medical History:  Diagnosis Date   Anemia 04/24/2016   Cholelithiasis affecting pregnancy, antepartum    Gestational diabetes    Hypertension    No past surgical history on file. Social History   Socioeconomic History   Marital status: Married    Spouse name: [name]     Number of children: 1   Years of education: Not on file   Highest education level: Not on file  Occupational History   Occupation: Housekeeping  Tobacco Use   Smoking status: Never   Smokeless tobacco: Never  Vaping Use   Vaping status: Never Used  Substance and Sexual Activity   Alcohol use: No   Drug use: No   Sexual activity: Yes    Partners: Male    Birth control/protection: None  Other Topics Concern   Not on file  Social History Narrative   Not on file   Social Drivers of Health   Financial Resource Strain: Not on file  Food Insecurity: Not on file  Transportation Needs: Not on file  Physical Activity: Not on file  Stress: Not on file (06/14/2019)  Social Connections: Not on file  Intimate Partner Violence: Low Risk  (08/19/2019)   [facility]   Intimate Partner Violence    Insults You: Not on file    Threatens You: Not on file    Screams at You: Not on file    Physically Hurt: Not on file    Intimate Partner Violence Score: Not on file   No current facility-administered medications on file prior to encounter.   Current Outpatient Medications on File Prior  to Encounter  Medication Sig Dispense Refill   Accu-Chek Softclix Lancets lancets 1 each by Other route 4 (four) times daily. 100 each 12   aspirin EC 81 MG tablet Take 2 tablets (162 mg total) by mouth daily. Take after 12 weeks for prevention of preeclampsia later in pregnancy (Patient not taking: Reported on 05/21/2023) 300 tablet 2   Blood Glucose Monitoring Suppl (ACCU-CHEK GUIDE) w/Device KIT 1 kit by Does not apply route 4 (four) times daily. 1 kit 0   docusate sodium (COLACE) 100 MG capsule Take 1 capsule (100 mg total) by mouth daily as needed for mild constipation. (Patient not taking: Reported on 05/21/2023) 30 capsule 9   Glucose Blood (BLOOD GLUCOSE TEST STRIPS 333) STRP 1 strip by In Vitro route 4 (four) times daily. 100 strip 12   NIFEdipine (PROCARDIA-XL/NIFEDICAL-XL) 30 MG 24 hr tablet Take 1 tablet (30 mg total) by mouth daily. Can increase to twice a day as needed for symptomatic contractions 30 tablet 6   Prenatal Vit-Fe Fum-FA-Omega (PNV PRENATAL PLUS MULTIVIT+DHA) 27-1 & 312 MG MISC Take 1 tablet by mouth daily. 30 each 12   Prenatal Vit-Fe Fumarate-FA (PREPLUS) 27-1 MG TABS Take 1 tablet by mouth daily. (Patient not taking: Reported on 05/21/2023) 30 tablet 13   No Known Allergies  ROS:  Review of Systems  Constitutional:  Negative for chills, fatigue and fever.  HENT:  Positive for congestion and rhinorrhea.   Respiratory:  Positive for cough. Negative for shortness of breath.   Cardiovascular:  Negative for chest pain.  Genitourinary:  Negative for difficulty urinating, dysuria, flank pain, pelvic pain, vaginal bleeding, vaginal discharge and vaginal pain.  Neurological:  Negative for dizziness and headaches.  Psychiatric/Behavioral: Negative.       I have reviewed patient's Past Medical Hx, Surgical Hx, Family Hx, Social Hx, medications and allergies.   Physical Exam  Patient Vitals for the past 24 hrs:  BP Temp Temp src Pulse Resp SpO2  05/23/23 0745 122/80 98.3  F (36.8 C) Oral 85 18 100 %   Constitutional: Well-developed, well-nourished female in no acute distress.  Cardiovascular: normal rate Respiratory: normal effort GI: Abd soft, non-tender. Pos BS x 4 MS: Extremities nontender, no edema, normal ROM Neurologic: Alert and oriented x 4.  GU: Neg CVAT.  PELVIC EXAM: Deferred  FHT 143 by doppler  LAB RESULTS Results for orders placed or performed during the hospital encounter of 05/23/23 (from the past 24 hours)  Resp panel by RT-PCR (RSV, Flu A&B, Covid) Anterior Nasal Swab     Status: Abnormal   Collection Time: 05/23/23  7:50 AM   Specimen: Anterior Nasal Swab  Result Value Ref Range   SARS Coronavirus 2 by RT PCR NEGATIVE NEGATIVE   Influenza A by PCR POSITIVE (A) NEGATIVE   Influenza B by PCR NEGATIVE NEGATIVE   Resp Syncytial Virus by PCR NEGATIVE NEGATIVE  Urinalysis, Routine w reflex microscopic -Urine, Clean Catch     Status: Abnormal   Collection Time: 05/23/23  8:07 AM  Result Value Ref Range   Color, Urine STRAW (A) YELLOW   APPearance CLEAR CLEAR   Specific Gravity, Urine 1.001 (L) 1.005 - 1.030   pH 8.0 5.0 - 8.0   Glucose, UA NEGATIVE NEGATIVE mg/dL   Hgb urine dipstick NEGATIVE NEGATIVE   Bilirubin Urine NEGATIVE NEGATIVE   Ketones, ur NEGATIVE NEGATIVE mg/dL   Protein, ur NEGATIVE NEGATIVE mg/dL   Nitrite NEGATIVE NEGATIVE   Leukocytes,Ua TRACE (A) NEGATIVE   RBC / HPF 0-5 0 - 5 RBC/hpf   WBC, UA 0-5 0 - 5 WBC/hpf   Bacteria, UA RARE (A) NONE SEEN   Squamous Epithelial / HPF 6-10 0 - 5 /HPF    --/--/O NEG (12/01 0331)  IMAGING No results found.  MAU Management/MDM: Orders Placed This Encounter  Procedures   Resp panel by RT-PCR (RSV, Flu A&B, Covid) Anterior Nasal Swab   Urinalysis, Routine w reflex microscopic -Urine, Clean Catch   Airborne and Contact precautions   Discharge patient    Meds ordered this encounter  Medications   benzonatate (TESSALON) capsule 200 mg   oseltamivir (TAMIFLU) 75  MG capsule    Sig: Take 1 capsule (75 mg total) by mouth every 12 (twelve) hours.    Dispense:  10 capsule    Refill:  0    Supervising Provider:   Venora Maples [[id number]]   benzonatate (TESSALON) 100 MG capsule    Sig: Take 1 capsule (100 mg total) by mouth every 8 (eight) hours.    Dispense:  21 capsule    Refill:  0    Supervising Provider:   Venora Maples [[id number]]    Tessalon Perles given in MAU and Rx sent.  Testing positive for influenza A, so Tamiflu Rx sent.  Reviewed results and  Rx with patient.  List of safe OTC medications for URI given. Return precautions given.    ASSESSMENT 1. Influenza A   2. URI due to influenza   3. [redacted] weeks gestation of pregnancy     PLAN Discharge home Allergies as of 05/23/2023   No Known Allergies      Medication List     TAKE these medications    Accu-Chek Guide w/Device Kit 1 kit by Does not apply route 4 (four) times daily.   Accu-Chek Softclix Lancets lancets 1 each by Other route 4 (four) times daily.   aspirin EC 81 MG tablet Take 2 tablets (162 mg total) by mouth daily. Take after 12 weeks for prevention of preeclampsia later in pregnancy   benzonatate 100 MG capsule Commonly known as: TESSALON Take 1 capsule (100 mg total) by mouth every 8 (eight) hours.   Blood Glucose Test Strips 333 Strp 1 strip by In Vitro route 4 (four) times daily.   docusate sodium 100 MG capsule Commonly known as: Colace Take 1 capsule (100 mg total) by mouth daily as needed for mild constipation.   NIFEdipine 30 MG 24 hr tablet Commonly known as: PROCARDIA-XL/NIFEDICAL-XL Take 1 tablet (30 mg total) by mouth daily. Can increase to twice a day as needed for symptomatic contractions   oseltamivir 75 MG capsule Commonly known as: TAMIFLU Take 1 capsule (75 mg total) by mouth every 12 (twelve) hours.   PNV Prenatal Plus Multivit+DHA 27-1 & 312 MG Misc Take 1 tablet by mouth daily.   PrePLUS 27-1 MG Tabs Take 1 tablet by mouth  daily.        Follow-up Information     [facility] Follow up.   Specialty: Obstetrics and Gynecology Why: As scheduled Contact information: [address] [place] [address] [phone]        [facility] Follow up.   Specialty: Obstetrics and Gynecology Why: As needed for emergencies Contact information: [address] [place] [address] [phone]                Sharen Counter Certified Nurse-Midwife 05/23/2023  3:29 PM

## 2023-05-23 NOTE — Discharge Instructions (Signed)

## 2023-05-23 NOTE — MAU Note (Signed)
..  Amanda Ellison is a 33 y.o. at [redacted]w[redacted]d here in MAU reporting: since last night she has had a cough, runny nose, and feels like her body is hot. Has not been around anyone that is sick that she is aware of. Denies VB or LOF. Does feel pain in her left lower abdomen when she coughs.  Pain score: 2 Vitals:   05/23/23 0745  BP: 122/80  Pulse: 85  Resp: 18  Temp: 98.3 F (36.8 C)  SpO2: 100%     FHT:143 Lab orders placed from triage:   Resp panel, UA

## 2023-05-25 DIAGNOSIS — D563 Thalassemia minor: Secondary | ICD-10-CM | POA: Insufficient documentation

## 2023-05-25 DIAGNOSIS — O99210 Obesity complicating pregnancy, unspecified trimester: Secondary | ICD-10-CM | POA: Insufficient documentation

## 2023-05-25 DIAGNOSIS — R7309 Other abnormal glucose: Secondary | ICD-10-CM | POA: Insufficient documentation

## 2023-06-01 ENCOUNTER — Encounter: Payer: Self-pay | Admitting: *Deleted

## 2023-06-01 ENCOUNTER — Ambulatory Visit (HOSPITAL_BASED_OUTPATIENT_CLINIC_OR_DEPARTMENT_OTHER): Payer: Medicaid Other | Admitting: Obstetrics and Gynecology

## 2023-06-01 ENCOUNTER — Ambulatory Visit: Payer: Medicaid Other | Attending: Family Medicine

## 2023-06-01 ENCOUNTER — Other Ambulatory Visit: Payer: Medicaid Other

## 2023-06-01 ENCOUNTER — Other Ambulatory Visit: Payer: Self-pay

## 2023-06-01 ENCOUNTER — Ambulatory Visit: Payer: Medicaid Other | Admitting: *Deleted

## 2023-06-01 VITALS — BP 145/80 | HR 82

## 2023-06-01 DIAGNOSIS — O99212 Obesity complicating pregnancy, second trimester: Secondary | ICD-10-CM | POA: Diagnosis present

## 2023-06-01 DIAGNOSIS — R7309 Other abnormal glucose: Secondary | ICD-10-CM | POA: Diagnosis present

## 2023-06-01 DIAGNOSIS — E669 Obesity, unspecified: Secondary | ICD-10-CM

## 2023-06-01 DIAGNOSIS — D563 Thalassemia minor: Secondary | ICD-10-CM | POA: Diagnosis present

## 2023-06-01 DIAGNOSIS — O10012 Pre-existing essential hypertension complicating pregnancy, second trimester: Secondary | ICD-10-CM | POA: Diagnosis not present

## 2023-06-01 DIAGNOSIS — O99210 Obesity complicating pregnancy, unspecified trimester: Secondary | ICD-10-CM

## 2023-06-01 DIAGNOSIS — Z148 Genetic carrier of other disease: Secondary | ICD-10-CM | POA: Insufficient documentation

## 2023-06-01 DIAGNOSIS — E6689 Other obesity not elsewhere classified: Secondary | ICD-10-CM | POA: Insufficient documentation

## 2023-06-01 DIAGNOSIS — O360120 Maternal care for anti-D [Rh] antibodies, second trimester, not applicable or unspecified: Secondary | ICD-10-CM

## 2023-06-01 DIAGNOSIS — O0990 Supervision of high risk pregnancy, unspecified, unspecified trimester: Secondary | ICD-10-CM | POA: Diagnosis present

## 2023-06-01 DIAGNOSIS — Z3A19 19 weeks gestation of pregnancy: Secondary | ICD-10-CM | POA: Insufficient documentation

## 2023-06-01 DIAGNOSIS — O10919 Unspecified pre-existing hypertension complicating pregnancy, unspecified trimester: Secondary | ICD-10-CM

## 2023-06-01 DIAGNOSIS — O99012 Anemia complicating pregnancy, second trimester: Secondary | ICD-10-CM

## 2023-06-01 DIAGNOSIS — O09292 Supervision of pregnancy with other poor reproductive or obstetric history, second trimester: Secondary | ICD-10-CM

## 2023-06-01 NOTE — Progress Notes (Signed)
  Maternal-Fetal Medicine  I had the pleasure of seeing Amanda Ellison today at [facility]. She is GG3 P2002 at 19-weeks' gestation and is here for fetal anatomy scan.  On cell-free fetal DNA screening, the risks of fetal aneuploidies are not increased.  Patient has chronic hypertension, and she was prescribed nifedipine at your office.  She will be starting her medications from today.  Obstetric history significant for 2 term vaginal deliveries.  Her last pregnancy was complicated by gestational diabetes and cholestasis. Patient is yet to screen for gestational diabetes. She is a silent carrier for alpha thalassemia.  Ultrasound We performed fetal anatomical survey.  Amniotic fluid is normal good fetal activity seen.  No markers of aneuploidies or obvious fetal structural defects are seen.  Fetal anatomical survey is very limited because of maternal body habitus and fetal position.  Patient understands the limitations of ultrasound in detecting fetal anomalies.  As maternal obesity imposes limitations of the resolution of images, fetal anomalies may be missed.  Our concerns include: Chronic hypertension -Adverse outcomes of severe chronic hypertension include maternal stroke, endorgan damage, coagulation disturbances. Placental abruption is more common. -Superimposed preeclampsia occurs in more than 30% of women with chronic hypertension I discussed the benefit of low-dose aspirin prophylaxis that helps delaying or preventing preeclampsia. I encouraged her to take low-dose aspirin daily. -I discussed the safety profile of antihypertensives.  Nifedipine can be safely given in pregnancy.  Alternative medications include labetalol. -I discussed ultrasound protocol of monitoring fetal growth assessment and antenatal testing. -Timing of delivery: Provided her blood pressures are well controlled, she can be delivered at 39 weeks' gestation.  Early term delivery is an option if  hypertension is not well controlled.  History of cholestasis Recurrence of cholestasis and subsequent pregnancies is up to 90%.  Patient does not have itching symptoms now.  I briefly discussed alpha thalassemia silent carrier status.  I discussed partner screening and encouraged her to get her partner screened.  Recommendations -An appointment was made for her to return in 5 weeks for completion of fetal anatomy. -Serial growth assessments every 4 weeks till delivery. - Weekly BPP from [redacted] weeks gestation till delivery. -Aspirin 81 mg daily till delivery. -Continue nifedipine.  Thank you for consultation.  If you have any questions or concerns, please contact me the [facility].  Consultation including face-to-face (more than 50%) counseling 30 minutes.

## 2023-06-02 ENCOUNTER — Other Ambulatory Visit: Payer: Self-pay | Admitting: *Deleted

## 2023-06-02 DIAGNOSIS — O10919 Unspecified pre-existing hypertension complicating pregnancy, unspecified trimester: Secondary | ICD-10-CM

## 2023-06-03 LAB — AFP, SERUM, OPEN SPINA BIFIDA
AFP MoM: 0.87
AFP Value: 36.1 ng/mL
Gest. Age on Collection Date: 19 wk
Maternal Age At EDD: 34.4 a
OSBR Risk 1 IN: 10000
Test Results:: NEGATIVE
Weight: 231 [lb_av]

## 2023-06-18 ENCOUNTER — Ambulatory Visit: Payer: Medicaid Other | Admitting: Family Medicine

## 2023-06-18 VITALS — BP 112/71 | HR 96 | Wt 234.0 lb

## 2023-06-18 DIAGNOSIS — Z3A21 21 weeks gestation of pregnancy: Secondary | ICD-10-CM

## 2023-06-18 DIAGNOSIS — O10912 Unspecified pre-existing hypertension complicating pregnancy, second trimester: Secondary | ICD-10-CM

## 2023-06-18 DIAGNOSIS — O0990 Supervision of high risk pregnancy, unspecified, unspecified trimester: Secondary | ICD-10-CM

## 2023-06-18 DIAGNOSIS — R7303 Prediabetes: Secondary | ICD-10-CM | POA: Diagnosis not present

## 2023-06-18 DIAGNOSIS — O0992 Supervision of high risk pregnancy, unspecified, second trimester: Secondary | ICD-10-CM

## 2023-06-18 DIAGNOSIS — Z3A17 17 weeks gestation of pregnancy: Secondary | ICD-10-CM

## 2023-06-18 DIAGNOSIS — O26899 Other specified pregnancy related conditions, unspecified trimester: Secondary | ICD-10-CM

## 2023-06-18 DIAGNOSIS — D563 Thalassemia minor: Secondary | ICD-10-CM

## 2023-06-18 DIAGNOSIS — O26892 Other specified pregnancy related conditions, second trimester: Secondary | ICD-10-CM

## 2023-06-18 DIAGNOSIS — O10919 Unspecified pre-existing hypertension complicating pregnancy, unspecified trimester: Secondary | ICD-10-CM

## 2023-06-18 DIAGNOSIS — Z6791 Unspecified blood type, Rh negative: Secondary | ICD-10-CM

## 2023-06-18 MED ORDER — NIFEDIPINE ER OSMOTIC RELEASE 30 MG PO TB24: 30.0000 mg | ORAL_TABLET | Freq: Every day | ORAL | 6 refills | Status: DC

## 2023-06-18 MED ORDER — PNV PRENATAL PLUS MULTIVIT+DHA 27-1 & 312 MG PO MISC: 1.0000 | Freq: Every day | ORAL | 12 refills | Status: AC

## 2023-06-18 MED ORDER — ASPIRIN 81 MG PO TBEC: 162.0000 mg | DELAYED_RELEASE_TABLET | Freq: Every day | ORAL | 2 refills | Status: DC

## 2023-06-18 NOTE — Progress Notes (Signed)
   PRENATAL VISIT NOTE  Subjective:  Amanda Ellison is a 33 y.o. G3P2002 at [redacted]w[redacted]d being seen today for ongoing prenatal care.  She is currently monitored for the following issues for this high-risk pregnancy and has Rh negative, antepartum; History of gestational diabetes; History of cholestasis during pregnancy; Prediabetes; Class 1 obesity due to excess calories with serious comorbidity in adult; Chronic hypertension affecting pregnancy; Supervision of high risk pregnancy, antepartum; Subchorionic hemorrhage of placenta in first trimester; Obesity affecting pregnancy, antepartum; Elevated hemoglobin A1c; and Alpha thalassemia silent carrier on their problem list.  Patient reports no complaints.  Contractions: Not present. Vag. Bleeding: None.  Movement: Present. Denies leaking of fluid.   The following portions of the patient's history were reviewed and updated as appropriate: allergies, current medications, past family history, past medical history, past social history, past surgical history and problem list.   Objective:   Vitals:   06/18/23 1117 06/18/23 1147  BP: (!) 141/62 112/71  Pulse: (!) 105 96  Weight: 234 lb (106.1 kg)     Fetal Status: Fetal Heart Rate (bpm): 142   Movement: Present     General:  Alert, oriented and cooperative. Patient is in no acute distress.  Skin: Skin is warm and dry. No rash noted.   Cardiovascular: Normal heart rate noted  Respiratory: Normal respiratory effort, no problems with respiration noted  Abdomen: Soft, gravid, appropriate for gestational age.  Pain/Pressure: Absent     Pelvic: Cervical exam deferred        Extremities: Normal range of motion.  Edema: None  Mental Status: Normal mood and affect. Normal behavior. Normal judgment and thought content.   Assessment and Plan:  Pregnancy: G3P2002 at [redacted]w[redacted]d 1. [redacted] weeks gestation of pregnancy (Primary)   2. Supervision of high risk pregnancy, antepartum S/p anatomy US - Prenatal Vit-Fe  Fum-FA-Omega (PNV PRENATAL PLUS MULTIVIT+DHA) 27-1 & 312 MG MISC; Take 1 tablet by mouth daily.  Dispense: 30 each; Refill: 12  3. Rh negative, antepartum Will need Rhogam  4. Prediabetes Did not do 1 hour Reports CBG monitoring as follows Reports fastings blood sugars 85 2 hours pp 105  5. Alpha thalassemia silent carrier   6. Chronic hypertension affecting pregnancy Begin ASA Continue Nifedipine - aspirin EC 81 MG tablet; Take 2 tablets (162 mg total) by mouth daily. Take after 12 weeks for prevention of preeclampsia later in pregnancy  Dispense: 300 tablet; Refill: 2 - NIFEdipine (PROCARDIA-XL/NIFEDICAL-XL) 30 MG 24 hr tablet; Take 1 tablet (30 mg total) by mouth daily. Can increase to twice a day as needed for symptomatic contractions  Dispense: 30 tablet; Refill: 6   General obstetric precautions including but not limited to vaginal bleeding, contractions, leaking of fluid and fetal movement were reviewed in detail with the patient. Please refer to After Visit Summary for other counseling recommendations.   Return in 2 weeks (on 07/02/2023).  Future Appointments  Date Time Provider Department Center  07/06/2023  1:15 PM [facility] NURSE [facility]  07/06/2023  1:30 PM [facility] US4 [facility]  07/15/2023  3:55 PM Federico Flake, MD [facility]  08/12/2023  3:55 PM Tonica Bing, MD [facility]    Reva Bores, MD

## 2023-07-06 ENCOUNTER — Ambulatory Visit: Payer: Medicaid Other | Attending: Obstetrics and Gynecology

## 2023-07-06 ENCOUNTER — Ambulatory Visit: Payer: Medicaid Other | Admitting: *Deleted

## 2023-07-06 ENCOUNTER — Ambulatory Visit: Attending: Obstetrics and Gynecology | Admitting: Obstetrics and Gynecology

## 2023-07-06 ENCOUNTER — Other Ambulatory Visit: Payer: Self-pay

## 2023-07-06 VITALS — BP 140/66 | HR 105

## 2023-07-06 VITALS — BP 135/82 | HR 101

## 2023-07-06 DIAGNOSIS — O0990 Supervision of high risk pregnancy, unspecified, unspecified trimester: Secondary | ICD-10-CM | POA: Insufficient documentation

## 2023-07-06 DIAGNOSIS — E669 Obesity, unspecified: Secondary | ICD-10-CM

## 2023-07-06 DIAGNOSIS — O99810 Abnormal glucose complicating pregnancy: Secondary | ICD-10-CM | POA: Diagnosis not present

## 2023-07-06 DIAGNOSIS — O10012 Pre-existing essential hypertension complicating pregnancy, second trimester: Secondary | ICD-10-CM

## 2023-07-06 DIAGNOSIS — O09212 Supervision of pregnancy with history of pre-term labor, second trimester: Secondary | ICD-10-CM

## 2023-07-06 DIAGNOSIS — Z3A24 24 weeks gestation of pregnancy: Secondary | ICD-10-CM

## 2023-07-06 DIAGNOSIS — O10919 Unspecified pre-existing hypertension complicating pregnancy, unspecified trimester: Secondary | ICD-10-CM | POA: Diagnosis present

## 2023-07-06 DIAGNOSIS — O360120 Maternal care for anti-D [Rh] antibodies, second trimester, not applicable or unspecified: Secondary | ICD-10-CM

## 2023-07-06 DIAGNOSIS — O99212 Obesity complicating pregnancy, second trimester: Secondary | ICD-10-CM

## 2023-07-06 DIAGNOSIS — O99012 Anemia complicating pregnancy, second trimester: Secondary | ICD-10-CM

## 2023-07-06 DIAGNOSIS — D563 Thalassemia minor: Secondary | ICD-10-CM

## 2023-07-06 DIAGNOSIS — O09292 Supervision of pregnancy with other poor reproductive or obstetric history, second trimester: Secondary | ICD-10-CM

## 2023-07-06 NOTE — Progress Notes (Signed)
 Maternal-Fetal Medicine Consultation I had the pleasure of seeing Ms. Lardner today at the [facility]. She is GG3 P2002 at 68-weeks' gestation and is here for completion of fetal anatomy.  On cell-free fetal DNA screening, the risks of fetal aneuploidies are not increased.  Patient has chronic hypertension, and she was prescribed nifedipine at your office.  She has not started taking nifedipine regularly. Her hemoglobin A1c was 5.9% and the patient will be undergoing screening for gestational diabetes. Blood pressure today at our office is 140/66 mmHg.  Patient does not have signs and symptoms of severe features of preeclampsia.   Ultrasound Fetal growth is appropriate for gestational age.  Amniotic fluid is normal and good fetal activity seen.  Fetal anatomical survey was completed and appears normal.  Mild hypertension Till recently, following guidelines from [employer] of Obstetricians Gynecologists, mildly chronic hypertension as defined by systolic blood pressure of >=140 mm Hg and <160 mm Hg or diastolic BP of >=90 mm Hg and <454 mm Hg or both, was not treated with antihypertensives.  Chronic hypertension in pregnancy (CHAP) recruited 2408 patients with chronic mild hypertension, of whom 1208 were randomly assigned to the active treatment group and 1200 were assigned to the standard treatment group.  The study concluded that treatment of mild hypertension is associated with significant reductions in preeclampsia with severe features, severe hypertension, medically indicated preterm birth and low birthweights.  I encouraged the patient to take antihypertensives regularly. Patient agreed to take the nifedipine.  Recommendations -An appointment was made for her to return in 4 weeks for fetal growth assessment. -Weekly BPP from [redacted] weeks gestation till delivery.  Consultation including face-to-face (more than 50%) counseling 10 minutes.

## 2023-07-07 ENCOUNTER — Other Ambulatory Visit: Payer: Self-pay

## 2023-07-07 DIAGNOSIS — R7303 Prediabetes: Secondary | ICD-10-CM

## 2023-07-07 DIAGNOSIS — O10919 Unspecified pre-existing hypertension complicating pregnancy, unspecified trimester: Secondary | ICD-10-CM

## 2023-07-07 DIAGNOSIS — O99210 Obesity complicating pregnancy, unspecified trimester: Secondary | ICD-10-CM

## 2023-07-15 ENCOUNTER — Ambulatory Visit: Payer: Medicaid Other | Admitting: Family Medicine

## 2023-07-15 VITALS — BP 125/79 | HR 95 | Wt 237.9 lb

## 2023-07-15 DIAGNOSIS — O99212 Obesity complicating pregnancy, second trimester: Secondary | ICD-10-CM | POA: Diagnosis not present

## 2023-07-15 DIAGNOSIS — O10919 Unspecified pre-existing hypertension complicating pregnancy, unspecified trimester: Secondary | ICD-10-CM

## 2023-07-15 DIAGNOSIS — O26892 Other specified pregnancy related conditions, second trimester: Secondary | ICD-10-CM

## 2023-07-15 DIAGNOSIS — O0992 Supervision of high risk pregnancy, unspecified, second trimester: Secondary | ICD-10-CM

## 2023-07-15 DIAGNOSIS — Z8632 Personal history of gestational diabetes: Secondary | ICD-10-CM

## 2023-07-15 DIAGNOSIS — O26899 Other specified pregnancy related conditions, unspecified trimester: Secondary | ICD-10-CM

## 2023-07-15 DIAGNOSIS — O10912 Unspecified pre-existing hypertension complicating pregnancy, second trimester: Secondary | ICD-10-CM

## 2023-07-15 DIAGNOSIS — Z3A25 25 weeks gestation of pregnancy: Secondary | ICD-10-CM

## 2023-07-15 DIAGNOSIS — Z8759 Personal history of other complications of pregnancy, childbirth and the puerperium: Secondary | ICD-10-CM

## 2023-07-15 DIAGNOSIS — O0990 Supervision of high risk pregnancy, unspecified, unspecified trimester: Secondary | ICD-10-CM

## 2023-07-15 DIAGNOSIS — Z6791 Unspecified blood type, Rh negative: Secondary | ICD-10-CM

## 2023-07-15 DIAGNOSIS — Z8719 Personal history of other diseases of the digestive system: Secondary | ICD-10-CM

## 2023-07-15 DIAGNOSIS — O99210 Obesity complicating pregnancy, unspecified trimester: Secondary | ICD-10-CM

## 2023-07-15 NOTE — Progress Notes (Signed)
   PRENATAL VISIT NOTE  Subjective:  Amanda Ellison is a 33 y.o. G3P2002 at [redacted]w[redacted]d being seen today for ongoing prenatal care.  She is currently monitored for the following issues for this high-risk pregnancy and has Rh negative, antepartum; History of gestational diabetes; History of cholestasis during pregnancy; Prediabetes; Class 1 obesity due to excess calories with serious comorbidity in adult; Chronic hypertension affecting pregnancy; Supervision of high risk pregnancy, antepartum; Subchorionic hemorrhage of placenta in first trimester; Obesity affecting pregnancy, antepartum; Elevated hemoglobin A1c; and Alpha thalassemia silent carrier on their problem list.  Patient reports no complaints.  Contractions: Not present. Vag. Bleeding: None.  Movement: Present. Denies leaking of fluid.   The following portions of the patient's history were reviewed and updated as appropriate: allergies, current medications, past family history, past medical history, past social history, past surgical history and problem list.   Objective:   Vitals:   07/15/23 1553  BP: 125/79  Pulse: 95  Weight: 237 lb 14.4 oz (107.9 kg)    Fetal Status: Fetal Heart Rate (bpm): 157 Fundal Height: 26 cm Movement: Present     General:  Alert, oriented and cooperative. Patient is in no acute distress.  Skin: Skin is warm and dry. No rash noted.   Cardiovascular: Normal heart rate noted  Respiratory: Normal respiratory effort, no problems with respiration noted  Abdomen: Soft, gravid, appropriate for gestational age.  Pain/Pressure: Absent     Pelvic: Cervical exam deferred        Extremities: Normal range of motion.  Edema: None  Mental Status: Normal mood and affect. Normal behavior. Normal judgment and thought content.   Assessment and Plan:  Pregnancy: G3P2002 at [redacted]w[redacted]d 1. Supervision of high risk pregnancy, antepartum (Primary) Up to date FH appropriate  No concerns today Reviewed upcoming appts and  testing  2. Rh negative, antepartum Rhogam at next visit  3. Chronic hypertension affecting pregnancy BP wnl  4. Obesity affecting pregnancy, antepartum, unspecified obesity type TWG=14 lb 14.4 oz (6.759 kg)   5. History of gestational diabetes Fasting 95-150 PP 82-117 Has been checking QID. Offered to continue checking sugars vs  2hr GTT. Desires to do GTT     6. History of cholestasis during pregnancy No itching  Preterm labor symptoms and general obstetric precautions including but not limited to vaginal bleeding, contractions, leaking of fluid and fetal movement were reviewed in detail with the patient. Please refer to After Visit Summary for other counseling recommendations.   Return in about 4 weeks (around 08/12/2023) for Routine prenatal care.  Future Appointments  Date Time Provider Department Center  08/03/2023  2:30 PM [facility] US5 [facility]  08/12/2023  8:15 AM  Bing, MD [facility]  08/12/2023  8:50 AM [facility]    Federico Flake, MD

## 2023-08-03 ENCOUNTER — Ambulatory Visit: Attending: Obstetrics and Gynecology

## 2023-08-03 ENCOUNTER — Other Ambulatory Visit: Payer: Self-pay | Admitting: *Deleted

## 2023-08-03 DIAGNOSIS — O99810 Abnormal glucose complicating pregnancy: Secondary | ICD-10-CM | POA: Diagnosis not present

## 2023-08-03 DIAGNOSIS — O360190 Maternal care for anti-D [Rh] antibodies, unspecified trimester, not applicable or unspecified: Secondary | ICD-10-CM

## 2023-08-03 DIAGNOSIS — Z3A28 28 weeks gestation of pregnancy: Secondary | ICD-10-CM

## 2023-08-03 DIAGNOSIS — E669 Obesity, unspecified: Secondary | ICD-10-CM

## 2023-08-03 DIAGNOSIS — O99213 Obesity complicating pregnancy, third trimester: Secondary | ICD-10-CM

## 2023-08-03 DIAGNOSIS — O10913 Unspecified pre-existing hypertension complicating pregnancy, third trimester: Secondary | ICD-10-CM

## 2023-08-03 DIAGNOSIS — R7303 Prediabetes: Secondary | ICD-10-CM | POA: Diagnosis present

## 2023-08-03 DIAGNOSIS — O10919 Unspecified pre-existing hypertension complicating pregnancy, unspecified trimester: Secondary | ICD-10-CM | POA: Insufficient documentation

## 2023-08-03 DIAGNOSIS — O99210 Obesity complicating pregnancy, unspecified trimester: Secondary | ICD-10-CM | POA: Diagnosis present

## 2023-08-03 DIAGNOSIS — O10013 Pre-existing essential hypertension complicating pregnancy, third trimester: Secondary | ICD-10-CM | POA: Diagnosis not present

## 2023-08-12 ENCOUNTER — Encounter: Payer: Medicaid Other | Admitting: Obstetrics and Gynecology

## 2023-08-12 ENCOUNTER — Encounter: Payer: Self-pay | Admitting: Obstetrics and Gynecology

## 2023-08-12 ENCOUNTER — Other Ambulatory Visit: Payer: Self-pay

## 2023-08-12 ENCOUNTER — Other Ambulatory Visit

## 2023-08-12 ENCOUNTER — Ambulatory Visit: Admitting: Obstetrics and Gynecology

## 2023-08-12 VITALS — BP 122/75 | HR 87 | Wt 238.2 lb

## 2023-08-12 DIAGNOSIS — R7303 Prediabetes: Secondary | ICD-10-CM

## 2023-08-12 DIAGNOSIS — D563 Thalassemia minor: Secondary | ICD-10-CM

## 2023-08-12 DIAGNOSIS — Z8632 Personal history of gestational diabetes: Secondary | ICD-10-CM

## 2023-08-12 DIAGNOSIS — Z8719 Personal history of other diseases of the digestive system: Secondary | ICD-10-CM

## 2023-08-12 DIAGNOSIS — O10919 Unspecified pre-existing hypertension complicating pregnancy, unspecified trimester: Secondary | ICD-10-CM

## 2023-08-12 DIAGNOSIS — O3660X0 Maternal care for excessive fetal growth, unspecified trimester, not applicable or unspecified: Secondary | ICD-10-CM | POA: Insufficient documentation

## 2023-08-12 DIAGNOSIS — O99213 Obesity complicating pregnancy, third trimester: Secondary | ICD-10-CM

## 2023-08-12 DIAGNOSIS — O26893 Other specified pregnancy related conditions, third trimester: Secondary | ICD-10-CM | POA: Diagnosis not present

## 2023-08-12 DIAGNOSIS — O26899 Other specified pregnancy related conditions, unspecified trimester: Secondary | ICD-10-CM

## 2023-08-12 DIAGNOSIS — O10913 Unspecified pre-existing hypertension complicating pregnancy, third trimester: Secondary | ICD-10-CM

## 2023-08-12 DIAGNOSIS — R7309 Other abnormal glucose: Secondary | ICD-10-CM

## 2023-08-12 DIAGNOSIS — Z6791 Unspecified blood type, Rh negative: Secondary | ICD-10-CM | POA: Diagnosis not present

## 2023-08-12 DIAGNOSIS — Z6834 Body mass index (BMI) 34.0-34.9, adult: Secondary | ICD-10-CM

## 2023-08-12 DIAGNOSIS — Z23 Encounter for immunization: Secondary | ICD-10-CM | POA: Diagnosis not present

## 2023-08-12 DIAGNOSIS — O3663X0 Maternal care for excessive fetal growth, third trimester, not applicable or unspecified: Secondary | ICD-10-CM

## 2023-08-12 DIAGNOSIS — O99210 Obesity complicating pregnancy, unspecified trimester: Secondary | ICD-10-CM

## 2023-08-12 DIAGNOSIS — Z3A29 29 weeks gestation of pregnancy: Secondary | ICD-10-CM

## 2023-08-12 MED ORDER — NIFEDIPINE ER OSMOTIC RELEASE 30 MG PO TB24
30.0000 mg | ORAL_TABLET | Freq: Every day | ORAL | Status: DC
Start: 2023-08-12 — End: 2023-09-20

## 2023-08-12 MED ORDER — RHO D IMMUNE GLOBULIN 1500 UNIT/2ML IJ SOSY
300.0000 ug | PREFILLED_SYRINGE | Freq: Once | INTRAMUSCULAR | Status: AC
Start: 2023-08-12 — End: 2023-08-12
  Administered 2023-08-12: 300 ug via INTRAMUSCULAR

## 2023-08-12 NOTE — Progress Notes (Signed)
 PRENATAL VISIT NOTE  Subjective:  Amanda Ellison is a 33 y.o. G3P2002 at [redacted]w[redacted]d being seen today for ongoing prenatal care.  She is currently monitored for the following issues for this high-risk pregnancy and has Rh negative, antepartum; History of gestational diabetes; History of cholestasis during pregnancy; Prediabetes; Class 1 obesity due to excess calories with serious comorbidity in adult; Chronic hypertension affecting pregnancy; Supervision of high risk pregnancy, antepartum; Obesity affecting pregnancy, antepartum; Elevated hemoglobin A1c; Alpha thalassemia silent carrier; and LGA (large for gestational age) fetus affecting management of mother on their problem list.  Patient reports no complaints.  Contractions: Not present. Vag. Bleeding: None.  Movement: Present. Denies leaking of fluid.   The following portions of the patient's history were reviewed and updated as appropriate: allergies, current medications, past family history, past medical history, past social history, past surgical history and problem list.   Objective:   Vitals:   08/12/23 0827  BP: 122/75  Pulse: 87  Weight: 238 lb 4 oz (108.1 kg)    Fetal Status: Fetal Heart Rate (bpm): 140   Movement: Present     General:  Alert, oriented and cooperative. Patient is in no acute distress.  Skin: Skin is warm and dry. No rash noted.   Cardiovascular: Normal heart rate noted  Respiratory: Normal respiratory effort, no problems with respiration noted  Abdomen: Soft, gravid, appropriate for gestational age.  Pain/Pressure: Absent     Pelvic: Cervical exam deferred        Extremities: Normal range of motion.  Edema: None  Mental Status: Normal mood and affect. Normal behavior. Normal judgment and thought content.   Assessment and Plan:  Pregnancy: G3P2002 at [redacted]w[redacted]d 1. Chronic hypertension affecting pregnancy Continue on procardia xl 30 qday and low dose asa 3/31: 1472g, 94%, ac 97%, afi 22  2. [redacted] weeks  gestation of pregnancy (Primary) 28wk labs today. BC d/w pt and doesn't want BTL but does want something. Options d/w her and can follow up at subsequent visits  3. Rh negative, antepartum Rhogam and ab screen today  4. Obesity affecting pregnancy, antepartum, unspecified obesity type Weight stable  5. BMI 34.0-34.9,adult  6. Excessive fetal growth affecting management of pregnancy in third trimester, single or unspecified fetus See above. F/u 2h today  7. History of gestational diabetes  8. History of cholestasis during pregnancy  9. Elevated hemoglobin A1c Didn't do early GTT and checking CBGs at home and normal AM fasting an 2h post prandials. She forgot her log and states she has occasional elevated post dinner ones. Follow up 2h GTT today  10. Alpha thalassemia silent carrier  Preterm labor symptoms and general obstetric precautions including but not limited to vaginal bleeding, contractions, leaking of fluid and fetal movement were reviewed in detail with the patient. Please refer to After Visit Summary for other counseling recommendations.   No follow-ups on file.  Future Appointments  Date Time Provider Department Center  08/12/2023  8:50 AM [facility]  08/31/2023  2:00 PM [facility] PROVIDER 1 [facility]  08/31/2023  2:30 PM [facility] US5 [facility]  09/07/2023  2:00 PM [facility] PROVIDER 1 [facility]  09/07/2023  2:30 PM [facility] US1 [facility]  09/14/2023  2:00 PM [facility] PROVIDER 1 [facility]  09/14/2023  2:30 PM [facility] US3 [facility]  09/21/2023  2:00 PM [facility] PROVIDER 1 [facility]  09/21/2023  2:30 PM [facility] US1 [facility]  09/29/2023  2:00 PM [facility] PROVIDER 1 [facility]  09/29/2023  2:30 PM [facility] US3 [facility]    Marceline Bing, MD

## 2023-08-13 LAB — CBC
Hematocrit: 36.5 % (ref 34.0–46.6)
Hemoglobin: 11.2 g/dL (ref 11.1–15.9)
MCH: 25 pg — ABNORMAL LOW (ref 26.6–33.0)
MCHC: 30.7 g/dL — ABNORMAL LOW (ref 31.5–35.7)
MCV: 82 fL (ref 79–97)
Platelets: 306 10*3/uL (ref 150–450)
RBC: 4.48 x10E6/uL (ref 3.77–5.28)
RDW: 14.2 % (ref 11.7–15.4)
WBC: 6.9 10*3/uL (ref 3.4–10.8)

## 2023-08-13 LAB — GLUCOSE TOLERANCE, 2 HOURS W/ 1HR
Glucose, 1 hour: 254 mg/dL — ABNORMAL HIGH (ref 70–179)
Glucose, 2 hour: 138 mg/dL (ref 70–152)
Glucose, Fasting: 110 mg/dL — ABNORMAL HIGH (ref 70–91)

## 2023-08-13 LAB — HIV ANTIBODY (ROUTINE TESTING W REFLEX): HIV Screen 4th Generation wRfx: NONREACTIVE

## 2023-08-13 LAB — RPR: RPR Ser Ql: NONREACTIVE

## 2023-08-13 LAB — ANTIBODY SCREEN: Antibody Screen: NEGATIVE

## 2023-08-19 ENCOUNTER — Telehealth: Payer: Self-pay | Admitting: *Deleted

## 2023-08-19 DIAGNOSIS — O24419 Gestational diabetes mellitus in pregnancy, unspecified control: Secondary | ICD-10-CM

## 2023-08-19 NOTE — Telephone Encounter (Signed)
 I called Amanda Ellison and heard a message " voicemail not set up." I schedule diabetes education for 08/25/23. I placed referrral order. Per chart review had GDM diagnosed after initial elevated hemoglobin A1c in November. She already had meter and supplies sent to her pharmacy. May not have seen diabetes educator checked cbg's. Need to discuss with patient and order supplies as needed.  Amanda Ellison

## 2023-08-19 NOTE — Telephone Encounter (Signed)
-----   Message from Amanda Ellison sent at 08/14/2023 12:51 PM EDT ----- Labs shows GDM. Please call patient to discuss and order supplies/DM education

## 2023-08-20 NOTE — Telephone Encounter (Signed)
 Called patient and informed her of GDM. She reports she has picked up the blood glucose testing supplies. Reviewed with her to bring to her Diabetes Education appointment on 4/22 at 9:15 at the [facility]. Patient voiced understanding.

## 2023-08-25 ENCOUNTER — Ambulatory Visit (INDEPENDENT_AMBULATORY_CARE_PROVIDER_SITE_OTHER): Admitting: Dietician

## 2023-08-25 ENCOUNTER — Encounter: Attending: Family Medicine | Admitting: Dietician

## 2023-08-25 ENCOUNTER — Other Ambulatory Visit: Payer: Self-pay

## 2023-08-25 DIAGNOSIS — O3663X0 Maternal care for excessive fetal growth, third trimester, not applicable or unspecified: Secondary | ICD-10-CM | POA: Diagnosis not present

## 2023-08-25 DIAGNOSIS — Z3A31 31 weeks gestation of pregnancy: Secondary | ICD-10-CM

## 2023-08-25 DIAGNOSIS — O24410 Gestational diabetes mellitus in pregnancy, diet controlled: Secondary | ICD-10-CM | POA: Insufficient documentation

## 2023-08-25 DIAGNOSIS — O10913 Unspecified pre-existing hypertension complicating pregnancy, third trimester: Secondary | ICD-10-CM | POA: Insufficient documentation

## 2023-08-25 DIAGNOSIS — O99213 Obesity complicating pregnancy, third trimester: Secondary | ICD-10-CM | POA: Insufficient documentation

## 2023-08-25 DIAGNOSIS — O24419 Gestational diabetes mellitus in pregnancy, unspecified control: Secondary | ICD-10-CM

## 2023-08-25 DIAGNOSIS — Z3A32 32 weeks gestation of pregnancy: Secondary | ICD-10-CM | POA: Insufficient documentation

## 2023-08-25 NOTE — Progress Notes (Signed)
 Patient was seen for Gestational Diabetes on 08/25/23  Start time 4401 and End time 1012   Estimated due date: 10/25/2023; [redacted]w[redacted]d  Clinical: Medications: prenatal vitamin Medical History: GDM history Labs: OGTT fasting 110 08/12/23, A1c 5.9% on 03/29/24  Dietary and Lifestyle History: Patient lives with her husband 2 children.  She does the shopping and cooking.  She is not employed at this time.  Physical Activity: She is walking daily for 20 minutes in the am and 30 minutes at night. Stress: none Sleep: fair  24 hr Recall:  First Meal:  rice, banana Snack:  spaghetti with fish sauce Second meal:  potato, tomatoes, onions Snack:  none Third meal:  banana, apple Snack:  none Beverages: water, tea with canned milk, 1/2 tsp sugar   NUTRITION INTERVENTION  Nutrition education (E-1) on the following topics:   Initial Follow-up  [x]  []  Definition of Gestational Diabetes [x]  []  Why dietary management is important in controlling blood glucose [x]  []  Effects each nutrient has on blood glucose levels [x]  []  Simple carbohydrates vs complex carbohydrates [x]  []  Fluid intake [x]  []  Creating a balanced meal plan []  []  Carbohydrate counting  [x]  []  When to check blood glucose levels [x]  []  Proper blood glucose monitoring techniques [x]  []  Effect of stress and stress reduction techniques  [x]  []  Exercise effect on blood glucose levels, appropriate exercise during pregnancy []  []  Importance of limiting caffeine and abstaining from alcohol and smoking [x]  []  Medications used for blood sugar control during pregnancy [x]  []  Hypoglycemia and rule of 15 [x]  []  Postpartum self care  Patient has a meter prior to visit. Patient is  testing pre breakfast and 2 hours after each meal. FBS: 80-90  Patient instructed to monitor glucose levels: FBS: 60 - <= 95 mg/dL; 2 hour: <= 027 mg/dL  Patient received handouts: Nutrition Diabetes and Pregnancy Carbohydrate Counting List Blood glucose  log Snack ideas for diabetes during pregnancy  Patient will be seen for follow-up as needed.

## 2023-08-31 ENCOUNTER — Ambulatory Visit: Attending: Obstetrics | Admitting: Obstetrics

## 2023-08-31 ENCOUNTER — Ambulatory Visit (HOSPITAL_BASED_OUTPATIENT_CLINIC_OR_DEPARTMENT_OTHER)

## 2023-08-31 DIAGNOSIS — Z148 Genetic carrier of other disease: Secondary | ICD-10-CM | POA: Diagnosis not present

## 2023-08-31 DIAGNOSIS — O24410 Gestational diabetes mellitus in pregnancy, diet controlled: Secondary | ICD-10-CM | POA: Diagnosis not present

## 2023-08-31 DIAGNOSIS — O99213 Obesity complicating pregnancy, third trimester: Secondary | ICD-10-CM | POA: Insufficient documentation

## 2023-08-31 DIAGNOSIS — O10913 Unspecified pre-existing hypertension complicating pregnancy, third trimester: Secondary | ICD-10-CM

## 2023-08-31 DIAGNOSIS — Z3A32 32 weeks gestation of pregnancy: Secondary | ICD-10-CM | POA: Diagnosis not present

## 2023-08-31 DIAGNOSIS — E669 Obesity, unspecified: Secondary | ICD-10-CM

## 2023-08-31 DIAGNOSIS — O403XX0 Polyhydramnios, third trimester, not applicable or unspecified: Secondary | ICD-10-CM | POA: Insufficient documentation

## 2023-08-31 DIAGNOSIS — O10013 Pre-existing essential hypertension complicating pregnancy, third trimester: Secondary | ICD-10-CM

## 2023-08-31 DIAGNOSIS — O360130 Maternal care for anti-D [Rh] antibodies, third trimester, not applicable or unspecified: Secondary | ICD-10-CM | POA: Diagnosis not present

## 2023-08-31 DIAGNOSIS — O99210 Obesity complicating pregnancy, unspecified trimester: Secondary | ICD-10-CM

## 2023-08-31 DIAGNOSIS — Z362 Encounter for other antenatal screening follow-up: Secondary | ICD-10-CM | POA: Insufficient documentation

## 2023-08-31 DIAGNOSIS — O3663X0 Maternal care for excessive fetal growth, third trimester, not applicable or unspecified: Secondary | ICD-10-CM | POA: Diagnosis not present

## 2023-08-31 NOTE — Progress Notes (Signed)
 MFM Consult Note  Amanda Ellison is currently at 32 weeks and 1 day.  She was seen was seen due to chronic hypertension treated with nifedipine .  She was recently diagnosed with diet-controlled gestational diabetes.    On today's exam, the overall EFW of 5 pounds 5 ounces measures at the 95th percentile for her gestational age.    Mild polyhydramnios with a total AFI of 26.25 cm was noted.  A BPP performed today was 8 out of 8.  She was advised to continue monitoring her fingersticks 4 times a day (fasting and 2 hours after each meal).  She was advised that our goals for her fingerstick values are fasting values of 90-95 or less and two-hour postprandials of 120 or less.    Should the majority of her fingerstick values be above these values, she may have to be started on insulin or metformin to help her achieve better glycemic control.   Due to chronic hypertension treated with nifedipine  and gestational diabetes, we will continue to follow her with weekly fetal testing until delivery.  Should polyhydramnios and a large for gestational age fetus continue to be noted later in her pregnancy, delivery may be considered at between 67 to 38 weeks.    She will return in 1 week for another BPP.    The patient stated that all of her questions were answered today.  A total of 20 minutes was spent counseling and coordinating the care for this patient.  Greater than 50% of the time was spent in direct face-to-face contact.

## 2023-09-03 ENCOUNTER — Ambulatory Visit: Admitting: Family Medicine

## 2023-09-03 VITALS — BP 136/79 | HR 86 | Wt 235.0 lb

## 2023-09-03 DIAGNOSIS — O24410 Gestational diabetes mellitus in pregnancy, diet controlled: Secondary | ICD-10-CM

## 2023-09-03 DIAGNOSIS — D563 Thalassemia minor: Secondary | ICD-10-CM

## 2023-09-03 DIAGNOSIS — O26893 Other specified pregnancy related conditions, third trimester: Secondary | ICD-10-CM

## 2023-09-03 DIAGNOSIS — O99213 Obesity complicating pregnancy, third trimester: Secondary | ICD-10-CM

## 2023-09-03 DIAGNOSIS — O0993 Supervision of high risk pregnancy, unspecified, third trimester: Secondary | ICD-10-CM | POA: Diagnosis not present

## 2023-09-03 DIAGNOSIS — Z3A32 32 weeks gestation of pregnancy: Secondary | ICD-10-CM

## 2023-09-03 DIAGNOSIS — Z6791 Unspecified blood type, Rh negative: Secondary | ICD-10-CM

## 2023-09-03 DIAGNOSIS — O3663X0 Maternal care for excessive fetal growth, third trimester, not applicable or unspecified: Secondary | ICD-10-CM

## 2023-09-03 DIAGNOSIS — O99210 Obesity complicating pregnancy, unspecified trimester: Secondary | ICD-10-CM

## 2023-09-03 DIAGNOSIS — O10913 Unspecified pre-existing hypertension complicating pregnancy, third trimester: Secondary | ICD-10-CM | POA: Diagnosis not present

## 2023-09-03 DIAGNOSIS — O10919 Unspecified pre-existing hypertension complicating pregnancy, unspecified trimester: Secondary | ICD-10-CM

## 2023-09-03 DIAGNOSIS — O0990 Supervision of high risk pregnancy, unspecified, unspecified trimester: Secondary | ICD-10-CM

## 2023-09-03 NOTE — Progress Notes (Signed)
   PRENATAL VISIT NOTE  Subjective:  Amanda Ellison is a 33 y.o. G3P2002 at [redacted]w[redacted]d being seen today for ongoing prenatal care.  She is currently monitored for the following issues for this high-risk pregnancy and has Rh negative, antepartum; Gestational diabetes mellitus; History of gestational diabetes; History of cholestasis during pregnancy; Prediabetes; Class 1 obesity due to excess calories with serious comorbidity in adult; Chronic hypertension affecting pregnancy; Supervision of high risk pregnancy, antepartum; Obesity affecting pregnancy, antepartum; Elevated hemoglobin A1c; Alpha thalassemia silent carrier; and LGA (large for gestational age) fetus affecting management of mother on their problem list.  Patient reports no complaints.  Contractions: Not present. Vag. Bleeding: None.  Movement: Present. Denies leaking of fluid.   The following portions of the patient's history were reviewed and updated as appropriate: allergies, current medications, past family history, past medical history, past social history, past surgical history and problem list.   Objective:   Vitals:   09/03/23 1109  BP: 136/79  Pulse: 86  Weight: 235 lb (106.6 kg)    Fetal Status: Fetal Heart Rate (bpm): 150 Fundal Height: 38 cm Movement: Present     General:  Alert, oriented and cooperative. Patient is in no acute distress.  Skin: Skin is warm and dry. No rash noted.   Cardiovascular: Normal heart rate noted  Respiratory: Normal respiratory effort, no problems with respiration noted  Abdomen: Soft, gravid, appropriate for gestational age.  Pain/Pressure: Absent     Pelvic: Cervical exam deferred        Extremities: Normal range of motion.  Edema: None  Mental Status: Normal mood and affect. Normal behavior. Normal judgment and thought content.   Assessment and Plan:  Pregnancy: G3P2002 at [redacted]w[redacted]d 1. [redacted] weeks gestation of pregnancy (Primary)   2. Supervision of high risk pregnancy,  antepartum Continue prenatal care.  3. Chronic hypertension affecting pregnancy BP is well controlled on Nifedipine  ASA  4. Diet controlled gestational diabetes mellitus (GDM) in third trimester No log EFW is > 95% + with poly  5. Rh negative, antepartum S/p Rhogam  6. Excessive fetal growth affecting management of pregnancy in third trimester, single or unspecified fetus Due to GDM  7. Obesity affecting pregnancy, antepartum, unspecified obesity type   8. Alpha thalassemia silent carrier   Preterm labor symptoms and general obstetric precautions including but not limited to vaginal bleeding, contractions, leaking of fluid and fetal movement were reviewed in detail with the patient. Please refer to After Visit Summary for other counseling recommendations.   No follow-ups on file.  Future Appointments  Date Time Provider Department Center  09/07/2023  2:00 PM [facility] PROVIDER 1 [facility]  09/07/2023  2:30 PM [facility] US1 [facility]  09/14/2023  2:00 PM [facility] PROVIDER 1 [facility]  09/14/2023  2:30 PM [facility] US3 [facility]  09/21/2023  2:00 PM [facility] PROVIDER 1 [facility]  09/21/2023  2:30 PM [facility] US1 [facility]  09/22/2023  2:35 PM Jan Mcgill, MD [facility]  09/29/2023  2:00 PM [facility] PROVIDER 1 [facility]  09/29/2023  2:30 PM [facility] US3 [facility]    Granville Layer, MD

## 2023-09-07 ENCOUNTER — Ambulatory Visit: Attending: Obstetrics | Admitting: Maternal & Fetal Medicine

## 2023-09-07 ENCOUNTER — Ambulatory Visit (HOSPITAL_BASED_OUTPATIENT_CLINIC_OR_DEPARTMENT_OTHER)

## 2023-09-07 DIAGNOSIS — O24410 Gestational diabetes mellitus in pregnancy, diet controlled: Secondary | ICD-10-CM | POA: Diagnosis present

## 2023-09-07 DIAGNOSIS — Z148 Genetic carrier of other disease: Secondary | ICD-10-CM | POA: Insufficient documentation

## 2023-09-07 DIAGNOSIS — O403XX0 Polyhydramnios, third trimester, not applicable or unspecified: Secondary | ICD-10-CM | POA: Insufficient documentation

## 2023-09-07 DIAGNOSIS — O99213 Obesity complicating pregnancy, third trimester: Secondary | ICD-10-CM | POA: Diagnosis not present

## 2023-09-07 DIAGNOSIS — O10013 Pre-existing essential hypertension complicating pregnancy, third trimester: Secondary | ICD-10-CM | POA: Diagnosis not present

## 2023-09-07 DIAGNOSIS — O99210 Obesity complicating pregnancy, unspecified trimester: Secondary | ICD-10-CM

## 2023-09-07 DIAGNOSIS — O360130 Maternal care for anti-D [Rh] antibodies, third trimester, not applicable or unspecified: Secondary | ICD-10-CM | POA: Diagnosis not present

## 2023-09-07 DIAGNOSIS — Z362 Encounter for other antenatal screening follow-up: Secondary | ICD-10-CM | POA: Diagnosis not present

## 2023-09-07 DIAGNOSIS — O10913 Unspecified pre-existing hypertension complicating pregnancy, third trimester: Secondary | ICD-10-CM

## 2023-09-07 DIAGNOSIS — E669 Obesity, unspecified: Secondary | ICD-10-CM

## 2023-09-07 DIAGNOSIS — O3663X0 Maternal care for excessive fetal growth, third trimester, not applicable or unspecified: Secondary | ICD-10-CM | POA: Diagnosis not present

## 2023-09-07 DIAGNOSIS — Z3A33 33 weeks gestation of pregnancy: Secondary | ICD-10-CM | POA: Diagnosis not present

## 2023-09-07 NOTE — Progress Notes (Signed)
 After review, MFM consult with provider is not indicated for today  Penney Bowling, DO 09/07/2023 3:35 PM  [facility]

## 2023-09-07 NOTE — Progress Notes (Unsigned)
 After review, MFM consult with provider is not indicated for today  Penney Bowling, DO 09/07/2023 3:33 PM  [facility]

## 2023-09-14 ENCOUNTER — Ambulatory Visit: Attending: Obstetrics and Gynecology

## 2023-09-14 ENCOUNTER — Ambulatory Visit (HOSPITAL_BASED_OUTPATIENT_CLINIC_OR_DEPARTMENT_OTHER): Admitting: *Deleted

## 2023-09-14 VITALS — BP 137/68

## 2023-09-14 DIAGNOSIS — O10913 Unspecified pre-existing hypertension complicating pregnancy, third trimester: Secondary | ICD-10-CM

## 2023-09-14 DIAGNOSIS — Z3A34 34 weeks gestation of pregnancy: Secondary | ICD-10-CM | POA: Insufficient documentation

## 2023-09-14 DIAGNOSIS — O10013 Pre-existing essential hypertension complicating pregnancy, third trimester: Secondary | ICD-10-CM

## 2023-09-14 NOTE — Procedures (Signed)
 Amanda Ellison 1991-02-23 [redacted]w[redacted]d  Fetus A Non-Stress Test Interpretation for 09/14/23  NST only  Indication: Chronic Hypertenstion  Fetal Heart Rate A Mode: External Baseline Rate (A): 145 bpm Variability: Moderate Accelerations: 15 x 15 Decelerations: None Multiple birth?: No  Uterine Activity Mode: Palpation, Toco Contraction Frequency (min): none Resting Tone Palpated: Relaxed  Interpretation (Fetal Testing) Nonstress Test Interpretation: Reactive Overall Impression: Reassuring for gestational age Comments: Dr. Nolan Battle reviewed tracing

## 2023-09-18 ENCOUNTER — Encounter (HOSPITAL_COMMUNITY): Payer: Self-pay | Admitting: Obstetrics & Gynecology

## 2023-09-18 ENCOUNTER — Inpatient Hospital Stay (HOSPITAL_COMMUNITY)
Admission: AD | Admit: 2023-09-18 | Discharge: 2023-09-20 | Disposition: A | Discharge status: Home / Self Care | Attending: Obstetrics & Gynecology | Admitting: Obstetrics & Gynecology

## 2023-09-18 DIAGNOSIS — O26893 Other specified pregnancy related conditions, third trimester: Secondary | ICD-10-CM | POA: Diagnosis not present

## 2023-09-18 DIAGNOSIS — Z79899 Other long term (current) drug therapy: Secondary | ICD-10-CM | POA: Insufficient documentation

## 2023-09-18 DIAGNOSIS — O24410 Gestational diabetes mellitus in pregnancy, diet controlled: Secondary | ICD-10-CM | POA: Insufficient documentation

## 2023-09-18 DIAGNOSIS — O3663X0 Maternal care for excessive fetal growth, third trimester, not applicable or unspecified: Secondary | ICD-10-CM | POA: Diagnosis not present

## 2023-09-18 DIAGNOSIS — O99214 Obesity complicating childbirth: Secondary | ICD-10-CM | POA: Insufficient documentation

## 2023-09-18 DIAGNOSIS — M545 Low back pain, unspecified: Secondary | ICD-10-CM | POA: Insufficient documentation

## 2023-09-18 DIAGNOSIS — Z3A34 34 weeks gestation of pregnancy: Secondary | ICD-10-CM | POA: Diagnosis not present

## 2023-09-18 DIAGNOSIS — Z6791 Unspecified blood type, Rh negative: Secondary | ICD-10-CM | POA: Diagnosis not present

## 2023-09-18 DIAGNOSIS — O10913 Unspecified pre-existing hypertension complicating pregnancy, third trimester: Secondary | ICD-10-CM | POA: Diagnosis not present

## 2023-09-18 DIAGNOSIS — M549 Dorsalgia, unspecified: Secondary | ICD-10-CM | POA: Diagnosis present

## 2023-09-18 DIAGNOSIS — O10919 Unspecified pre-existing hypertension complicating pregnancy, unspecified trimester: Secondary | ICD-10-CM

## 2023-09-18 DIAGNOSIS — O479 False labor, unspecified: Secondary | ICD-10-CM

## 2023-09-18 LAB — URINALYSIS, ROUTINE W REFLEX MICROSCOPIC
Bilirubin Urine: NEGATIVE
Glucose, UA: NEGATIVE mg/dL
Hgb urine dipstick: NEGATIVE
Ketones, ur: 20 mg/dL — AB
Nitrite: NEGATIVE
Protein, ur: NEGATIVE mg/dL
Specific Gravity, Urine: 1.009 (ref 1.005–1.030)
pH: 7 (ref 5.0–8.0)

## 2023-09-18 LAB — PROTEIN / CREATININE RATIO, URINE
Creatinine, Urine: 54 mg/dL
Protein Creatinine Ratio: 0.17 mg/mg{creat} — ABNORMAL HIGH (ref 0.00–0.15)
Total Protein, Urine: 9 mg/dL

## 2023-09-18 LAB — TYPE AND SCREEN
ABO/RH(D): O NEG
Antibody Screen: POSITIVE

## 2023-09-18 LAB — WET PREP, GENITAL
Clue Cells Wet Prep HPF POC: NONE SEEN
Sperm: NONE SEEN
Trich, Wet Prep: NONE SEEN
WBC, Wet Prep HPF POC: >=10 — AB (ref <10)
Yeast Wet Prep HPF POC: NONE SEEN

## 2023-09-18 LAB — COMPREHENSIVE METABOLIC PANEL WITH GFR
ALT: 19 U/L (ref 0–44)
AST: 24 U/L (ref 15–41)
Albumin: 2.7 g/dL — ABNORMAL LOW (ref 3.5–5.0)
Alkaline Phosphatase: 107 U/L (ref 38–126)
Anion gap: 11 (ref 5–15)
BUN: 5 mg/dL — ABNORMAL LOW (ref 6–20)
CO2: 19 mmol/L — ABNORMAL LOW (ref 22–32)
Calcium: 9.6 mg/dL (ref 8.9–10.3)
Chloride: 104 mmol/L (ref 98–111)
Creatinine, Ser: 0.47 mg/dL (ref 0.44–1.00)
GFR, Estimated: >60 mL/min (ref >60)
Glucose, Bld: 110 mg/dL — ABNORMAL HIGH (ref 70–99)
Potassium: 4.2 mmol/L (ref 3.5–5.1)
Sodium: 134 mmol/L — ABNORMAL LOW (ref 135–145)
Total Bilirubin: 0.5 mg/dL (ref 0.0–1.2)
Total Protein: 6.2 g/dL — ABNORMAL LOW (ref 6.5–8.1)

## 2023-09-18 LAB — CBC
HCT: 30.9 % — ABNORMAL LOW (ref 36.0–46.0)
Hemoglobin: 9.9 g/dL — ABNORMAL LOW (ref 12.0–15.0)
MCH: 24.7 pg — ABNORMAL LOW (ref 26.0–34.0)
MCHC: 32 g/dL (ref 30.0–36.0)
MCV: 77.1 fL — ABNORMAL LOW (ref 80.0–100.0)
Platelets: 298 10*3/uL (ref 150–400)
RBC: 4.01 MIL/uL (ref 3.87–5.11)
RDW: 14.6 % (ref 11.5–15.5)
WBC: 7.7 10*3/uL (ref 4.0–10.5)
nRBC: 0 % (ref 0.0–0.2)

## 2023-09-18 MED ORDER — NIFEDIPINE 10 MG PO CAPS: 20.0000 mg | ORAL_CAPSULE | ORAL | Status: DC | PRN

## 2023-09-18 MED ORDER — NIFEDIPINE ER OSMOTIC RELEASE 30 MG PO TB24
30.0000 mg | ORAL_TABLET | Freq: Once | ORAL | Status: AC
  Administered 2023-09-18: 30 mg via ORAL
  Filled 2023-09-18: qty 1

## 2023-09-18 MED ORDER — CYCLOBENZAPRINE HCL 5 MG PO TABS
10.0000 mg | ORAL_TABLET | Freq: Once | ORAL | Status: AC
  Administered 2023-09-18: 10 mg via ORAL
  Filled 2023-09-18: qty 2

## 2023-09-18 MED ORDER — ZOLPIDEM TARTRATE 5 MG PO TABS: 5.0000 mg | ORAL_TABLET | Freq: Every evening | ORAL | Status: DC | PRN

## 2023-09-18 MED ORDER — ACETAMINOPHEN 500 MG PO TABS
1000.0000 mg | ORAL_TABLET | Freq: Once | ORAL | Status: AC
  Administered 2023-09-18: 1000 mg via ORAL
  Filled 2023-09-18: qty 2

## 2023-09-18 MED ORDER — LACTATED RINGERS IV SOLN: 125.0000 mL/h | INTRAVENOUS | Status: AC

## 2023-09-18 MED ORDER — LABETALOL HCL 5 MG/ML IV SOLN: 40.0000 mg | INTRAVENOUS | Status: DC | PRN

## 2023-09-18 MED ORDER — PRENATAL MULTIVITAMIN CH
1.0000 | ORAL_TABLET | Freq: Every day | ORAL | Status: DC
  Administered 2023-09-19: 1 via ORAL
  Filled 2023-09-18: qty 1

## 2023-09-18 MED ORDER — NIFEDIPINE 10 MG PO CAPS
10.0000 mg | ORAL_CAPSULE | ORAL | Status: DC | PRN
  Administered 2023-09-18: 10 mg via ORAL
  Filled 2023-09-18: qty 1

## 2023-09-18 MED ORDER — NIFEDIPINE ER OSMOTIC RELEASE 30 MG PO TB24
30.0000 mg | ORAL_TABLET | Freq: Two times a day (BID) | ORAL | Status: DC
  Administered 2023-09-19 (×2): 30 mg via ORAL
  Filled 2023-09-18 (×2): qty 1

## 2023-09-18 MED ORDER — CALCIUM CARBONATE ANTACID 500 MG PO CHEW: 2.0000 | CHEWABLE_TABLET | ORAL | Status: DC | PRN

## 2023-09-18 MED ORDER — DOCUSATE SODIUM 100 MG PO CAPS
100.0000 mg | ORAL_CAPSULE | Freq: Every day | ORAL | Status: DC
  Administered 2023-09-19: 100 mg via ORAL
  Filled 2023-09-18: qty 1

## 2023-09-18 MED ORDER — ACETAMINOPHEN 325 MG PO TABS
650.0000 mg | ORAL_TABLET | ORAL | Status: DC | PRN
  Administered 2023-09-18: 650 mg via ORAL
  Filled 2023-09-18: qty 2

## 2023-09-18 MED ORDER — LACTATED RINGERS IV BOLUS
1000.0000 mL | Freq: Once | INTRAVENOUS | Status: AC
  Administered 2023-09-18: 1000 mL via INTRAVENOUS

## 2023-09-18 NOTE — H&P (Signed)
 Chief Complaint:  Back Pain     HPI   None      Amanda Ellison is a 33 y.o. G3P2002 at [redacted]w[redacted]d who presents to maternity admissions reporting back pain back.  Back pain started this morning, reports that it has become more frequent. She describes a low back pain that "feels like contractions".  Currently rates pain as 8/10.  At home, she has not tried stretching, pain medications.  Pain is alleviated by nothing.  Pain is aggravated by movement.  Endorses abdominal cramping. Denies vaginal bleeding, leaking of fluid.  Endorses good fetal movement. Most recent dose of Procardia  was this morning. Denies RUQ pain, headache, changes in vision.   Pregnancy Course: Receives care at [facility]. Prenatal records reviewed.  Pregnancy complicated by obesity, chronic hypertension, gestational diabetes, LGA.       Past Medical History:  Diagnosis Date   Anemia 04/24/2016   Cholelithiasis affecting pregnancy, antepartum     Gestational diabetes     Hypertension                           OB History  Gravida Para Term Preterm AB Living   3 2 2  0 0 2   SAB IAB Ectopic Multiple Live Births      0 0 0 0 2         # Outcome Date GA Lbr Len/2nd Weight Sex Type Anes PTL Lv  3 Current                    2 Term 10/26/19 [redacted]w[redacted]d / 00:33 3100 g F Vag-Spont EPI   LIV  1 Term 02/09/15 [redacted]w[redacted]d 02:21 / 00:51 3425 g F Vag-Spont EPI   LIV    No past surgical history on file.          Family History  Problem Relation Age of Onset   Other Mother          sick one week and died, unknown cause   Other Father          died from car accident   Alcohol abuse Neg Hx     Arthritis Neg Hx     Asthma Neg Hx     Diabetes Neg Hx     Drug abuse Neg Hx     Early death Neg Hx     Birth defects Neg Hx     Cancer Neg Hx     COPD Neg Hx     Depression Neg Hx     Hearing loss Neg Hx     Heart disease Neg Hx     Hyperlipidemia Neg Hx     Hypertension Neg Hx     Kidney disease Neg Hx     Learning disabilities Neg Hx      Mental illness Neg Hx     Mental retardation Neg Hx     Miscarriages / Stillbirths Neg Hx     Stroke Neg Hx     Vision loss Neg Hx     Varicose Veins Neg Hx          Social History  Social History        Tobacco Use   Smoking status: Never   Smokeless tobacco: Never  Vaping Use   Vaping status: Never Used  Substance Use Topics   Alcohol use: No   Drug use: No      Allergies  No Known Allergies  Medications Prior to Admission  Medication Sig Dispense Refill Last Dose/Taking   Accu-Chek Softclix Lancets lancets 1 each by Other route 4 (four) times daily. 100 each 12 09/18/2023   aspirin  EC 81 MG tablet Take 2 tablets (162 mg total) by mouth daily. Take after 12 weeks for prevention of preeclampsia later in pregnancy 300 tablet 2 09/18/2023   Blood Glucose Monitoring Suppl (ACCU-CHEK GUIDE) w/Device KIT 1 kit by Does not apply route 4 (four) times daily. 1 kit 0 09/18/2023   docusate sodium  (COLACE) 100 MG capsule Take 1 capsule (100 mg total) by mouth daily as needed for mild constipation. 30 capsule 9 09/18/2023   Glucose Blood (BLOOD GLUCOSE TEST STRIPS 333) STRP 1 strip by In Vitro route 4 (four) times daily. 100 strip 12 09/18/2023   NIFEdipine  (PROCARDIA -XL/NIFEDICAL-XL) 30 MG 24 hr tablet Take 1 tablet (30 mg total) by mouth daily.     09/18/2023   Prenatal Vit-Fe Fum-FA-Omega (PNV PRENATAL PLUS MULTIVIT+DHA) 27-1 & 312 MG MISC Take 1 tablet by mouth daily. 30 each 12            I have reviewed patient's Past Medical Hx, Surgical Hx, Family Hx, Social Hx, medications and allergies.    ROS  Pertinent items noted in HPI and remainder of comprehensive ROS otherwise negative.    PHYSICAL EXAM  Patient Vitals for the past 24 hrs:   BP Temp Pulse Resp SpO2  09/18/23 1916 (!) 140/78 -- 99 -- --  09/18/23 1915 -- -- -- -- 99 %  09/18/23 1901 (!) 148/74 -- (!) 101 -- --  09/18/23 1855 -- -- -- -- 98 %  09/18/23 1850 -- -- -- -- 96 %  09/18/23 1846 (!) 147/78 -- 100  -- --  09/18/23 1845 -- -- -- -- 97 %  09/18/23 1840 (!) 146/74 -- (!) 103 -- 98 %  09/18/23 1835 -- -- -- -- 98 %  09/18/23 1830 -- -- -- -- 97 %  09/18/23 1826 -- -- -- -- 97 %  09/18/23 1825 -- -- -- -- 97 %  09/18/23 1820 -- -- -- -- 98 %  09/18/23 1801 (!) 158/81 -- (!) 105 -- --  09/18/23 1746 (!) 164/83 -- 92 -- --  09/18/23 1716 (!) 164/87 -- 93 -- --  09/18/23 1701 (!) 158/81 -- 91 -- --  09/18/23 1659 -- -- 96 -- --  09/18/23 1631 (!) 147/74 99.4 F (37.4 C) 94 18 --      Constitutional: Well-developed, well-nourished female in no acute distress.  HEENT: atraumatic, normocephalic. Neck has normal ROM. EOM intact. Cardiovascular: normal rate & rhythm, warm and well-perfused Respiratory: normal effort, no problems with respiration noted. CTA bilaterally.  GI: Abd soft, non-tender, non-distended MSK: Extremities nontender, no edema, normal ROM Skin: warm and dry. Acyanotic, no jaundice or pallor. Neurologic: Alert and oriented x 4. No abnormal coordination. Psychiatric: Normal mood. Speech not slurred, not rapid/pressured. Patient is cooperative. GU: no CVA tenderness          Cervical exam:  5:16PM Chaperoned by Autumn Boast RN Dilation: 1.5 Effacement (%): 50 Cervical Position: Posterior Station: Ballotable Presentation: Vertex 7:24 PM Chaperoned by Eustacio Highman RN Dilation: 1.5 Effacement (%): 50 Cervical Position: Posterior Station: Ballotable Presentation: Vertex Exam by:: Maryclare Smoke, PA   Fetal Tracing: Baseline FHR: 150 per minute Fetal heart variability: moderate Fetal Heart Rate accelerations: yes Fetal Heart Rate decelerations: none Fetal Non-stress Test: reactive Toco: uterine contractions every 3.5 minutes  Labs: Lab Results Last 24 Hours       Results for orders placed or performed during the hospital encounter of 09/18/23 (from the past 24 hours)  CBC     Status: Abnormal    Collection Time: 09/18/23  5:17 PM  Result Value Ref  Range    WBC 7.7 4.0 - 10.5 K/uL    RBC 4.01 3.87 - 5.11 MIL/uL    Hemoglobin 9.9 (L) 12.0 - 15.0 g/dL    HCT 40.9 (L) 81.1 - 46.0 %    MCV 77.1 (L) 80.0 - 100.0 fL    MCH 24.7 (L) 26.0 - 34.0 pg    MCHC 32.0 30.0 - 36.0 g/dL    RDW 91.4 78.2 - 95.6 %    Platelets 298 150 - 400 K/uL    nRBC 0.0 0.0 - 0.2 %  Comprehensive metabolic panel with GFR     Status: Abnormal    Collection Time: 09/18/23  5:17 PM  Result Value Ref Range    Sodium 134 (L) 135 - 145 mmol/L    Potassium 4.2 3.5 - 5.1 mmol/L    Chloride 104 98 - 111 mmol/L    CO2 19 (L) 22 - 32 mmol/L    Glucose, Bld 110 (H) 70 - 99 mg/dL    BUN 5 (L) 6 - 20 mg/dL    Creatinine, Ser 2.13 0.44 - 1.00 mg/dL    Calcium  9.6 8.9 - 10.3 mg/dL    Total Protein 6.2 (L) 6.5 - 8.1 g/dL    Albumin 2.7 (L) 3.5 - 5.0 g/dL    AST 24 15 - 41 U/L    ALT 19 0 - 44 U/L    Alkaline Phosphatase 107 38 - 126 U/L    Total Bilirubin 0.5 0.0 - 1.2 mg/dL    GFR, Estimated >08 >65 mL/min    Anion gap 11 5 - 15  Urinalysis, Routine w reflex microscopic -Urine, Clean Catch     Status: Abnormal    Collection Time: 09/18/23  5:54 PM  Result Value Ref Range    Color, Urine YELLOW YELLOW    APPearance HAZY (A) CLEAR    Specific Gravity, Urine 1.009 1.005 - 1.030    pH 7.0 5.0 - 8.0    Glucose, UA NEGATIVE NEGATIVE mg/dL    Hgb urine dipstick NEGATIVE NEGATIVE    Bilirubin Urine NEGATIVE NEGATIVE    Ketones, ur 20 (A) NEGATIVE mg/dL    Protein, ur NEGATIVE NEGATIVE mg/dL    Nitrite NEGATIVE NEGATIVE    Leukocytes,Ua SMALL (A) NEGATIVE    RBC / HPF 0-5 0 - 5 RBC/hpf    WBC, UA 0-5 0 - 5 WBC/hpf    Bacteria, UA RARE (A) NONE SEEN    Squamous Epithelial / HPF 21-50 0 - 5 /HPF    Mucus PRESENT    Protein / creatinine ratio, urine     Status: Abnormal    Collection Time: 09/18/23  5:54 PM  Result Value Ref Range    Creatinine, Urine 54 mg/dL    Total Protein, Urine 9 mg/dL    Protein Creatinine Ratio 0.17 (H) 0.00 - 0.15 mg/mg[Cre]  Wet prep,  genital     Status: Abnormal    Collection Time: 09/18/23  5:54 PM    Specimen: Urine, Clean Catch  Result Value Ref Range    Yeast Wet Prep HPF POC NONE SEEN NONE SEEN    Trich, Wet Prep NONE SEEN NONE SEEN    Clue  Cells Wet Prep HPF POC NONE SEEN NONE SEEN    WBC, Wet Prep HPF POC >=10 (A) <10    Sperm NONE SEEN          Imaging:  No results found. Pt informed that the ultrasound is considered a limited OB ultrasound and is not intended to be a complete ultrasound exam.  Patient also informed that the ultrasound is not being completed with the intent of assessing for fetal or placental anomalies or any pelvic abnormalities.  Explained that the purpose of today's ultrasound is to assess for  presentation.  Patient acknowledges the purpose of the exam and the limitations of the study.   Presentation: VERTEX      Orders Placed This Encounter  Procedures   Wet prep, genital   Urinalysis, Routine w reflex microscopic -Urine, Clean Catch   CBC   Comprehensive metabolic panel with GFR   Protein / creatinine ratio, urine   Notify physician (specify) Confirmatory reading of BP> 160/110 15 minutes later   Apply Hypertensive Disorders of Pregnancy Care Plan   Measure blood pressure   Insert peripheral IV       Meds ordered this encounter  Medications   acetaminophen  (TYLENOL ) tablet 1,000 mg   DISCONTD: NIFEdipine  (PROCARDIA ) capsule 10 mg   DISCONTD: NIFEdipine  (PROCARDIA ) capsule 20 mg   DISCONTD: NIFEdipine  (PROCARDIA ) capsule 20 mg   DISCONTD: labetalol  (NORMODYNE ) injection 40 mg   lactated ringers  bolus 1,000 mL   cyclobenzaprine (FLEXERIL) tablet 10 mg      ASSESSMENT    1. Chronic hypertension affecting pregnancy   2. Uterine contractions at greater than 20 weeks of gestation   3. [redacted] weeks gestation of pregnancy       PLAN  Admit for observation for elevated blood pressures. Increase blood pressure medication dosing to Procardia  30 mg twice daily.      Allergies  as of 09/18/2023   (No Known Allergies)    Noreene Bearded, PA

## 2023-09-18 NOTE — MAU Provider Note (Signed)
 Chief Complaint:  Back Pain   HPI   None     Amanda Ellison is a 33 y.o. G3P2002 at [redacted]w[redacted]d who presents to maternity admissions reporting back pain back.  Back pain started this morning, reports that it has become more frequent. She describes a low back pain that "feels like contractions".  Currently rates pain as 8/10.  At home, she has not tried stretching, pain medications.  Pain is alleviated by nothing.  Pain is aggravated by movement.  Endorses abdominal cramping. Denies vaginal bleeding, leaking of fluid.  Endorses good fetal movement. Most recent dose of Procardia  was this morning. Denies RUQ pain, headache, changes in vision.  Pregnancy Course: Receives care at [facility]. Prenatal records reviewed.  Pregnancy complicated by obesity, chronic hypertension, gestational diabetes, LGA.  Past Medical History:  Diagnosis Date   Anemia 04/24/2016   Cholelithiasis affecting pregnancy, antepartum    Gestational diabetes    Hypertension    OB History  Gravida Para Term Preterm AB Living  3 2 2  0 0 2  SAB IAB Ectopic Multiple Live Births  0 0 0 0 2    # Outcome Date GA Lbr Len/2nd Weight Sex Type Anes PTL Lv  3 Current           2 Term 10/26/19 [redacted]w[redacted]d / 00:33 3100 g F Vag-Spont EPI  LIV  1 Term 02/09/15 [redacted]w[redacted]d 02:21 / 00:51 3425 g F Vag-Spont EPI  LIV   No past surgical history on file. Family History  Problem Relation Age of Onset   Other Mother        sick one week and died, unknown cause   Other Father        died from car accident   Alcohol abuse Neg Hx    Arthritis Neg Hx    Asthma Neg Hx    Diabetes Neg Hx    Drug abuse Neg Hx    Early death Neg Hx    Birth defects Neg Hx    Cancer Neg Hx    COPD Neg Hx    Depression Neg Hx    Hearing loss Neg Hx    Heart disease Neg Hx    Hyperlipidemia Neg Hx    Hypertension Neg Hx    Kidney disease Neg Hx    Learning disabilities Neg Hx    Mental illness Neg Hx    Mental retardation Neg Hx    Miscarriages / Stillbirths Neg Hx     Stroke Neg Hx    Vision loss Neg Hx    Varicose Veins Neg Hx    Social History   Tobacco Use   Smoking status: Never   Smokeless tobacco: Never  Vaping Use   Vaping status: Never Used  Substance Use Topics   Alcohol use: No   Drug use: No   No Known Allergies Medications Prior to Admission  Medication Sig Dispense Refill Last Dose/Taking   Accu-Chek Softclix Lancets lancets 1 each by Other route 4 (four) times daily. 100 each 12 09/18/2023   aspirin  EC 81 MG tablet Take 2 tablets (162 mg total) by mouth daily. Take after 12 weeks for prevention of preeclampsia later in pregnancy 300 tablet 2 09/18/2023   Blood Glucose Monitoring Suppl (ACCU-CHEK GUIDE) w/Device KIT 1 kit by Does not apply route 4 (four) times daily. 1 kit 0 09/18/2023   docusate sodium  (COLACE) 100 MG capsule Take 1 capsule (100 mg total) by mouth daily as needed for mild constipation. 30 capsule  9 09/18/2023   Glucose Blood (BLOOD GLUCOSE TEST STRIPS 333) STRP 1 strip by In Vitro route 4 (four) times daily. 100 strip 12 09/18/2023   NIFEdipine  (PROCARDIA -XL/NIFEDICAL-XL) 30 MG 24 hr tablet Take 1 tablet (30 mg total) by mouth daily.   09/18/2023   Prenatal Vit-Fe Fum-FA-Omega (PNV PRENATAL PLUS MULTIVIT+DHA) 27-1 & 312 MG MISC Take 1 tablet by mouth daily. 30 each 12     I have reviewed patient's Past Medical Hx, Surgical Hx, Family Hx, Social Hx, medications and allergies.   ROS  Pertinent items noted in HPI and remainder of comprehensive ROS otherwise negative.   PHYSICAL EXAM  Patient Vitals for the past 24 hrs:  BP Temp Pulse Resp SpO2  09/18/23 1916 (!) 140/78 -- 99 -- --  09/18/23 1915 -- -- -- -- 99 %  09/18/23 1901 (!) 148/74 -- (!) 101 -- --  09/18/23 1855 -- -- -- -- 98 %  09/18/23 1850 -- -- -- -- 96 %  09/18/23 1846 (!) 147/78 -- 100 -- --  09/18/23 1845 -- -- -- -- 97 %  09/18/23 1840 (!) 146/74 -- (!) 103 -- 98 %  09/18/23 1835 -- -- -- -- 98 %  09/18/23 1830 -- -- -- -- 97 %  09/18/23 1826 --  -- -- -- 97 %  09/18/23 1825 -- -- -- -- 97 %  09/18/23 1820 -- -- -- -- 98 %  09/18/23 1801 (!) 158/81 -- (!) 105 -- --  09/18/23 1746 (!) 164/83 -- 92 -- --  09/18/23 1716 (!) 164/87 -- 93 -- --  09/18/23 1701 (!) 158/81 -- 91 -- --  09/18/23 1659 -- -- 96 -- --  09/18/23 1631 (!) 147/74 99.4 F (37.4 C) 94 18 --    Constitutional: Well-developed, well-nourished female in no acute distress.  HEENT: atraumatic, normocephalic. Neck has normal ROM. EOM intact. Cardiovascular: normal rate & rhythm, warm and well-perfused Respiratory: normal effort, no problems with respiration noted. CTA bilaterally.  GI: Abd soft, non-tender, non-distended MSK: Extremities nontender, no edema, normal ROM Skin: warm and dry. Acyanotic, no jaundice or pallor. Neurologic: Alert and oriented x 4. No abnormal coordination. Psychiatric: Normal mood. Speech not slurred, not rapid/pressured. Patient is cooperative. GU: no CVA tenderness  Cervical exam:  5:16PM Chaperoned by Autumn Boast RN Dilation: 1.5 Effacement (%): 50 Cervical Position: Posterior Station: Ballotable Presentation: Vertex 7:24 PM Chaperoned by Eustacio Highman RN Dilation: 1.5 Effacement (%): 50 Cervical Position: Posterior Station: Ballotable Presentation: Vertex Exam by:: Maryclare Smoke, PA  Fetal Tracing: Baseline FHR: 150 per minute Fetal heart variability: moderate Fetal Heart Rate accelerations: yes Fetal Heart Rate decelerations: none Fetal Non-stress Test: reactive Toco: uterine contractions every 3.5 minutes  Labs: Results for orders placed or performed during the hospital encounter of 09/18/23 (from the past 24 hours)  CBC     Status: Abnormal   Collection Time: 09/18/23  5:17 PM  Result Value Ref Range   WBC 7.7 4.0 - 10.5 K/uL   RBC 4.01 3.87 - 5.11 MIL/uL   Hemoglobin 9.9 (L) 12.0 - 15.0 g/dL   HCT 16.1 (L) 09.6 - 04.5 %   MCV 77.1 (L) 80.0 - 100.0 fL   MCH 24.7 (L) 26.0 - 34.0 pg   MCHC 32.0 30.0 - 36.0  g/dL   RDW 40.9 81.1 - 91.4 %   Platelets 298 150 - 400 K/uL   nRBC 0.0 0.0 - 0.2 %  Comprehensive metabolic panel with GFR  Status: Abnormal   Collection Time: 09/18/23  5:17 PM  Result Value Ref Range   Sodium 134 (L) 135 - 145 mmol/L   Potassium 4.2 3.5 - 5.1 mmol/L   Chloride 104 98 - 111 mmol/L   CO2 19 (L) 22 - 32 mmol/L   Glucose, Bld 110 (H) 70 - 99 mg/dL   BUN 5 (L) 6 - 20 mg/dL   Creatinine, Ser 1.47 0.44 - 1.00 mg/dL   Calcium  9.6 8.9 - 10.3 mg/dL   Total Protein 6.2 (L) 6.5 - 8.1 g/dL   Albumin 2.7 (L) 3.5 - 5.0 g/dL   AST 24 15 - 41 U/L   ALT 19 0 - 44 U/L   Alkaline Phosphatase 107 38 - 126 U/L   Total Bilirubin 0.5 0.0 - 1.2 mg/dL   GFR, Estimated >82 >95 mL/min   Anion gap 11 5 - 15  Urinalysis, Routine w reflex microscopic -Urine, Clean Catch     Status: Abnormal   Collection Time: 09/18/23  5:54 PM  Result Value Ref Range   Color, Urine YELLOW YELLOW   APPearance HAZY (A) CLEAR   Specific Gravity, Urine 1.009 1.005 - 1.030   pH 7.0 5.0 - 8.0   Glucose, UA NEGATIVE NEGATIVE mg/dL   Hgb urine dipstick NEGATIVE NEGATIVE   Bilirubin Urine NEGATIVE NEGATIVE   Ketones, ur 20 (A) NEGATIVE mg/dL   Protein, ur NEGATIVE NEGATIVE mg/dL   Nitrite NEGATIVE NEGATIVE   Leukocytes,Ua SMALL (A) NEGATIVE   RBC / HPF 0-5 0 - 5 RBC/hpf   WBC, UA 0-5 0 - 5 WBC/hpf   Bacteria, UA RARE (A) NONE SEEN   Squamous Epithelial / HPF 21-50 0 - 5 /HPF   Mucus PRESENT   Protein / creatinine ratio, urine     Status: Abnormal   Collection Time: 09/18/23  5:54 PM  Result Value Ref Range   Creatinine, Urine 54 mg/dL   Total Protein, Urine 9 mg/dL   Protein Creatinine Ratio 0.17 (H) 0.00 - 0.15 mg/mg[Cre]  Wet prep, genital     Status: Abnormal   Collection Time: 09/18/23  5:54 PM   Specimen: Urine, Clean Catch  Result Value Ref Range   Yeast Wet Prep HPF POC NONE SEEN NONE SEEN   Trich, Wet Prep NONE SEEN NONE SEEN   Clue Cells Wet Prep HPF POC NONE SEEN NONE SEEN   WBC, Wet  Prep HPF POC >=10 (A) <10   Sperm NONE SEEN     Imaging:  No results found. Pt informed that the ultrasound is considered a limited OB ultrasound and is not intended to be a complete ultrasound exam.  Patient also informed that the ultrasound is not being completed with the intent of assessing for fetal or placental anomalies or any pelvic abnormalities.  Explained that the purpose of today's ultrasound is to assess for  presentation.  Patient acknowledges the purpose of the exam and the limitations of the study.   Presentation: VERTEX  MDM & MAU COURSE  MDM: High  MAU Course: -Vital signs within normal limits other than elevated BP.  -CBC, CMP, urine protein for preeclampsia workup, will monitor BP. -UA, wet prep, GC/CT for infection to rule out potential causes of preterm contractions. -Tylenol , Flexeril, and LR bolus for back pain.  -Cervix posterior, 1.5 cm dilated. Presentation vertex by bedside US . Plan to recheck cervix in ~2 hours. -CMP without significant abnormalities, LFTs within normal limits. CBC shows worsened anemia, otherwise unremarkable, platelets normal.  -UA  and wet prep negative for infection.  -Urine protein/creatinine ratio at 0.17, no baseline for comparison. -Consulted with Dr. Alto Atta. Labs within normal limits, BP improved but above pt baseline. Will admit for BP monitoring overnight.   Orders Placed This Encounter  Procedures   Wet prep, genital   Urinalysis, Routine w reflex microscopic -Urine, Clean Catch   CBC   Comprehensive metabolic panel with GFR   Protein / creatinine ratio, urine   Notify physician (specify) Confirmatory reading of BP> 160/110 15 minutes later   Apply Hypertensive Disorders of Pregnancy Care Plan   Measure blood pressure   Insert peripheral IV   Meds ordered this encounter  Medications   acetaminophen  (TYLENOL ) tablet 1,000 mg   DISCONTD: NIFEdipine  (PROCARDIA ) capsule 10 mg   DISCONTD: NIFEdipine  (PROCARDIA ) capsule 20 mg    DISCONTD: NIFEdipine  (PROCARDIA ) capsule 20 mg   DISCONTD: labetalol  (NORMODYNE ) injection 40 mg   lactated ringers  bolus 1,000 mL   cyclobenzaprine (FLEXERIL) tablet 10 mg    ASSESSMENT   1. Chronic hypertension affecting pregnancy   2. Uterine contractions at greater than 20 weeks of gestation   3. [redacted] weeks gestation of pregnancy     PLAN  Admit for observation for elevated blood pressures. Increase blood pressure medication dosing to Procardia  30 mg twice daily.   Allergies as of 09/18/2023   (No Known Allergies)   Noreene Bearded, PA

## 2023-09-18 NOTE — MAU Note (Addendum)
 Amanda Ellison is a 33 y.o. at [redacted]w[redacted]d here in MAU reporting: started having pain in her back this morning.  Denies any vag bleeding or leaking good fetal movement reported   LMP:  Onset of complaint: this morning.  Pain score: 8-10 Vitals:   09/18/23 1631  BP: (!) 147/74  Pulse: 94  Resp: 18  Temp: 99.4 F (37.4 C)     FHT:  147 Lab orders placed from triage: u/a

## 2023-09-19 ENCOUNTER — Other Ambulatory Visit: Payer: Self-pay

## 2023-09-19 ENCOUNTER — Encounter (HOSPITAL_COMMUNITY): Payer: Self-pay | Admitting: Obstetrics & Gynecology

## 2023-09-19 DIAGNOSIS — Z3A34 34 weeks gestation of pregnancy: Secondary | ICD-10-CM

## 2023-09-19 DIAGNOSIS — O10913 Unspecified pre-existing hypertension complicating pregnancy, third trimester: Secondary | ICD-10-CM | POA: Diagnosis not present

## 2023-09-19 LAB — CBC
HCT: 27.7 % — ABNORMAL LOW (ref 36.0–46.0)
Hemoglobin: 8.8 g/dL — ABNORMAL LOW (ref 12.0–15.0)
MCH: 24.4 pg — ABNORMAL LOW (ref 26.0–34.0)
MCHC: 31.8 g/dL (ref 30.0–36.0)
MCV: 76.9 fL — ABNORMAL LOW (ref 80.0–100.0)
Platelets: 270 10*3/uL (ref 150–400)
RBC: 3.6 MIL/uL — ABNORMAL LOW (ref 3.87–5.11)
RDW: 14.6 % (ref 11.5–15.5)
WBC: 6.9 10*3/uL (ref 4.0–10.5)
nRBC: 0 % (ref 0.0–0.2)

## 2023-09-19 LAB — COMPREHENSIVE METABOLIC PANEL WITH GFR
ALT: 17 U/L (ref 0–44)
AST: 20 U/L (ref 15–41)
Albumin: 2.2 g/dL — ABNORMAL LOW (ref 3.5–5.0)
Alkaline Phosphatase: 92 U/L (ref 38–126)
Anion gap: 7 (ref 5–15)
BUN: <5 mg/dL — ABNORMAL LOW (ref 6–20)
CO2: 21 mmol/L — ABNORMAL LOW (ref 22–32)
Calcium: 8.4 mg/dL — ABNORMAL LOW (ref 8.9–10.3)
Chloride: 107 mmol/L (ref 98–111)
Creatinine, Ser: 0.43 mg/dL — ABNORMAL LOW (ref 0.44–1.00)
GFR, Estimated: >60 mL/min (ref >60)
Glucose, Bld: 104 mg/dL — ABNORMAL HIGH (ref 70–99)
Potassium: 3.6 mmol/L (ref 3.5–5.1)
Sodium: 135 mmol/L (ref 135–145)
Total Bilirubin: 0.5 mg/dL (ref 0.0–1.2)
Total Protein: 5.4 g/dL — ABNORMAL LOW (ref 6.5–8.1)

## 2023-09-19 LAB — GLUCOSE, CAPILLARY
Glucose-Capillary: 97 mg/dL (ref 70–99)
Glucose-Capillary: 162 mg/dL — ABNORMAL HIGH (ref 70–99)
Glucose-Capillary: 106 mg/dL — ABNORMAL HIGH (ref 70–99)

## 2023-09-19 MED ORDER — BETAMETHASONE SOD PHOS & ACET 6 (3-3) MG/ML IJ SUSP
12.0000 mg | INTRAMUSCULAR | Status: AC
  Administered 2023-09-19 – 2023-09-20 (×2): 12 mg via INTRAMUSCULAR
  Filled 2023-09-19: qty 5

## 2023-09-19 NOTE — Plan of Care (Signed)
  Problem: Education: Goal: Knowledge of disease or condition will improve Outcome: Progressing Goal: Knowledge of the prescribed therapeutic regimen will improve Outcome: Progressing   Problem: Fluid Volume: Goal: Peripheral tissue perfusion will improve Outcome: Progressing   Problem: Clinical Measurements: Goal: Complications related to disease process, condition or treatment will be avoided or minimized Outcome: Progressing   Problem: Education: Goal: Knowledge of disease or condition will improve Outcome: Progressing Goal: Knowledge of the prescribed therapeutic regimen will improve Outcome: Progressing Goal: Individualized Educational Video(s) Outcome: Progressing   Problem: Clinical Measurements: Goal: Complications related to the disease process, condition or treatment will be avoided or minimized Outcome: Progressing   Problem: Education: Goal: Knowledge of General Education information will improve Description: Including pain rating scale, medication(s)/side effects and non-pharmacologic comfort measures Outcome: Progressing   Problem: Health Behavior/Discharge Planning: Goal: Ability to manage health-related needs will improve Outcome: Progressing   Problem: Clinical Measurements: Goal: Ability to maintain clinical measurements within normal limits will improve Outcome: Progressing Goal: Will remain free from infection Outcome: Progressing Goal: Diagnostic test results will improve Outcome: Progressing Goal: Respiratory complications will improve Outcome: Progressing Goal: Cardiovascular complication will be avoided Outcome: Progressing   Problem: Activity: Goal: Risk for activity intolerance will decrease Outcome: Progressing   Problem: Nutrition: Goal: Adequate nutrition will be maintained Outcome: Progressing   Problem: Coping: Goal: Level of anxiety will decrease Outcome: Progressing   Problem: Elimination: Goal: Will not experience  complications related to bowel motility Outcome: Progressing Goal: Will not experience complications related to urinary retention Outcome: Progressing   Problem: Pain Managment: Goal: General experience of comfort will improve and/or be controlled Outcome: Progressing   Problem: Safety: Goal: Ability to remain free from injury will improve Outcome: Progressing   Problem: Skin Integrity: Goal: Risk for impaired skin integrity will decrease Outcome: Progressing

## 2023-09-19 NOTE — Progress Notes (Signed)
 Patient ID: Amanda Ellison, female   DOB: 1990-10-16, 32 y.o.   MRN: [id number] FACULTY PRACTICE ANTEPARTUM(COMPREHENSIVE) NOTE  Amanda Ellison is a 33 y.o. [id number] with Estimated Date of Delivery: 10/25/23   By  early ultrasound [redacted]w[redacted]d  who is admitted for management of worsening cHTN, no evidence of superimposed pre eclampsia at this point.    Fetal presentation is cephalic. Length of Stay:  0  Days  Date of admission:09/18/2023  Subjective: No complaints Patient reports the fetal movement as active. Patient reports uterine contraction  activity as none. Patient reports  vaginal bleeding as none. Patient describes fluid per vagina as None.  Vitals:  Blood pressure 100/60, pulse 86, temperature 98 F (36.7 C), temperature source Oral, resp. rate 17, last menstrual period 01/02/2023, SpO2 98%, unknown if currently breastfeeding. Vitals:   09/18/23 2354 09/19/23 0613 09/19/23 0720 09/19/23 0741  BP: 139/76 (!) 151/84 124/75 100/60  Pulse: 93 91 92 86  Resp: 16 16 16 17   Temp: 98 F (36.7 C) 98.1 F (36.7 C)  98 F (36.7 C)  TempSrc: Oral Oral  Oral  SpO2: 100% 100% 100% 98%   Physical Examination:  General appearance - alert, well appearing, and in no distress Abdomen - soft, nontender, nondistended, no masses or organomegaly Fundal Height:  size equals dates Pelvic Exam:  examination not indicated Cervical Exam: Not evaluated Extremities: extremities normal, atraumatic, no cyanosis or edema with DTRs 2+ bilaterally Membranes:intact  Fetal Monitoring:  Baseline: 130s bpm, Variability: Good {> 6 bpm), Accelerations: Reactive, and Decelerations: Absent   reactive  Labs:  Results for orders placed or performed during the hospital encounter of 09/18/23 (from the past 24 hours)  Type and screen [facility]   Collection Time: 09/18/23  5:15 PM  Result Value Ref Range   ABO/RH(D) O NEG    Antibody Screen POS    Sample Expiration 09/21/2023,2359    Antibody  Identification      PASSIVELY ACQUIRED ANTI-D Performed at [facility], [address], [place], [place] [address]   CBC   Collection Time: 09/18/23  5:17 PM  Result Value Ref Range   WBC 7.7 4.0 - 10.5 K/uL   RBC 4.01 3.87 - 5.11 MIL/uL   Hemoglobin 9.9 (L) 12.0 - 15.0 g/dL   HCT 95.6 (L) 21.3 - 08.6 %   MCV 77.1 (L) 80.0 - 100.0 fL   MCH 24.7 (L) 26.0 - 34.0 pg   MCHC 32.0 30.0 - 36.0 g/dL   RDW 57.8 46.9 - 62.9 %   Platelets 298 150 - 400 K/uL   nRBC 0.0 0.0 - 0.2 %  Comprehensive metabolic panel with GFR   Collection Time: 09/18/23  5:17 PM  Result Value Ref Range   Sodium 134 (L) 135 - 145 mmol/L   Potassium 4.2 3.5 - 5.1 mmol/L   Chloride 104 98 - 111 mmol/L   CO2 19 (L) 22 - 32 mmol/L   Glucose, Bld 110 (H) 70 - 99 mg/dL   BUN 5 (L) 6 - 20 mg/dL   Creatinine, Ser 5.28 0.44 - 1.00 mg/dL   Calcium  9.6 8.9 - 10.3 mg/dL   Total Protein 6.2 (L) 6.5 - 8.1 g/dL   Albumin 2.7 (L) 3.5 - 5.0 g/dL   AST 24 15 - 41 U/L   ALT 19 0 - 44 U/L   Alkaline Phosphatase 107 38 - 126 U/L   Total Bilirubin 0.5 0.0 - 1.2 mg/dL   GFR, Estimated >41 >  60 mL/min   Anion gap 11 5 - 15  Wet prep, genital   Collection Time: 09/18/23  5:54 PM   Specimen: Urine, Clean Catch  Result Value Ref Range   Yeast Wet Prep HPF POC NONE SEEN NONE SEEN   Trich, Wet Prep NONE SEEN NONE SEEN   Clue Cells Wet Prep HPF POC NONE SEEN NONE SEEN   WBC, Wet Prep HPF POC >=10 (A) <10   Sperm NONE SEEN   Urinalysis, Routine w reflex microscopic -Urine, Clean Catch   Collection Time: 09/18/23  5:54 PM  Result Value Ref Range   Color, Urine YELLOW YELLOW   APPearance HAZY (A) CLEAR   Specific Gravity, Urine 1.009 1.005 - 1.030   pH 7.0 5.0 - 8.0   Glucose, UA NEGATIVE NEGATIVE mg/dL   Hgb urine dipstick NEGATIVE NEGATIVE   Bilirubin Urine NEGATIVE NEGATIVE   Ketones, ur 20 (A) NEGATIVE mg/dL   Protein, ur NEGATIVE NEGATIVE mg/dL   Nitrite NEGATIVE NEGATIVE   Leukocytes,Ua SMALL (A) NEGATIVE   RBC /  HPF 0-5 0 - 5 RBC/hpf   WBC, UA 0-5 0 - 5 WBC/hpf   Bacteria, UA RARE (A) NONE SEEN   Squamous Epithelial / HPF 21-50 0 - 5 /HPF   Mucus PRESENT   Protein / creatinine ratio, urine   Collection Time: 09/18/23  5:54 PM  Result Value Ref Range   Creatinine, Urine 54 mg/dL   Total Protein, Urine 9 mg/dL   Protein Creatinine Ratio 0.17 (H) 0.00 - 0.15 mg/mg[Cre]  Comprehensive metabolic panel   Collection Time: 09/19/23  5:10 AM  Result Value Ref Range   Sodium 135 135 - 145 mmol/L   Potassium 3.6 3.5 - 5.1 mmol/L   Chloride 107 98 - 111 mmol/L   CO2 21 (L) 22 - 32 mmol/L   Glucose, Bld 104 (H) 70 - 99 mg/dL   BUN <5 (L) 6 - 20 mg/dL   Creatinine, Ser 9.60 (L) 0.44 - 1.00 mg/dL   Calcium  8.4 (L) 8.9 - 10.3 mg/dL   Total Protein 5.4 (L) 6.5 - 8.1 g/dL   Albumin 2.2 (L) 3.5 - 5.0 g/dL   AST 20 15 - 41 U/L   ALT 17 0 - 44 U/L   Alkaline Phosphatase 92 38 - 126 U/L   Total Bilirubin 0.5 0.0 - 1.2 mg/dL   GFR, Estimated >45 >40 mL/min   Anion gap 7 5 - 15  CBC   Collection Time: 09/19/23  5:10 AM  Result Value Ref Range   WBC 6.9 4.0 - 10.5 K/uL   RBC 3.60 (L) 3.87 - 5.11 MIL/uL   Hemoglobin 8.8 (L) 12.0 - 15.0 g/dL   HCT 98.1 (L) 19.1 - 47.8 %   MCV 76.9 (L) 80.0 - 100.0 fL   MCH 24.4 (L) 26.0 - 34.0 pg   MCHC 31.8 30.0 - 36.0 g/dL   RDW 29.5 62.1 - 30.8 %   Platelets 270 150 - 400 K/uL   nRBC 0.0 0.0 - 0.2 %    Imaging Studies:    No results found.   Medications:  Scheduled  betamethasone acetate-betamethasone sodium phosphate  12 mg Intramuscular Q24 Hr x 2   docusate sodium   100 mg Oral Daily   NIFEdipine   30 mg Oral BID   prenatal multivitamin  1 tablet Oral Q1200   I have reviewed the patient's current medications.  ASSESSMENT: [id number] [redacted]w[redacted]d Estimated Date of Delivery: 10/25/23  cHTN, exaserbation  LGA Rh neg  PLAN: ^procardia  to BID BMZ Monitoring If stable go home tomorrow after BMZ  Sixto Duhamel Deven Audi 09/19/2023,11:14 AM

## 2023-09-19 NOTE — Progress Notes (Signed)
 FACULTY PRACTICE ANTEPARTUM COMPREHENSIVE PROGRESS NOTE  Amanda Ellison is a 33 y.o. G3P2002 at [redacted]w[redacted]d who is admitted for blood pressure exacerbation in the setting of chronic hypertension.  Estimated Date of Delivery: 10/25/23    Length of Stay:  0 Days. Admitted 09/18/2023  Subjective: Doing well this morning without concerns. Denies headaches. Patient reports good fetal movement.  She reports no uterine contractions, no bleeding and no loss of fluid per vagina.  Vitals:  Blood pressure (!) 151/84, pulse 91, temperature 98.1 F (36.7 C), temperature source Oral, resp. rate 16, last menstrual period 01/02/2023, SpO2 100%, unknown if currently breastfeeding. Physical Examination: CONSTITUTIONAL: Well-developed, well-nourished female in no acute distress.  NEUROLOGIC: Alert and oriented to person, place, and time. No cranial nerve deficit noted. PSYCHIATRIC: Normal mood and affect. Normal behavior. Normal judgment and thought content. CARDIOVASCULAR: Normal heart rate noted  RESPIRATORY: Effort and breath sounds normal  MUSCULOSKELETAL: Normal range of motion. No edema and no tenderness. 2+ distal pulses. ABDOMEN: Soft, nontender, nondistended, gravid. CERVIX: deferred  NST completed in MAU last night and interpreted by that provider  Results for orders placed or performed during the hospital encounter of 09/18/23 (from the past 48 hours)  Type and screen [facility]     Status: None   Collection Time: 09/18/23  5:15 PM  Result Value Ref Range   ABO/RH(D) O NEG    Antibody Screen POS    Sample Expiration 09/21/2023,2359    Antibody Identification      PASSIVELY ACQUIRED ANTI-D Performed at [facility], [address], [place], [place] [address]   CBC     Status: Abnormal   Collection Time: 09/18/23  5:17 PM  Result Value Ref Range   WBC 7.7 4.0 - 10.5 K/uL   RBC 4.01 3.87 - 5.11 MIL/uL   Hemoglobin 9.9 (L) 12.0 - 15.0 g/dL   HCT 60.4 (L) 54.0 - 98.1  %   MCV 77.1 (L) 80.0 - 100.0 fL   MCH 24.7 (L) 26.0 - 34.0 pg   MCHC 32.0 30.0 - 36.0 g/dL   RDW 19.1 47.8 - 29.5 %   Platelets 298 150 - 400 K/uL   nRBC 0.0 0.0 - 0.2 %    Comment: Performed at [facility], [address] [place], [place] [address]  Comprehensive metabolic panel with GFR     Status: Abnormal   Collection Time: 09/18/23  5:17 PM  Result Value Ref Range   Sodium 134 (L) 135 - 145 mmol/L   Potassium 4.2 3.5 - 5.1 mmol/L   Chloride 104 98 - 111 mmol/L   CO2 19 (L) 22 - 32 mmol/L   Glucose, Bld 110 (H) 70 - 99 mg/dL    Comment: Glucose reference range applies only to samples taken after fasting for at least 8 hours.   BUN 5 (L) 6 - 20 mg/dL   Creatinine, Ser 8.65 0.44 - 1.00 mg/dL   Calcium  9.6 8.9 - 10.3 mg/dL   Total Protein 6.2 (L) 6.5 - 8.1 g/dL   Albumin 2.7 (L) 3.5 - 5.0 g/dL   AST 24 15 - 41 U/L   ALT 19 0 - 44 U/L   Alkaline Phosphatase 107 38 - 126 U/L   Total Bilirubin 0.5 0.0 - 1.2 mg/dL   GFR, Estimated >78 >46 mL/min    Comment: (NOTE) Calculated using the CKD-EPI Creatinine Equation (2021)    Anion gap 11 5 - 15    Comment: Performed  at [facility], [address], [place], [place] [address]  Urinalysis, Routine w reflex microscopic -Urine, Clean Catch     Status: Abnormal   Collection Time: 09/18/23  5:54 PM  Result Value Ref Range   Color, Urine YELLOW YELLOW   APPearance HAZY (A) CLEAR   Specific Gravity, Urine 1.009 1.005 - 1.030   pH 7.0 5.0 - 8.0   Glucose, UA NEGATIVE NEGATIVE mg/dL   Hgb urine dipstick NEGATIVE NEGATIVE   Bilirubin Urine NEGATIVE NEGATIVE   Ketones, ur 20 (A) NEGATIVE mg/dL   Protein, ur NEGATIVE NEGATIVE mg/dL   Nitrite NEGATIVE NEGATIVE   Leukocytes,Ua SMALL (A) NEGATIVE   RBC / HPF 0-5 0 - 5 RBC/hpf   WBC, UA 0-5 0 - 5 WBC/hpf   Bacteria, UA RARE (A) NONE SEEN   Squamous Epithelial / HPF 21-50 0 - 5 /HPF   Mucus PRESENT     Comment: Performed at [facility], [address],  [place], [place] [address]  Protein / creatinine ratio, urine     Status: Abnormal   Collection Time: 09/18/23  5:54 PM  Result Value Ref Range   Creatinine, Urine 54 mg/dL   Total Protein, Urine 9 mg/dL    Comment: NO NORMAL RANGE ESTABLISHED FOR THIS TEST   Protein Creatinine Ratio 0.17 (H) 0.00 - 0.15 mg/mg[Cre]    Comment: Performed at [facility], [address], [place], [place] [address]  Wet prep, genital     Status: Abnormal   Collection Time: 09/18/23  5:54 PM   Specimen: Urine, Clean Catch  Result Value Ref Range   Yeast Wet Prep HPF POC NONE SEEN NONE SEEN   Trich, Wet Prep NONE SEEN NONE SEEN   Clue Cells Wet Prep HPF POC NONE SEEN NONE SEEN   WBC, Wet Prep HPF POC >=10 (A) <10   Sperm NONE SEEN     Comment: Performed at [facility], [address], [place], [place] [address]  Comprehensive metabolic panel     Status: Abnormal   Collection Time: 09/19/23  5:10 AM  Result Value Ref Range   Sodium 135 135 - 145 mmol/L   Potassium 3.6 3.5 - 5.1 mmol/L   Chloride 107 98 - 111 mmol/L   CO2 21 (L) 22 - 32 mmol/L   Glucose, Bld 104 (H) 70 - 99 mg/dL    Comment: Glucose reference range applies only to samples taken after fasting for at least 8 hours.   BUN <5 (L) 6 - 20 mg/dL   Creatinine, Ser 2.95 (L) 0.44 - 1.00 mg/dL   Calcium  8.4 (L) 8.9 - 10.3 mg/dL   Total Protein 5.4 (L) 6.5 - 8.1 g/dL   Albumin 2.2 (L) 3.5 - 5.0 g/dL   AST 20 15 - 41 U/L   ALT 17 0 - 44 U/L   Alkaline Phosphatase 92 38 - 126 U/L   Total Bilirubin 0.5 0.0 - 1.2 mg/dL   GFR, Estimated >62 >13 mL/min    Comment: (NOTE) Calculated using the CKD-EPI Creatinine Equation (2021)    Anion gap 7 5 - 15    Comment: Performed at [facility], [address] [place], [place] [address]  CBC     Status: Abnormal   Collection Time: 09/19/23  5:10 AM  Result Value Ref Range   WBC 6.9 4.0 - 10.5 K/uL   RBC 3.60 (L) 3.87 - 5.11 MIL/uL   Hemoglobin 8.8 (L)  12.0 - 15.0 g/dL    Comment:  Reticulocyte Hemoglobin testing may be clinically indicated, consider ordering this additional test [id number]    HCT 27.7 (L) 36.0 - 46.0 %   MCV 76.9 (L) 80.0 - 100.0 fL   MCH 24.4 (L) 26.0 - 34.0 pg   MCHC 31.8 30.0 - 36.0 g/dL   RDW 30.8 65.7 - 84.6 %   Platelets 270 150 - 400 K/uL   nRBC 0.0 0.0 - 0.2 %    Comment: Performed at [facility], [address] [place], [place] [address]    No results found.  Current scheduled medications  docusate sodium   100 mg Oral Daily   NIFEdipine   30 mg Oral BID   prenatal multivitamin  1 tablet Oral Q1200    I have reviewed the patient's current medications.  A/P: Chronic hypertension: on nifedipine  30 mg daily at home, increased to 30 mg BID this admission, may need to increase further given suboptimal control, will repeat BP this morning and titrate accordingly, if improved, likely ok for outpatient follow up  -- f/u P/C ratio  -- titrate BP meds prn  2. GDMA1: continue FSGs  3. Dispo: potential discharge later today   Continue routine antenatal care.   Marci Setter, MD, FACOG Obstetrician & Gynecologist, [facility] for [facility], [facility]

## 2023-09-20 ENCOUNTER — Other Ambulatory Visit: Payer: Self-pay

## 2023-09-20 DIAGNOSIS — O10919 Unspecified pre-existing hypertension complicating pregnancy, unspecified trimester: Secondary | ICD-10-CM

## 2023-09-20 DIAGNOSIS — Z3A34 34 weeks gestation of pregnancy: Secondary | ICD-10-CM | POA: Diagnosis not present

## 2023-09-20 DIAGNOSIS — O10913 Unspecified pre-existing hypertension complicating pregnancy, third trimester: Secondary | ICD-10-CM | POA: Diagnosis not present

## 2023-09-20 LAB — GLUCOSE, CAPILLARY: Glucose-Capillary: 91 mg/dL (ref 70–99)

## 2023-09-20 MED ORDER — NIFEDIPINE ER 30 MG PO TB24: 30.0000 mg | ORAL_TABLET | Freq: Two times a day (BID) | ORAL | 2 refills | Status: AC

## 2023-09-20 MED ORDER — NIFEDIPINE ER OSMOTIC RELEASE 30 MG PO TB24: 30.0000 mg | ORAL_TABLET | Freq: Two times a day (BID) | ORAL | 2 refills | Status: DC

## 2023-09-20 NOTE — Plan of Care (Signed)
  Problem: Education: Goal: Knowledge of disease or condition will improve Outcome: Completed/Met Goal: Knowledge of the prescribed therapeutic regimen will improve Outcome: Completed/Met   Problem: Fluid Volume: Goal: Peripheral tissue perfusion will improve Outcome: Completed/Met   Problem: Clinical Measurements: Goal: Complications related to disease process, condition or treatment will be avoided or minimized Outcome: Completed/Met   Problem: Education: Goal: Knowledge of disease or condition will improve Outcome: Completed/Met Goal: Knowledge of the prescribed therapeutic regimen will improve Outcome: Completed/Met Goal: Individualized Educational Video(s) Outcome: Completed/Met   Problem: Clinical Measurements: Goal: Complications related to the disease process, condition or treatment will be avoided or minimized Outcome: Completed/Met   Problem: Education: Goal: Knowledge of General Education information will improve Description: Including pain rating scale, medication(s)/side effects and non-pharmacologic comfort measures Outcome: Completed/Met   Problem: Health Behavior/Discharge Planning: Goal: Ability to manage health-related needs will improve Outcome: Completed/Met   Problem: Clinical Measurements: Goal: Ability to maintain clinical measurements within normal limits will improve Outcome: Completed/Met Goal: Will remain free from infection Outcome: Completed/Met Goal: Diagnostic test results will improve Outcome: Completed/Met Goal: Respiratory complications will improve Outcome: Completed/Met Goal: Cardiovascular complication will be avoided Outcome: Completed/Met   Problem: Activity: Goal: Risk for activity intolerance will decrease Outcome: Completed/Met   Problem: Nutrition: Goal: Adequate nutrition will be maintained Outcome: Completed/Met   Problem: Coping: Goal: Level of anxiety will decrease Outcome: Completed/Met   Problem:  Elimination: Goal: Will not experience complications related to bowel motility Outcome: Completed/Met Goal: Will not experience complications related to urinary retention Outcome: Completed/Met   Problem: Pain Managment: Goal: General experience of comfort will improve and/or be controlled Outcome: Completed/Met   Problem: Safety: Goal: Ability to remain free from injury will improve Outcome: Completed/Met   Problem: Skin Integrity: Goal: Risk for impaired skin integrity will decrease Outcome: Completed/Met

## 2023-09-20 NOTE — Discharge Summary (Signed)
 Physician Discharge Summary  Patient ID: Amanda Ellison MRN: [id number] DOB/AGE: 05/07/1990 33 y.o.  Admit date: 09/18/2023 Discharge date: 09/20/2023  Admission Diagnoses: [redacted]w[redacted]d cHTN with exaserbation of BP  Discharge Diagnoses:  Active Problems:   * No active hospital problems. *   Discharged Condition: good  Hospital Course:  Admitted with ^BP on procardia  xl 30 every day Labs normal including P/Cr ration No severe range BPs ^procardia  xl to 30 mg BID Received course BMZ (5/17 + 5/18) Surveillance is reassuring  Discharged to follow up as scheduled  Consults:   Significant Diagnostic Studies: labs:   CBC    Latest Ref Rng & Units 09/19/2023    5:10 AM 09/18/2023    5:17 PM 08/12/2023    8:22 AM  CBC  WBC 4.0 - 10.5 K/uL 6.9  7.7  6.9   Hemoglobin 12.0 - 15.0 g/dL 8.8  9.9  40.9   Hematocrit 36.0 - 46.0 % 27.7  30.9  36.5   Platelets 150 - 400 K/uL 270  298  306        Latest Ref Rng & Units 09/19/2023    5:10 AM 09/18/2023    5:17 PM 03/30/2023    3:31 PM  CMP  Glucose 70 - 99 mg/dL 811  914  84   BUN 6 - 20 mg/dL 5  5  7    Creatinine 0.44 - 1.00 mg/dL 7.82  9.56  2.13   Sodium 135 - 145 mmol/L 135  134  135   Potassium 3.5 - 5.1 mmol/L 3.6  4.2  4.1   Chloride 98 - 111 mmol/L 107  104  103   CO2 22 - 32 mmol/L 21  19  19    Calcium  8.9 - 10.3 mg/dL 8.4  9.6  9.4   Total Protein 6.5 - 8.1 g/dL 5.4  6.2  6.0   Total Bilirubin 0.0 - 1.2 mg/dL 0.5  0.5  <0.8   Alkaline Phos 38 - 126 U/L 92  107  56   AST 15 - 41 U/L 20  24  20    ALT 0 - 44 U/L 17  19  37    P/Cr ratio 0.17  Treatments: ^Proacrdia xl 30 to BID  Discharge Exam: Blood pressure 132/80, pulse 92, temperature 98.6 F (37 C), temperature source Oral, resp. rate 16, last menstrual period 01/02/2023, SpO2 100%, unknown if currently breastfeeding. General appearance: alert, cooperative, and no distress GI: soft, non-tender; bowel sounds normal; no masses,  no organomegaly  Disposition:  Discharge disposition: 01-Home or Self Care       Discharge Instructions     Diet - low sodium heart healthy   Complete by: As directed    Increase activity slowly   Complete by: As directed       Allergies as of 09/20/2023   No Known Allergies      Medication List     TAKE these medications    Accu-Chek Guide w/Device Kit 1 kit by Does not apply route 4 (four) times daily.   Accu-Chek Softclix Lancets lancets 1 each by Other route 4 (four) times daily.   aspirin  EC 81 MG tablet Take 2 tablets (162 mg total) by mouth daily. Take after 12 weeks for prevention of preeclampsia later in pregnancy   Blood Glucose Test Strips 333 Strp 1 strip by In Vitro route 4 (four) times daily.   docusate sodium  100 MG capsule Commonly known as: Colace Take 1 capsule (100 mg total) by mouth  daily as needed for mild constipation.   NIFEdipine  30 MG 24 hr tablet Commonly known as: PROCARDIA -XL/NIFEDICAL-XL Take 1 tablet (30 mg total) by mouth in the morning and at bedtime. What changed: when to take this   NIFEdipine  30 MG 24 hr tablet Commonly known as: ADALAT  CC Take 1 tablet (30 mg total) by mouth 2 (two) times daily. What changed: You were already taking a medication with the same name, and this prescription was added. Make sure you understand how and when to take each.   PNV Prenatal Plus Multivit+DHA 27-1 & 312 MG Misc Take 1 tablet by mouth daily.        Follow-up Information     Center for [facility] Follow up on 09/21/2023.   Specialty: Obstetrics and Gynecology Why: as already scheduled Contact information: [address] [place]  [address] [phone]                Signed: Wendelyn Halter 09/20/2023, 9:31 AM

## 2023-09-21 ENCOUNTER — Ambulatory Visit

## 2023-09-21 DIAGNOSIS — O10913 Unspecified pre-existing hypertension complicating pregnancy, third trimester: Secondary | ICD-10-CM | POA: Diagnosis not present

## 2023-09-21 DIAGNOSIS — Z3A35 35 weeks gestation of pregnancy: Secondary | ICD-10-CM | POA: Diagnosis not present

## 2023-09-21 DIAGNOSIS — O10919 Unspecified pre-existing hypertension complicating pregnancy, unspecified trimester: Secondary | ICD-10-CM

## 2023-09-21 LAB — GC/CHLAMYDIA PROBE AMP (~~LOC~~) NOT AT ARMC
Chlamydia: NEGATIVE
Comment: NEGATIVE
Comment: NORMAL
Neisseria Gonorrhea: NEGATIVE

## 2023-09-22 ENCOUNTER — Ambulatory Visit: Admitting: Obstetrics and Gynecology

## 2023-09-22 ENCOUNTER — Encounter: Payer: Self-pay | Admitting: Obstetrics and Gynecology

## 2023-09-22 VITALS — BP 123/80 | HR 98 | Wt 239.1 lb

## 2023-09-22 DIAGNOSIS — D563 Thalassemia minor: Secondary | ICD-10-CM

## 2023-09-22 DIAGNOSIS — Z3A35 35 weeks gestation of pregnancy: Secondary | ICD-10-CM

## 2023-09-22 DIAGNOSIS — O26893 Other specified pregnancy related conditions, third trimester: Secondary | ICD-10-CM

## 2023-09-22 DIAGNOSIS — Z6791 Unspecified blood type, Rh negative: Secondary | ICD-10-CM

## 2023-09-22 DIAGNOSIS — O10913 Unspecified pre-existing hypertension complicating pregnancy, third trimester: Secondary | ICD-10-CM | POA: Diagnosis not present

## 2023-09-22 DIAGNOSIS — O24410 Gestational diabetes mellitus in pregnancy, diet controlled: Secondary | ICD-10-CM | POA: Diagnosis not present

## 2023-09-22 DIAGNOSIS — O3660X0 Maternal care for excessive fetal growth, unspecified trimester, not applicable or unspecified: Secondary | ICD-10-CM

## 2023-09-22 DIAGNOSIS — O99210 Obesity complicating pregnancy, unspecified trimester: Secondary | ICD-10-CM

## 2023-09-22 DIAGNOSIS — O0993 Supervision of high risk pregnancy, unspecified, third trimester: Secondary | ICD-10-CM

## 2023-09-22 DIAGNOSIS — Z8759 Personal history of other complications of pregnancy, childbirth and the puerperium: Secondary | ICD-10-CM | POA: Diagnosis not present

## 2023-09-22 DIAGNOSIS — O10919 Unspecified pre-existing hypertension complicating pregnancy, unspecified trimester: Secondary | ICD-10-CM

## 2023-09-22 DIAGNOSIS — Z8719 Personal history of other diseases of the digestive system: Secondary | ICD-10-CM

## 2023-09-22 DIAGNOSIS — O0990 Supervision of high risk pregnancy, unspecified, unspecified trimester: Secondary | ICD-10-CM

## 2023-09-22 DIAGNOSIS — O99213 Obesity complicating pregnancy, third trimester: Secondary | ICD-10-CM

## 2023-09-22 MED ORDER — ACETAMINOPHEN 500 MG PO TABS: 500.0000 mg | ORAL_TABLET | Freq: Three times a day (TID) | ORAL | 0 refills | Status: DC | PRN

## 2023-09-22 NOTE — Progress Notes (Signed)
   PRENATAL VISIT NOTE  Subjective:  Amanda Ellison is a 33 y.o. G3P2002 at [redacted]w[redacted]d being seen today for ongoing prenatal care.  She is currently monitored for the following issues for this high-risk pregnancy and has Rh negative, antepartum; Gestational diabetes mellitus; History of gestational diabetes; History of cholestasis during pregnancy; Prediabetes; Class 1 obesity due to excess calories with serious comorbidity in adult; Chronic hypertension affecting pregnancy; Supervision of high risk pregnancy, antepartum; Obesity affecting pregnancy, antepartum; Elevated hemoglobin A1c; Alpha thalassemia silent carrier; and LGA (large for gestational age) fetus affecting management of mother on their problem list.  Patient reports backache.  Contractions: Irritability. Vag. Bleeding: None.  Movement: Present. Denies leaking of fluid.   The following portions of the patient's history were reviewed and updated as appropriate: allergies, current medications, past family history, past medical history, past social history, past surgical history and problem list.   Objective:    Vitals:   09/22/23 1432  BP: 123/80  Pulse: 98  Weight: 239 lb 1.6 oz (108.5 kg)   Fetal Status:  Fetal Heart Rate (bpm): 155   Movement: Present    General: Alert, oriented and cooperative. Patient is in no acute distress.  Skin: Skin is warm and dry. No rash noted.   Cardiovascular: Normal heart rate noted  Respiratory: Normal respiratory effort, no problems with respiration noted  Abdomen: Soft, gravid, appropriate for gestational age.  Pain/Pressure: Present     Pelvic: Cervical exam deferred        Extremities: Normal range of motion.  Edema: None  Mental Status: Normal mood and affect. Normal behavior. Normal judgment and thought content.   Assessment and Plan:  Pregnancy: G3P2002 at [redacted]w[redacted]d  1. Chronic hypertension affecting pregnancy (Primary) Cont procardia  30 mg BID MFM rec delivery 37-38 weeks, will  request today  2. Diet controlled gestational diabetes mellitus (GDM) in third trimester Does not have log Reports fasting today 90 After breakfast 100s, after lunch, 110s  3. Rh negative, antepartum  4. History of cholestasis during pregnancy  5. Supervision of high risk pregnancy, antepartum  6. Obesity affecting pregnancy, antepartum, unspecified obesity type Cont baby aspirin   7. Excessive fetal growth affecting management of pregnancy, antepartum, single or unspecified fetus 95%tile last visit  8. Alpha thalassemia silent carrier  9. [redacted] weeks gestation of pregnancy   Preterm labor symptoms and general obstetric precautions including but not limited to vaginal bleeding, contractions, leaking of fluid and fetal movement were reviewed in detail with the patient. Please refer to After Visit Summary for other counseling recommendations.   Return in about 1 week (around 09/29/2023) for high OB, 36 week swabs.  Future Appointments  Date Time Provider Department Center  09/29/2023  2:00 PM [facility] PROVIDER 1 [facility]  09/29/2023  2:30 PM [facility] US3 [facility]  09/29/2023  3:35 PM Jan Mcgill, MD [facility]  10/04/2023  7:00 AM [facility] SCHED ROOM [facility] None  10/15/2023  2:35 PM Zelma Hidden, FNP [facility]  10/23/2023 10:55 AM Zelma Hidden, FNP [facility]    Jan Mcgill, MD

## 2023-09-29 ENCOUNTER — Ambulatory Visit: Attending: Obstetrics and Gynecology | Admitting: Obstetrics and Gynecology

## 2023-09-29 ENCOUNTER — Ambulatory Visit

## 2023-09-29 ENCOUNTER — Other Ambulatory Visit (HOSPITAL_COMMUNITY)
Admission: RE | Admit: 2023-09-29 | Discharge: 2023-09-29 | Disposition: A | Discharge status: Home / Self Care | Source: Ambulatory Visit | Attending: Obstetrics and Gynecology | Admitting: Obstetrics and Gynecology

## 2023-09-29 ENCOUNTER — Ambulatory Visit (INDEPENDENT_AMBULATORY_CARE_PROVIDER_SITE_OTHER): Admitting: Obstetrics and Gynecology

## 2023-09-29 ENCOUNTER — Encounter: Payer: Self-pay | Admitting: Obstetrics and Gynecology

## 2023-09-29 VITALS — BP 150/73 | HR 79 | Wt 232.2 lb

## 2023-09-29 DIAGNOSIS — Z3A36 36 weeks gestation of pregnancy: Secondary | ICD-10-CM | POA: Diagnosis not present

## 2023-09-29 DIAGNOSIS — E669 Obesity, unspecified: Secondary | ICD-10-CM

## 2023-09-29 DIAGNOSIS — Z8719 Personal history of other diseases of the digestive system: Secondary | ICD-10-CM

## 2023-09-29 DIAGNOSIS — O99213 Obesity complicating pregnancy, third trimester: Secondary | ICD-10-CM | POA: Insufficient documentation

## 2023-09-29 DIAGNOSIS — Z8632 Personal history of gestational diabetes: Secondary | ICD-10-CM

## 2023-09-29 DIAGNOSIS — O26893 Other specified pregnancy related conditions, third trimester: Secondary | ICD-10-CM | POA: Diagnosis not present

## 2023-09-29 DIAGNOSIS — O09293 Supervision of pregnancy with other poor reproductive or obstetric history, third trimester: Secondary | ICD-10-CM | POA: Diagnosis not present

## 2023-09-29 DIAGNOSIS — O24410 Gestational diabetes mellitus in pregnancy, diet controlled: Secondary | ICD-10-CM

## 2023-09-29 DIAGNOSIS — D563 Thalassemia minor: Secondary | ICD-10-CM

## 2023-09-29 DIAGNOSIS — Z6791 Unspecified blood type, Rh negative: Secondary | ICD-10-CM

## 2023-09-29 DIAGNOSIS — O10013 Pre-existing essential hypertension complicating pregnancy, third trimester: Secondary | ICD-10-CM

## 2023-09-29 DIAGNOSIS — O3660X0 Maternal care for excessive fetal growth, unspecified trimester, not applicable or unspecified: Secondary | ICD-10-CM

## 2023-09-29 DIAGNOSIS — E66811 Obesity, class 1: Secondary | ICD-10-CM

## 2023-09-29 DIAGNOSIS — O3663X0 Maternal care for excessive fetal growth, third trimester, not applicable or unspecified: Secondary | ICD-10-CM

## 2023-09-29 DIAGNOSIS — O10913 Unspecified pre-existing hypertension complicating pregnancy, third trimester: Secondary | ICD-10-CM | POA: Diagnosis not present

## 2023-09-29 DIAGNOSIS — O360130 Maternal care for anti-D [Rh] antibodies, third trimester, not applicable or unspecified: Secondary | ICD-10-CM

## 2023-09-29 DIAGNOSIS — O0990 Supervision of high risk pregnancy, unspecified, unspecified trimester: Secondary | ICD-10-CM

## 2023-09-29 DIAGNOSIS — E6609 Other obesity due to excess calories: Secondary | ICD-10-CM

## 2023-09-29 DIAGNOSIS — O10919 Unspecified pre-existing hypertension complicating pregnancy, unspecified trimester: Secondary | ICD-10-CM

## 2023-09-29 NOTE — Progress Notes (Signed)
   PRENATAL VISIT NOTE  Subjective:  Amanda Ellison is a 33 y.o. G3P2002 at [redacted]w[redacted]d being seen today for ongoing prenatal care.  She is currently monitored for the following issues for this high-risk pregnancy and has Rh negative, antepartum; Gestational diabetes mellitus; History of gestational diabetes; History of cholestasis during pregnancy; Prediabetes; Class 1 obesity due to excess calories with serious comorbidity in adult; Chronic hypertension affecting pregnancy; Supervision of high risk pregnancy, antepartum; Obesity affecting pregnancy, antepartum; Elevated hemoglobin A1c; Alpha thalassemia silent carrier; and LGA (large for gestational age) fetus affecting management of mother on their problem list.  Patient reports no complaints.  Contractions: Not present. Vag. Bleeding: None.  Movement: Present. Denies leaking of fluid.   The following portions of the patient's history were reviewed and updated as appropriate: allergies, current medications, past family history, past medical history, past social history, past surgical history and problem list.   Objective:    Vitals:   09/29/23 1548  BP: (!) 150/73  Pulse: 79  Weight: 232 lb 3.2 oz (105.3 kg)    Fetal Status:  Fetal Heart Rate (bpm): 156   Movement: Present    General: Alert, oriented and cooperative. Patient is in no acute distress.  Skin: Skin is warm and dry. No rash noted.   Cardiovascular: Normal heart rate noted  Respiratory: Normal respiratory effort, no problems with respiration noted  Abdomen: Soft, gravid, appropriate for gestational age.  Pain/Pressure: Absent     Pelvic: Cervical exam deferred        Extremities: Normal range of motion.  Edema: None  Mental Status: Normal mood and affect. Normal behavior. Normal judgment and thought content.   Assessment and Plan:  Pregnancy: G3P2002 at 105w2d  1. Diet controlled gestational diabetes mellitus (GDM) in third trimester (Primary) Does not have logs Fasting  ~91, after meals 110s  2. Chronic hypertension affecting pregnancy Cont procardia  30 mg BID  3. Rh negative, antepartum  4. History of gestational diabetes  5. History of cholestasis during pregnancy  6. Class 1 obesity due to excess calories with serious comorbidity in adult, unspecified BMI Cont baby aspirin   7. Supervision of high risk pregnancy, antepartum GC/CT today Group B strep today Has IOL scheduled for 10/04/23  8. Excessive fetal growth affecting management of pregnancy, antepartum, single or unspecified fetus Growth today 97%tile  9. Alpha thalassemia silent carrier  10. [redacted] weeks gestation of pregnancy   Preterm labor symptoms and general obstetric precautions including but not limited to vaginal bleeding, contractions, leaking of fluid and fetal movement were reviewed in detail with the patient. Please refer to After Visit Summary for other counseling recommendations.   Return in about 1 month (around 10/30/2023) for post partum check.  Future Appointments  Date Time Provider Department Center  10/04/2023  7:00 AM [facility]-LD SCHED ROOM [facility]IN[facility] None  10/15/2023  2:35 PM Zelma Hidden, FNP [facility]  10/23/2023 10:55 AM Zelma Hidden, FNP [facility]    Jan Mcgill, MD

## 2023-09-29 NOTE — Progress Notes (Signed)
 Maternal-Fetal Medicine Consultation Name: Amanda Ellison MRN: [id number]  G3 P2002 at 36w 2d gestation.  Patient is here for fetal growth assessment and antenatal testing. - Gestational diabetes.  Reportedly well-controlled on diet.  Patient has not brought her logbook but reports her fasting level is around 96 mg/dL and is unclear about postprandial levels. -Chronic hypertension.  Patient takes nifedipine  and her blood pressure today at our office is 126/69 mmHg.  Ultrasound On today's ultrasound, the estimated fetal weight is at the 97 percentile and the abdominal circumference measurement at the 99th percentile.  Amniotic fluid is normal and good fetal activity seen.  Cephalic presentation.  Antenatal testing is reassuring.  BPP 8/8.  I discussed ultrasound findings and explained the limitations of ultrasound and accurately estimating fetal weights.  Large for gestational age fetus can also result from suboptimal control of diabetes. Patient is scheduled to undergo induction of labor at [redacted] weeks gestation.   Consultation including face-to-face (more than 50%) counseling 10 minutes.

## 2023-09-30 LAB — CERVICOVAGINAL ANCILLARY ONLY
Chlamydia: NEGATIVE
Comment: NEGATIVE
Comment: NORMAL
Neisseria Gonorrhea: NEGATIVE

## 2023-10-03 LAB — CULTURE, BETA STREP (GROUP B ONLY): Strep Gp B Culture: POSITIVE — AB

## 2023-10-04 ENCOUNTER — Other Ambulatory Visit: Payer: Self-pay

## 2023-10-04 ENCOUNTER — Inpatient Hospital Stay (HOSPITAL_COMMUNITY)
Admission: AD | Admit: 2023-10-04 | Discharge: 2023-10-07 | DRG: 806 | Disposition: A | Discharge status: Home / Self Care | Payer: Self-pay | Attending: Obstetrics and Gynecology | Admitting: Obstetrics and Gynecology

## 2023-10-04 ENCOUNTER — Inpatient Hospital Stay (HOSPITAL_COMMUNITY)

## 2023-10-04 ENCOUNTER — Encounter (HOSPITAL_COMMUNITY): Payer: Self-pay | Admitting: Obstetrics and Gynecology

## 2023-10-04 ENCOUNTER — Inpatient Hospital Stay (HOSPITAL_COMMUNITY): Admitting: Anesthesiology

## 2023-10-04 DIAGNOSIS — E66811 Obesity, class 1: Secondary | ICD-10-CM | POA: Diagnosis present

## 2023-10-04 DIAGNOSIS — Z3A37 37 weeks gestation of pregnancy: Secondary | ICD-10-CM | POA: Diagnosis not present

## 2023-10-04 DIAGNOSIS — O26893 Other specified pregnancy related conditions, third trimester: Secondary | ICD-10-CM | POA: Diagnosis present

## 2023-10-04 DIAGNOSIS — K2100 Gastro-esophageal reflux disease with esophagitis, without bleeding: Secondary | ICD-10-CM | POA: Diagnosis present

## 2023-10-04 DIAGNOSIS — O99214 Obesity complicating childbirth: Secondary | ICD-10-CM | POA: Diagnosis present

## 2023-10-04 DIAGNOSIS — O1002 Pre-existing essential hypertension complicating childbirth: Secondary | ICD-10-CM | POA: Diagnosis not present

## 2023-10-04 DIAGNOSIS — O99820 Streptococcus B carrier state complicating pregnancy: Secondary | ICD-10-CM | POA: Diagnosis not present

## 2023-10-04 DIAGNOSIS — O3663X0 Maternal care for excessive fetal growth, third trimester, not applicable or unspecified: Secondary | ICD-10-CM | POA: Diagnosis present

## 2023-10-04 DIAGNOSIS — O24420 Gestational diabetes mellitus in childbirth, diet controlled: Principal | ICD-10-CM | POA: Diagnosis present

## 2023-10-04 DIAGNOSIS — O99824 Streptococcus B carrier state complicating childbirth: Secondary | ICD-10-CM | POA: Diagnosis present

## 2023-10-04 DIAGNOSIS — O10919 Unspecified pre-existing hypertension complicating pregnancy, unspecified trimester: Secondary | ICD-10-CM

## 2023-10-04 DIAGNOSIS — O9962 Diseases of the digestive system complicating childbirth: Secondary | ICD-10-CM | POA: Diagnosis present

## 2023-10-04 DIAGNOSIS — Z148 Genetic carrier of other disease: Secondary | ICD-10-CM | POA: Diagnosis not present

## 2023-10-04 DIAGNOSIS — O1092 Unspecified pre-existing hypertension complicating childbirth: Secondary | ICD-10-CM | POA: Diagnosis present

## 2023-10-04 DIAGNOSIS — Z6791 Unspecified blood type, Rh negative: Secondary | ICD-10-CM | POA: Diagnosis not present

## 2023-10-04 DIAGNOSIS — E1165 Type 2 diabetes mellitus with hyperglycemia: Principal | ICD-10-CM | POA: Diagnosis present

## 2023-10-04 LAB — PROTEIN / CREATININE RATIO, URINE
Creatinine, Urine: 77 mg/dL
Protein Creatinine Ratio: 0.12 mg/mg{creat} (ref 0.00–0.15)
Total Protein, Urine: 9 mg/dL

## 2023-10-04 LAB — CBC
HCT: 32.3 % — ABNORMAL LOW (ref 36.0–46.0)
HCT: 32.5 % — ABNORMAL LOW (ref 36.0–46.0)
Hemoglobin: 10 g/dL — ABNORMAL LOW (ref 12.0–15.0)
Hemoglobin: 10.1 g/dL — ABNORMAL LOW (ref 12.0–15.0)
MCH: 24.3 pg — ABNORMAL LOW (ref 26.0–34.0)
MCH: 24.4 pg — ABNORMAL LOW (ref 26.0–34.0)
MCHC: 31 g/dL (ref 30.0–36.0)
MCHC: 31.1 g/dL (ref 30.0–36.0)
MCV: 78.4 fL — ABNORMAL LOW (ref 80.0–100.0)
MCV: 78.5 fL — ABNORMAL LOW (ref 80.0–100.0)
Platelets: 351 10*3/uL (ref 150–400)
Platelets: 338 10*3/uL (ref 150–400)
RBC: 4.12 MIL/uL (ref 3.87–5.11)
RBC: 4.14 MIL/uL (ref 3.87–5.11)
RDW: 17.2 % — ABNORMAL HIGH (ref 11.5–15.5)
RDW: 17.3 % — ABNORMAL HIGH (ref 11.5–15.5)
WBC: 6.7 10*3/uL (ref 4.0–10.5)
WBC: 8.7 10*3/uL (ref 4.0–10.5)
nRBC: 0.3 % — ABNORMAL HIGH (ref 0.0–0.2)
nRBC: 0 % (ref 0.0–0.2)

## 2023-10-04 LAB — COMPREHENSIVE METABOLIC PANEL WITH GFR
ALT: 25 U/L (ref 0–44)
AST: 37 U/L (ref 15–41)
Albumin: 2.8 g/dL — ABNORMAL LOW (ref 3.5–5.0)
Alkaline Phosphatase: 93 U/L (ref 38–126)
Anion gap: 8 (ref 5–15)
BUN: 7 mg/dL (ref 6–20)
CO2: 21 mmol/L — ABNORMAL LOW (ref 22–32)
Calcium: 9.1 mg/dL (ref 8.9–10.3)
Chloride: 103 mmol/L (ref 98–111)
Creatinine, Ser: 0.55 mg/dL (ref 0.44–1.00)
GFR, Estimated: >60 mL/min (ref >60)
Glucose, Bld: 114 mg/dL — ABNORMAL HIGH (ref 70–99)
Potassium: 4.5 mmol/L (ref 3.5–5.1)
Sodium: 132 mmol/L — ABNORMAL LOW (ref 135–145)
Total Bilirubin: 0.9 mg/dL (ref 0.0–1.2)
Total Protein: 6.1 g/dL — ABNORMAL LOW (ref 6.5–8.1)

## 2023-10-04 LAB — GLUCOSE, CAPILLARY
Glucose-Capillary: 108 mg/dL — ABNORMAL HIGH (ref 70–99)
Glucose-Capillary: 130 mg/dL — ABNORMAL HIGH (ref 70–99)
Glucose-Capillary: 85 mg/dL (ref 70–99)
Glucose-Capillary: 89 mg/dL (ref 70–99)
Glucose-Capillary: 92 mg/dL (ref 70–99)

## 2023-10-04 LAB — RPR: RPR Ser Ql: NONREACTIVE

## 2023-10-04 MED ORDER — MISOPROSTOL 50MCG HALF TABLET
50.0000 ug | ORAL_TABLET | Freq: Once | ORAL | Status: DC
  Filled 2023-10-04: qty 1

## 2023-10-04 MED ORDER — ACETAMINOPHEN 325 MG PO TABS
650.0000 mg | ORAL_TABLET | ORAL | Status: DC | PRN
  Administered 2023-10-05: 650 mg via ORAL
  Filled 2023-10-04: qty 2

## 2023-10-04 MED ORDER — DIPHENHYDRAMINE HCL 50 MG/ML IJ SOLN: 12.5000 mg | INTRAMUSCULAR | Status: DC | PRN

## 2023-10-04 MED ORDER — PHENYLEPHRINE 80 MCG/ML (10ML) SYRINGE FOR IV PUSH (FOR BLOOD PRESSURE SUPPORT): 80.0000 ug | PREFILLED_SYRINGE | INTRAVENOUS | Status: DC | PRN

## 2023-10-04 MED ORDER — PHENYLEPHRINE 80 MCG/ML (10ML) SYRINGE FOR IV PUSH (FOR BLOOD PRESSURE SUPPORT)
80.0000 ug | PREFILLED_SYRINGE | INTRAVENOUS | Status: DC | PRN
  Filled 2023-10-04: qty 10

## 2023-10-04 MED ORDER — NIFEDIPINE ER OSMOTIC RELEASE 30 MG PO TB24
30.0000 mg | ORAL_TABLET | Freq: Two times a day (BID) | ORAL | Status: DC
  Administered 2023-10-04 – 2023-10-07 (×5): 30 mg via ORAL
  Filled 2023-10-04 (×6): qty 1

## 2023-10-04 MED ORDER — SODIUM CHLORIDE 0.9 % IV SOLN
5.0000 10*6.[IU] | Freq: Once | INTRAVENOUS | Status: AC
  Administered 2023-10-04: 5 10*6.[IU] via INTRAVENOUS
  Filled 2023-10-04: qty 5

## 2023-10-04 MED ORDER — EPHEDRINE 5 MG/ML INJ: 10.0000 mg | INTRAVENOUS | Status: DC | PRN

## 2023-10-04 MED ORDER — OXYTOCIN-SODIUM CHLORIDE 30-0.9 UT/500ML-% IV SOLN: 2.5000 [IU]/h | INTRAVENOUS | Status: DC

## 2023-10-04 MED ORDER — ONDANSETRON HCL 4 MG/2ML IJ SOLN: 4.0000 mg | Freq: Four times a day (QID) | INTRAMUSCULAR | Status: DC | PRN

## 2023-10-04 MED ORDER — PENICILLIN G POT IN DEXTROSE 60000 UNIT/ML IV SOLN
3.0000 10*6.[IU] | INTRAVENOUS | Status: DC
  Administered 2023-10-04 (×3): 3 10*6.[IU] via INTRAVENOUS
  Filled 2023-10-04 (×6): qty 50

## 2023-10-04 MED ORDER — LIDOCAINE HCL (PF) 1 % IJ SOLN
30.0000 mL | INTRAMUSCULAR | Status: AC | PRN
  Administered 2023-10-05: 30 mL via SUBCUTANEOUS
  Filled 2023-10-04: qty 30

## 2023-10-04 MED ORDER — TERBUTALINE SULFATE 1 MG/ML IJ SOLN
0.2500 mg | Freq: Once | INTRAMUSCULAR | Status: AC | PRN
  Administered 2023-10-04: 0.25 mg via SUBCUTANEOUS
  Filled 2023-10-04: qty 1

## 2023-10-04 MED ORDER — MISOPROSTOL 50MCG HALF TABLET
50.0000 ug | ORAL_TABLET | Freq: Once | ORAL | Status: AC
  Administered 2023-10-04: 50 ug via BUCCAL

## 2023-10-04 MED ORDER — FENTANYL-BUPIVACAINE-NACL 0.5-0.125-0.9 MG/250ML-% EP SOLN
12.0000 mL/h | EPIDURAL | Status: DC | PRN
  Administered 2023-10-04: 12 mL/h via EPIDURAL
  Filled 2023-10-04: qty 250

## 2023-10-04 MED ORDER — LIDOCAINE HCL (PF) 1 % IJ SOLN
INTRAMUSCULAR | Status: DC | PRN
  Administered 2023-10-04 (×2): 5 mL via EPIDURAL

## 2023-10-04 MED ORDER — OXYTOCIN BOLUS FROM INFUSION
333.0000 mL | Freq: Once | INTRAVENOUS | Status: AC
  Administered 2023-10-05: 333 mL via INTRAVENOUS

## 2023-10-04 MED ORDER — LACTATED RINGERS IV SOLN: INTRAVENOUS | Status: DC

## 2023-10-04 MED ORDER — SOD CITRATE-CITRIC ACID 500-334 MG/5ML PO SOLN
30.0000 mL | ORAL | Status: DC | PRN
Start: 2023-10-04 — End: 2023-10-05

## 2023-10-04 MED ORDER — LACTATED RINGERS IV SOLN
500.0000 mL | INTRAVENOUS | Status: DC | PRN
  Administered 2023-10-04: 1000 mL via INTRAVENOUS

## 2023-10-04 MED ORDER — LACTATED RINGERS IV SOLN
500.0000 mL | Freq: Once | INTRAVENOUS | Status: AC
  Administered 2023-10-04: 500 mL via INTRAVENOUS

## 2023-10-04 NOTE — Progress Notes (Signed)
Abdominal binder applied.

## 2023-10-04 NOTE — Progress Notes (Signed)
 Abd binder applied

## 2023-10-04 NOTE — Progress Notes (Signed)
 Trystyn Marcelli is a 33 y.o. G3P2002 at [redacted]w[redacted]d.  Subjective: Comfortable with epidural.  Objective: BP 138/77   Pulse 89   Temp 98.4 F (36.9 C) (Oral)   Resp 16   Ht 5\' 9"  (1.753 m)   Wt 107.6 kg   LMP 01/02/2023 (Exact Date)   BMI 35.03 kg/m    FHT:  FHR: 150 bpm, variability: mod,  accelerations:  15x15,  decelerations:  none UC:   Q 1-3 minutes, mod Dilation: 3 Effacement (%): Thick Station: Ballotable Exam by:: Pressley Brome CNM Vtx by US   Labs: Results for orders placed or performed during the hospital encounter of 10/04/23 (from the past 24 hours)  Glucose, capillary     Status: Abnormal   Collection Time: 10/04/23  7:55 AM  Result Value Ref Range   Glucose-Capillary 130 (H) 70 - 99 mg/dL  Type and screen     Status: None   Collection Time: 10/04/23  8:09 AM  Result Value Ref Range   ABO/RH(D) O NEG    Antibody Screen POS    Sample Expiration 10/07/2023,2359    Antibody Identification      PASSIVELY ACQUIRED ANTI-D Performed at [facility], [address], [place], [place] [address]   CBC     Status: Abnormal   Collection Time: 10/04/23  8:12 AM  Result Value Ref Range   WBC 6.7 4.0 - 10.5 K/uL   RBC 4.12 3.87 - 5.11 MIL/uL   Hemoglobin 10.0 (L) 12.0 - 15.0 g/dL   HCT 19.1 (L) 47.8 - 29.5 %   MCV 78.4 (L) 80.0 - 100.0 fL   MCH 24.3 (L) 26.0 - 34.0 pg   MCHC 31.0 30.0 - 36.0 g/dL   RDW 62.1 (H) 30.8 - 65.7 %   Platelets 351 150 - 400 K/uL   nRBC 0.3 (H) 0.0 - 0.2 %  RPR     Status: None   Collection Time: 10/04/23  8:12 AM  Result Value Ref Range   RPR Ser Ql NON REACTIVE NON REACTIVE  Comprehensive metabolic panel     Status: Abnormal   Collection Time: 10/04/23  8:12 AM  Result Value Ref Range   Sodium 132 (L) 135 - 145 mmol/L   Potassium 4.5 3.5 - 5.1 mmol/L   Chloride 103 98 - 111 mmol/L   CO2 21 (L) 22 - 32 mmol/L   Glucose, Bld 114 (H) 70 - 99 mg/dL   BUN 7 6 - 20 mg/dL   Creatinine, Ser 8.46 0.44 - 1.00 mg/dL   Calcium  9.1 8.9 - 10.3  mg/dL   Total Protein 6.1 (L) 6.5 - 8.1 g/dL   Albumin 2.8 (L) 3.5 - 5.0 g/dL   AST 37 15 - 41 U/L   ALT 25 0 - 44 U/L   Alkaline Phosphatase 93 38 - 126 U/L   Total Bilirubin 0.9 0.0 - 1.2 mg/dL   GFR, Estimated >96 >29 mL/min   Anion gap 8 5 - 15  Glucose, capillary     Status: None   Collection Time: 10/04/23  1:02 PM  Result Value Ref Range   Glucose-Capillary 89 70 - 99 mg/dL  CBC     Status: Abnormal   Collection Time: 10/04/23  3:14 PM  Result Value Ref Range   WBC 8.7 4.0 - 10.5 K/uL   RBC 4.14 3.87 - 5.11 MIL/uL   Hemoglobin 10.1 (L) 12.0 - 15.0 g/dL   HCT 52.8 (L) 41.3 - 24.4 %   MCV  78.5 (L) 80.0 - 100.0 fL   MCH 24.4 (L) 26.0 - 34.0 pg   MCHC 31.1 30.0 - 36.0 g/dL   RDW 16.1 (H) 09.6 - 04.5 %   Platelets 338 150 - 400 K/uL   nRBC 0.0 0.0 - 0.2 %  Glucose, capillary     Status: None   Collection Time: 10/04/23  3:51 PM  Result Value Ref Range   Glucose-Capillary 92 70 - 99 mg/dL    Assessment / Plan: [redacted]w[redacted]d week IUP ROM: Intact  Labor: Early/IOL progressing well on Cytotec . Contracting frequently. Vtx not well applied enough to safely offer AROM. Consider AROM or pitocin  PRN at next check.  Fetal Wellbeing:  Category I Pain Control:  Epidural Anticipated MOD:  SVD CHTN: BP mildly elevated. Pre-blood work Nml. P:C pending. No pre-E Sx.  A1GDM: CBG's Nml off meds.   Felipe Horton, Ronette Hank , CNM 10/04/2023 5:37 PM

## 2023-10-04 NOTE — Anesthesia Procedure Notes (Signed)
 Epidural Patient location during procedure: OB Start time: 10/04/2023 3:58 PM End time: 10/04/2023 4:06 PM  Staffing Anesthesiologist: Tura Gaines, MD Performed: anesthesiologist   Preanesthetic Checklist Completed: patient identified, IV checked, site marked, risks and benefits discussed, surgical consent, monitors and equipment checked, pre-op evaluation and timeout performed  Epidural Patient position: sitting Prep: DuraPrep and site prepped and draped Patient monitoring: continuous pulse ox and blood pressure Approach: midline Location: L3-L4 Injection technique: LOR air  Needle:  Needle type: Tuohy  Needle gauge: 17 G Needle length: 9 cm and 9 Needle insertion depth: 6 cm Catheter type: closed end flexible Catheter size: 19 Gauge Catheter at skin depth: 11 cm Test dose: negative and Other  Assessment Events: blood not aspirated, no cerebrospinal fluid, injection not painful, no injection resistance, no paresthesia and negative IV test  Additional Notes Patient identified. Risks and benefits discussed including failed block, incomplete  Pain control, post dural puncture headache, nerve damage, paralysis, blood pressure Changes, nausea, vomiting, reactions to medications-both toxic and allergic and post Partum back pain. All questions were answered. Patient expressed understanding and wished to proceed. Sterile technique was used throughout procedure. Epidural site was Dressed with sterile barrier dressing. No paresthesias, signs of intravascular injection Or signs of intrathecal spread were encountered.  Patient was more comfortable after the epidural was dosed. Please see RN's note for documentation of vital signs and FHR which are stable. Reason for block:procedure for pain

## 2023-10-04 NOTE — H&P (Cosign Needed Addendum)
 OBSTETRIC ADMISSION HISTORY AND PHYSICAL  Amanda Ellison is a 33 y.o. female G16P2002 with IUP at [redacted]w[redacted]d by US  presenting for IOL due to uncontrolled GDM and chronic HTN. She reports +FMs, No LOF, no VB, no blurry vision, headaches or peripheral edema, and RUQ pain. She plans on breastfeeding. Birth control undecided. She received her prenatal care at [facility].   Dating: By US  @ 7w 3d --->  Estimated Date of Delivery: 10/25/23  Sono: @[redacted]w[redacted]d , CWD, normal anatomy, cephalic presentation, 3559 gm 7 lb 14 oz 97 %    NURSING  PROVIDER  [facility] Dating by U/S at 7 wks  [facility] Anatomy U/S Scheduled 06/01/23  Initiated care at  [facility]                Language  English              LAB RESULTS   Support Person  Genetics NIPS: LR Female AFP: Low risk    NT/IT (FT only)     Carrier Screen Horizon: Silent carrier alpha thal  [ ] Partner test  Rhogam  --/--/O NEG (12/01 0331) A1C/GTT Early:  Third trimester:   Flu Vaccine     TDaP Vaccine  08/12/2023 Blood Type --/--/O NEG (12/01 0331)  Covid Vaccine  Antibody Negative (04/09 0822)  RSV Vaccine  Rubella 20.50 (11/25 1531)  Feeding Plan breast RPR Non Reactive (04/09 2130)  Contraception Yes- undecided HBsAg Negative (11/25 1531)  Circumcision  HIV Non Reactive (04/09 8657)  Pediatrician  [facility] HCVAb Non Reactive (11/25 1531)  Prenatal Classes     BTL Consent  Pap Diagnosis  Date Value Ref Range Status  03/30/2023   Final   - Negative for intraepithelial lesion or malignancy (NILM)    BTL Pre-payment  GC/CT Initial:   36wks:    VBAC Consent NA GBS   For PCN allergy, check sensitivities        DME Rx [ ]  BP cuff [ ]  Weight Scale Waterbirth  [ ]  Class [ ]  Consent [ ]  CNM visit  PHQ9 & GAD7 [  ] new OB [  ] 28 weeks  [  ] 36 weeks Induction  [ ]  Orders Entered [ ] Foley Y/N    Prenatal History/Complications: chronic hypertension, GDM, LGA fetus, Rh neg  Past  Medical History: Past Medical History:  Diagnosis Date   Anemia 04/24/2016   Cholelithiasis affecting pregnancy, antepartum    Gestational diabetes    Hypertension    Prediabetes 02/14/2020   Last A1C 6.0 (05/27/2018)     Past Surgical History: No past surgical history on file.    Obstetrical History: OB History     Gravida  3   Para  2   Term  2   Preterm  0   AB  0   Living  2      SAB  0   IAB  0   Ectopic  0   Multiple  0   Live Births  2           Social History Social History   Socioeconomic History   Marital status: Married    Spouse name: Amanda Ellison     Number of children: 1   Years of education: Not on file   Highest education level: Not on file  Occupational History   Occupation: Housekeeping  Tobacco Use   Smoking status: Never   Smokeless tobacco: Never  Vaping Use   Vaping status: Never Used  Substance and Sexual Activity   Alcohol use: No   Drug use: No   Sexual activity: Yes    Partners: Male    Birth control/protection: None  Other Topics Concern   Not on file  Social History Narrative   Not on file   Social Drivers of Health   Financial Resource Strain: Not on file  Food Insecurity: No Food Insecurity (10/04/2023)   Hunger Vital Sign    Worried About Running Out of Food in the Last Year: Never true    Ran Out of Food in the Last Year: Never true  Transportation Needs: No Transportation Needs (10/04/2023)   PRAPARE - Administrator, Civil Service (Medical): No    Lack of Transportation (Non-Medical): No  Physical Activity: Not on file  Stress: Not on file (06/14/2019)  Social Connections: Not on file   Family History: Family History  Problem Relation Age of Onset   Other Mother        sick one week and died, unknown cause   Other Father        died from car accident   Alcohol abuse Neg Hx    Arthritis Neg Hx    Asthma Neg Hx    Diabetes Neg Hx    Drug abuse Neg Hx    Early death Neg Hx     Birth defects Neg Hx    Cancer Neg Hx    COPD Neg Hx    Depression Neg Hx    Hearing loss Neg Hx    Heart disease Neg Hx    Hyperlipidemia Neg Hx    Hypertension Neg Hx    Kidney disease Neg Hx    Learning disabilities Neg Hx    Mental illness Neg Hx    Mental retardation Neg Hx    Miscarriages / Stillbirths Neg Hx    Stroke Neg Hx    Vision loss Neg Hx    Varicose Veins Neg Hx    Allergies: No Known Allergies  Medications Prior to Admission  Medication Sig Dispense Refill Last Dose/Taking   Accu-Chek Softclix Lancets lancets 1 each by Other route 4 (four) times daily. 100 each 12    acetaminophen  (TYLENOL ) 500 MG tablet Take 1 tablet (500 mg total) by mouth every 8 (eight) hours as needed. 30 tablet 0    aspirin  EC 81 MG tablet Take 2 tablets (162 mg total) by mouth daily. Take after 12 weeks for prevention of preeclampsia later in pregnancy (Patient not taking: Reported on 09/29/2023) 300 tablet 2    Blood Glucose Monitoring Suppl (ACCU-CHEK GUIDE) w/Device KIT 1 kit by Does not apply route 4 (four) times daily. 1 kit 0    docusate sodium  (COLACE) 100 MG capsule Take 1 capsule (100 mg total) by mouth daily as needed for mild constipation. (Patient not taking: Reported on 09/29/2023) 30 capsule 9    Glucose Blood (BLOOD GLUCOSE TEST STRIPS 333) STRP 1 strip by In Vitro route 4 (four) times daily. 100 strip 12    NIFEdipine  (ADALAT  CC) 30 MG 24 hr tablet Take 1 tablet (30 mg total) by mouth 2 (two) times daily. 60 tablet 2    NIFEdipine  (PROCARDIA -XL/NIFEDICAL-XL) 30 MG 24 hr tablet Take 1 tablet (30 mg total) by mouth in the morning and at bedtime. 60 tablet 2    Prenatal Vit-Fe Fum-FA-Omega (PNV PRENATAL PLUS MULTIVIT+DHA) 27-1 & 312 MG MISC Take 1 tablet by  mouth daily. 30 each 12      Review of Systems   All systems reviewed and negative except as stated in HPI  Blood pressure 138/68, pulse (!) 105, temperature 99 F (37.2 C), temperature source Oral, resp. rate 16, height 5'  9" (1.753 m), weight 107.6 kg, last menstrual period 01/02/2023, unknown if currently breastfeeding. General appearance: alert, cooperative, and no distress Lungs: respiratory rate and effort normal Heart: regular rate Abdomen: soft, non-tender Pelvic: SVE, cervix closed and posterior Extremities: no sign of DVT Presentation: transverse; Eure, MD called Fetal monitoring: baseline 155, variability: moderate, accels: present, decels: absent Uterine activity: None   Prenatal labs: ABO, Rh: --/--/PENDING (06/01 0809) Antibody: PENDING (06/01 0809) Rubella: 20.50 (11/25 1531) RPR: Non Reactive (04/09 0822)  HBsAg: Negative (11/25 1531)  HIV: Non Reactive (04/09 0822)  GBS: Positive/-- (05/28 4098)    Lab Results  Component Value Date   GBS Positive (A) 09/30/2023   GTT: fasting 110, 1hr 254, 2hr 138 (08/12/23) Genetic screening: low risk NIPS, silent carrier alpha thal (03/30/23) Anatomy US : normal at [redacted]w[redacted]d (05/31/22)  Immunization History  Administered Date(s) Administered   Influenza,inj,Quad PF,6+ Mos 12/25/2014, 02/14/2020   Tdap 12/04/2014, 08/12/2023    Prenatal Transfer Tool  Maternal Diabetes: Yes:  Diabetes Type:  Diet controlled Genetic Screening: Normal Maternal Ultrasounds/Referrals: Normal; EFW 97% Fetal Ultrasounds or other Referrals:  Referred to Materal Fetal Medicine  Maternal Substance Abuse:  No Significant Maternal Medications:  None Significant Maternal Lab Results: Group B Strep positive and Rh negative Number of Prenatal Visits:greater than 3 verified prenatal visits Maternal Vaccinations:TDap Other Comments:  suspect uncontrolled GDM   Results for orders placed or performed during the hospital encounter of 10/04/23 (from the past 24 hours)  Glucose, capillary   Collection Time: 10/04/23  7:55 AM  Result Value Ref Range   Glucose-Capillary 130 (H) 70 - 99 mg/dL  Type and screen   Collection Time: 10/04/23  8:09 AM  Result Value Ref Range    ABO/RH(D) PENDING    Antibody Screen PENDING    Sample Expiration      10/07/2023,2359 Performed at [facility], [address] [place], [place] [address]     Patient Active Problem List   Diagnosis Date Noted   Uncontrolled diabetes mellitus with hyperglycemia (HCC) 10/04/2023   LGA (large for gestational age) fetus affecting management of mother 08/12/2023   Obesity affecting pregnancy, antepartum 05/25/2023   Alpha thalassemia silent carrier 05/25/2023   Supervision of high risk pregnancy, antepartum 03/23/2023   Chronic hypertension affecting pregnancy 03/13/2023   Class 1 obesity due to excess calories with serious comorbidity in adult 02/14/2020   History of cholestasis during pregnancy 10/26/2019   Gestational diabetes mellitus 12/26/2014   Rh negative, antepartum 10/22/2014    Assessment/Plan:  Amanda Ellison is a 33 y.o. G3P2002 at [redacted]w[redacted]d here for IOL for uncontrolled GDM and chronic HTN.  #Labor: transverse presentation, Dr. Randolm Butte called to bedside for possible version, will begin with cytotec  if successful version #Pain: resting comfortably #FWB: Cat 1 #GBS status: positive, PCN ordered #Feeding: Breastmilk  #Reproductive Life planning: Not Discussed #Circ: not applicable (female)  Fonda Hymen, Student-MidWife  10/04/2023, 8:40 AM  I was present for the exam and agree with above.  Felipe Horton Rayme Bui , CNM 10/04/2023 12:10 PM

## 2023-10-04 NOTE — Progress Notes (Addendum)
 Amanda Ellison is a 33 y.o. G3P2002 at [redacted]w[redacted]d.  Subjective: Mild cramping  Objective: BP 134/76   Pulse 97   Temp 98.4 F (36.9 C) (Oral)   Resp 18   Ht 5\' 9"  (1.753 m)   Wt 107.6 kg   LMP 01/02/2023 (Exact Date)   BMI 35.03 kg/m    FHT:  FHR: 150 bpm, variability: min-mod,  accelerations:  10x10,  decelerations:  none UC:   Q 2-5 minutes, mild VE deferred VTX per US   Labs: Results for orders placed or performed during the hospital encounter of 10/04/23 (from the past 24 hours)  Glucose, capillary     Status: Abnormal   Collection Time: 10/04/23  7:55 AM  Result Value Ref Range   Glucose-Capillary 130 (H) 70 - 99 mg/dL  Type and screen     Status: None   Collection Time: 10/04/23  8:09 AM  Result Value Ref Range   ABO/RH(D) O NEG    Antibody Screen POS    Sample Expiration 10/07/2023,2359    Antibody Identification      PASSIVELY ACQUIRED ANTI-D Performed at [facility], [address] [place], [place] [address]   CBC     Status: Abnormal   Collection Time: 10/04/23  8:12 AM  Result Value Ref Range   WBC 6.7 4.0 - 10.5 K/uL   RBC 4.12 3.87 - 5.11 MIL/uL   Hemoglobin 10.0 (L) 12.0 - 15.0 g/dL   HCT 60.4 (L) 54.0 - 98.1 %   MCV 78.4 (L) 80.0 - 100.0 fL   MCH 24.3 (L) 26.0 - 34.0 pg   MCHC 31.0 30.0 - 36.0 g/dL   RDW 19.1 (H) 47.8 - 29.5 %   Platelets 351 150 - 400 K/uL   nRBC 0.3 (H) 0.0 - 0.2 %  RPR     Status: None   Collection Time: 10/04/23  8:12 AM  Result Value Ref Range   RPR Ser Ql NON REACTIVE NON REACTIVE  Comprehensive metabolic panel     Status: Abnormal   Collection Time: 10/04/23  8:12 AM  Result Value Ref Range   Sodium 132 (L) 135 - 145 mmol/L   Potassium 4.5 3.5 - 5.1 mmol/L   Chloride 103 98 - 111 mmol/L   CO2 21 (L) 22 - 32 mmol/L   Glucose, Bld 114 (H) 70 - 99 mg/dL   BUN 7 6 - 20 mg/dL   Creatinine, Ser 6.21 0.44 - 1.00 mg/dL   Calcium  9.1 8.9 - 10.3 mg/dL   Total Protein 6.1 (L) 6.5 - 8.1 g/dL   Albumin 2.8 (L) 3.5 -  5.0 g/dL   AST 37 15 - 41 U/L   ALT 25 0 - 44 U/L   Alkaline Phosphatase 93 38 - 126 U/L   Total Bilirubin 0.9 0.0 - 1.2 mg/dL   GFR, Estimated >30 >86 mL/min   Anion gap 8 5 - 15  Glucose, capillary     Status: None   Collection Time: 10/04/23  1:02 PM  Result Value Ref Range   Glucose-Capillary 89 70 - 99 mg/dL    Assessment / Plan: [redacted]w[redacted]d week IUP Intact Labor: Early, IOL. Cytotec  given. Consider foley, pitocin , AROM PRN Fetus Vtx after ECV: CTO fetal lie closely. Abd binding in place. Will AROM ASAP.  Fetal Wellbeing:  Category II, overall reassuring but predominantly minimal variability, but improving to moderate.   Pain Control:  Comfort measures.  Anticipated MOD:  SVD A1GDM: Mildly elevated followed by Nml.  Check CBGs Q4.  CHTN: blood work [employer]. No Pre-E Sx. P:C ordered  Pos passively acquired anti- D: Had been negative this pregnancy until 08/12/23 when Rhophylac  was given.    Felipe Horton, Hassaan Crite , CNM 10/04/2023 12:00 PM

## 2023-10-04 NOTE — Progress Notes (Signed)
 Pt instructed to stay on left side for 30-60 minutes and call RN if she needs to use restroom

## 2023-10-04 NOTE — Progress Notes (Signed)
 Amanda Ellison is a 33 y.o. G3P2002 at [redacted]w[redacted]d by ultrasound admitted for induction of labor due to uncontrolled A1GDM cHTN on Procardia  30mg  BID and LGA in the 97th %tile.   Subjective: Patient is doing well not that she has an epidural on board. FOB and children present at bedside and supportive.   Objective: BP (!) 113/53   Pulse 82   Temp 97.9 F (36.6 C) (Oral)   Resp 20   Ht 5\' 9"  (1.753 m)   Wt 107.6 kg   LMP 01/02/2023 (Exact Date)   BMI 35.03 kg/m  I/O last 3 completed shifts: In: -  Out: 600 [Urine:600] No intake/output data recorded.  FHT:  FHR: 150 bpm, variability: moderate,  accelerations:  Present,  decelerations:  Absent UC:   regular, every 2-3 minutes SVE:   Dilation: 4 Effacement (%): 90 Station: -1 Exam by:: Atha Lazar CNM  Labs: Lab Results  Component Value Date   WBC 8.7 10/04/2023   HGB 10.1 (L) 10/04/2023   HCT 32.5 (L) 10/04/2023   MCV 78.5 (L) 10/04/2023   PLT 338 10/04/2023  Patient Vitals for the past 24 hrs:  BP Temp Temp src Pulse Resp Height Weight  10/04/23 2136 -- 97.9 F (36.6 C) Oral -- -- -- --  10/04/23 2100 (!) 113/53 -- -- 82 -- -- --  10/04/23 2030 (!) 133/55 -- -- 93 -- -- --  10/04/23 2000 (!) 127/57 98.3 F (36.8 C) Oral 78 -- -- --  10/04/23 1930 (!) 126/52 -- -- 79 -- -- --  10/04/23 1900 135/60 -- -- 91 20 -- --  10/04/23 1832 (!) 136/49 -- -- 78 19 -- --  10/04/23 1800 118/77 -- -- 91 20 -- --  10/04/23 1731 138/77 -- -- 89 16 -- --  10/04/23 1702 138/81 -- -- 86 16 -- --  10/04/23 1631 (!) 140/72 -- -- 89 18 -- --  10/04/23 1626 134/79 -- -- 92 20 -- --  10/04/23 1624 (!) 141/74 -- -- 83 17 -- --  10/04/23 1619 137/86 -- -- 86 15 -- --  10/04/23 1612 132/75 -- -- 92 18 -- --  10/04/23 1607 138/73 -- -- 91 17 -- --  10/04/23 1603 137/82 -- -- 89 16 -- --  10/04/23 1600 137/72 -- -- 89 20 -- --  10/04/23 1501 112/88 -- -- 100 18 -- --  10/04/23 1402 (!) 141/83 -- -- 94 20 -- --  10/04/23 1256 134/76 -- -- 97  18 -- --  10/04/23 1157 (!) 144/81 98.4 F (36.9 C) Oral (!) 101 16 -- --  10/04/23 1115 -- -- -- -- 20 -- --  10/04/23 1047 (!) 136/58 -- -- 90 16 -- --  10/04/23 0954 120/64 -- -- 94 20 -- --  10/04/23 0902 130/72 -- -- 96 17 -- --  10/04/23 0742 138/68 99 F (37.2 C) Oral (!) 105 16 -- --  10/04/23 0726 -- -- -- -- -- 5\' 9"  (1.753 m) 107.6 kg   CBG (last 3)  Recent Labs    10/04/23 1302 10/04/23 1551 10/04/23 2138  GLUCAP 89 92 85     CNM to patient bedside to discuss AROM. Reviewed risks and benefits with patient and FOB. Patient desires to proceed with AROM. FHT Cat 1 prior to AROM. SVE 4/90/-3 Prior to AROM S. Marlys Singh CNM.  AROM successful on first attempt with the use of amnihook. Blood tinged fluid followed by copious amounts of clear fluid noted. Patient and  fetus tolerated well and FHT remained Cat 1 following AROM.    Assessment / Plan: Induction of labor due to [facility] medical conditions, gestational diabetes, and LGA status,  progressing well s/p cytotec , but slowly without intervention.   Labor: AROM in hopes to help labor progress. If contractions space out plan to start pit 2x2.  Preeclampsia:  CHTN on Procardia . BPs stable at this time. Continue to monitor   CBG: Normal, continue to monitor q4 Fetal Wellbeing:  Category I and Category II Pain Control:  Epidural I/D:  GBS positive < PCN   Anticipated MOD:  NSVD  Corie Diamond, CNM 10/04/2023, 9:50 PM

## 2023-10-04 NOTE — Anesthesia Preprocedure Evaluation (Addendum)
 Anesthesia Evaluation  Patient identified by MRN, date of birth, ID band Patient awake    Reviewed: Allergy & Precautions, Patient's Chart, lab work & pertinent test results  Airway Mallampati: II  TM Distance: >3 FB Neck ROM: Full    Dental no notable dental hx. (+) Teeth Intact   Pulmonary neg pulmonary ROS   Pulmonary exam normal        Cardiovascular hypertension, Pt. on medications Normal cardiovascular exam Rhythm:Regular     Neuro/Psych negative neurological ROS  negative psych ROS   GI/Hepatic Neg liver ROS,GERD  ,,Cholestasis of pregnancy   Endo/Other  diabetes, Well Controlled, Gestational  Obesity  Renal/GU negative Renal ROS  negative genitourinary   Musculoskeletal negative musculoskeletal ROS (+)    Abdominal  (+) + obese  Peds  Hematology  (+) Blood dyscrasia, anemia Hx/o alpha thalassemia trait   Anesthesia Other Findings   Reproductive/Obstetrics (+) Pregnancy                             Anesthesia Physical Anesthesia Plan  ASA: 2  Anesthesia Plan: Epidural   Post-op Pain Management:    Induction:   PONV Risk Score and Plan:   Airway Management Planned: Natural Airway  Additional Equipment: Fetal Monitoring and None  Intra-op Plan:   Post-operative Plan:   Informed Consent: I have reviewed the patients History and Physical, chart, labs and discussed the procedure including the risks, benefits and alternatives for the proposed anesthesia with the patient or authorized representative who has indicated his/her understanding and acceptance.       Plan Discussed with: Anesthesiologist  Anesthesia Plan Comments:         Anesthesia Quick Evaluation

## 2023-10-05 ENCOUNTER — Encounter (HOSPITAL_COMMUNITY): Payer: Self-pay | Admitting: Obstetrics and Gynecology

## 2023-10-05 ENCOUNTER — Encounter: Admitting: Family Medicine

## 2023-10-05 ENCOUNTER — Ambulatory Visit: Payer: Self-pay | Admitting: Family Medicine

## 2023-10-05 DIAGNOSIS — O1002 Pre-existing essential hypertension complicating childbirth: Secondary | ICD-10-CM | POA: Diagnosis not present

## 2023-10-05 DIAGNOSIS — O99214 Obesity complicating childbirth: Secondary | ICD-10-CM

## 2023-10-05 DIAGNOSIS — O0990 Supervision of high risk pregnancy, unspecified, unspecified trimester: Secondary | ICD-10-CM

## 2023-10-05 DIAGNOSIS — Z3A37 37 weeks gestation of pregnancy: Secondary | ICD-10-CM

## 2023-10-05 DIAGNOSIS — O3663X0 Maternal care for excessive fetal growth, third trimester, not applicable or unspecified: Secondary | ICD-10-CM | POA: Diagnosis not present

## 2023-10-05 DIAGNOSIS — O24420 Gestational diabetes mellitus in childbirth, diet controlled: Secondary | ICD-10-CM | POA: Diagnosis not present

## 2023-10-05 DIAGNOSIS — O99820 Streptococcus B carrier state complicating pregnancy: Secondary | ICD-10-CM | POA: Diagnosis not present

## 2023-10-05 LAB — TYPE AND SCREEN
ABO/RH(D): O NEG
Antibody Screen: POSITIVE

## 2023-10-05 LAB — CBC
HCT: 33.4 % — ABNORMAL LOW (ref 36.0–46.0)
HCT: 33.1 % — ABNORMAL LOW (ref 36.0–46.0)
Hemoglobin: 10.3 g/dL — ABNORMAL LOW (ref 12.0–15.0)
Hemoglobin: 10.2 g/dL — ABNORMAL LOW (ref 12.0–15.0)
MCH: 24.4 pg — ABNORMAL LOW (ref 26.0–34.0)
MCH: 23.9 pg — ABNORMAL LOW (ref 26.0–34.0)
MCHC: 30.8 g/dL (ref 30.0–36.0)
MCHC: 30.8 g/dL (ref 30.0–36.0)
MCV: 79.1 fL — ABNORMAL LOW (ref 80.0–100.0)
MCV: 77.5 fL — ABNORMAL LOW (ref 80.0–100.0)
Platelets: 312 10*3/uL (ref 150–400)
Platelets: 354 10*3/uL (ref 150–400)
RBC: 4.22 MIL/uL (ref 3.87–5.11)
RBC: 4.27 MIL/uL (ref 3.87–5.11)
RDW: 17.1 % — ABNORMAL HIGH (ref 11.5–15.5)
RDW: 17.2 % — ABNORMAL HIGH (ref 11.5–15.5)
WBC: 12 10*3/uL — ABNORMAL HIGH (ref 4.0–10.5)
WBC: 12.5 10*3/uL — ABNORMAL HIGH (ref 4.0–10.5)
nRBC: 0 % (ref 0.0–0.2)
nRBC: 0 % (ref 0.0–0.2)

## 2023-10-05 MED ORDER — ACETAMINOPHEN 325 MG PO TABS
650.0000 mg | ORAL_TABLET | ORAL | Status: DC | PRN
  Administered 2023-10-05: 650 mg via ORAL
  Filled 2023-10-05: qty 2

## 2023-10-05 MED ORDER — OXYCODONE HCL 5 MG PO TABS: 10.0000 mg | ORAL_TABLET | ORAL | Status: DC | PRN

## 2023-10-05 MED ORDER — SENNOSIDES-DOCUSATE SODIUM 8.6-50 MG PO TABS
2.0000 | ORAL_TABLET | Freq: Every day | ORAL | Status: DC
  Administered 2023-10-06 – 2023-10-07 (×2): 2 via ORAL
  Filled 2023-10-05 (×2): qty 2

## 2023-10-05 MED ORDER — BENZOCAINE-MENTHOL 20-0.5 % EX AERO
1.0000 | INHALATION_SPRAY | CUTANEOUS | Status: DC | PRN
  Administered 2023-10-06: 1 via TOPICAL
  Filled 2023-10-05: qty 56

## 2023-10-05 MED ORDER — COCONUT OIL OIL: 1.0000 | TOPICAL_OIL | Status: DC | PRN

## 2023-10-05 MED ORDER — ONDANSETRON HCL 4 MG/2ML IJ SOLN: 4.0000 mg | INTRAMUSCULAR | Status: DC | PRN

## 2023-10-05 MED ORDER — DIBUCAINE (PERIANAL) 1 % EX OINT: 1.0000 | TOPICAL_OINTMENT | CUTANEOUS | Status: DC | PRN

## 2023-10-05 MED ORDER — ONDANSETRON HCL 4 MG PO TABS: 4.0000 mg | ORAL_TABLET | ORAL | Status: DC | PRN

## 2023-10-05 MED ORDER — RHO D IMMUNE GLOBULIN 1500 UNIT/2ML IJ SOSY
300.0000 ug | PREFILLED_SYRINGE | Freq: Once | INTRAMUSCULAR | Status: AC
  Administered 2023-10-05: 300 ug via INTRAVENOUS
  Filled 2023-10-05: qty 2

## 2023-10-05 MED ORDER — OXYTOCIN-SODIUM CHLORIDE 30-0.9 UT/500ML-% IV SOLN
1.0000 m[IU]/min | INTRAVENOUS | Status: DC
  Administered 2023-10-05: 2 m[IU]/min via INTRAVENOUS
  Filled 2023-10-05: qty 500

## 2023-10-05 MED ORDER — IBUPROFEN 600 MG PO TABS
600.0000 mg | ORAL_TABLET | Freq: Four times a day (QID) | ORAL | Status: DC
  Administered 2023-10-05 – 2023-10-07 (×9): 600 mg via ORAL
  Filled 2023-10-05 (×9): qty 1

## 2023-10-05 MED ORDER — ZOLPIDEM TARTRATE 5 MG PO TABS: 5.0000 mg | ORAL_TABLET | Freq: Every evening | ORAL | Status: DC | PRN

## 2023-10-05 MED ORDER — DIPHENHYDRAMINE HCL 25 MG PO CAPS: 25.0000 mg | ORAL_CAPSULE | Freq: Four times a day (QID) | ORAL | Status: DC | PRN

## 2023-10-05 MED ORDER — OXYCODONE HCL 5 MG PO TABS: 5.0000 mg | ORAL_TABLET | ORAL | Status: DC | PRN

## 2023-10-05 MED ORDER — SIMETHICONE 80 MG PO CHEW: 80.0000 mg | CHEWABLE_TABLET | ORAL | Status: DC | PRN

## 2023-10-05 MED ORDER — PRENATAL MULTIVITAMIN CH
1.0000 | ORAL_TABLET | Freq: Every day | ORAL | Status: DC
  Administered 2023-10-05 – 2023-10-06 (×2): 1 via ORAL
  Filled 2023-10-05 (×2): qty 1

## 2023-10-05 MED ORDER — WITCH HAZEL-GLYCERIN EX PADS: 1.0000 | MEDICATED_PAD | CUTANEOUS | Status: DC | PRN

## 2023-10-05 MED ORDER — TETANUS-DIPHTH-ACELL PERTUSSIS 5-2.5-18.5 LF-MCG/0.5 IM SUSY: 0.5000 mL | PREFILLED_SYRINGE | Freq: Once | INTRAMUSCULAR | Status: DC

## 2023-10-05 MED ORDER — TERBUTALINE SULFATE 1 MG/ML IJ SOLN: 0.2500 mg | Freq: Once | INTRAMUSCULAR | Status: DC | PRN

## 2023-10-05 NOTE — Lactation Note (Signed)
 This note was copied from a baby's chart. Lactation Consultation Note  Patient Name: Amanda Ellison [id number] Date: 10/05/2023 Age:35 hours Reason for consult: Initial assessment;Early term 37-38.6wks;Maternal endocrine disorder, GDM  P3, Baby [redacted]w[redacted]d.  Mother is breastfeeding and formula feeding. Reviewed hand expression.  Encouraged offering the breast before formula. Feed on demand with cues.  Goal 8-12+ times per day after first 24 hrs.  Place baby STS if not cueing.  Provided volume guidelines and manual pump with 21 mm flanges. Suggest calling for assistance with latching and wake baby for feedings at q 3 hours.  Maternal Data Has patient been taught Hand Expression?: Yes Does the patient have breastfeeding experience prior to this delivery?: Yes How long did the patient breastfeed?: one year  Feeding Mother's Current Feeding Choice: Breast Milk and Formula  Lactation Tools Discussed/Used Tools: Pump;Flanges Flange Size: 21 Breast pump type: Manual Pump Education: Setup, frequency, and cleaning;Milk Storage Reason for Pumping: stimulation Pumping frequency: PRN  Interventions Interventions: Breast feeding basics reviewed;Hand express;Hand pump;Education;LC Services brochure;CDC milk storage guidelines  Discharge Pump: Manual  Consult Status Consult Status: Follow-up Date: 10/06/23 Follow-up type: In-patient   Amanda Graft Boschen  RN, IBCLC 10/05/2023, 10:00 AM

## 2023-10-05 NOTE — Anesthesia Postprocedure Evaluation (Signed)
 Anesthesia Post Note  Patient: Amanda Ellison  Procedure(s) Performed: AN AD HOC LABOR EPIDURAL     Patient location during evaluation: Mother Baby Anesthesia Type: Epidural Level of consciousness: awake, oriented and awake and alert Pain management: pain level controlled Vital Signs Assessment: post-procedure vital signs reviewed and stable Respiratory status: spontaneous breathing, nonlabored ventilation and respiratory function stable Cardiovascular status: stable Postop Assessment: no headache, patient able to bend at knees, adequate PO intake, able to ambulate and no apparent nausea or vomiting Anesthetic complications: no   No notable events documented.  Last Vitals:  Vitals:   10/05/23 0410 10/05/23 0515  BP: 138/88 (!) 141/79  Pulse: 93 84  Resp: 17 18  Temp: 36.8 C 36.8 C  SpO2: 100% 100%    Last Pain:  Vitals:   10/05/23 0515  TempSrc: Oral  PainSc: 0-No pain   Pain Goal:                   Seana Underwood

## 2023-10-05 NOTE — Discharge Summary (Signed)
 Postpartum Discharge Summary      Patient Name: Amanda Ellison DOB: Nov 06, 1990 MRN: [id number]  Date of admission: 10/04/2023 Delivery date:10/05/2023 Delivering provider: PAYNE, SHANTONETTE M Date of discharge: 10/07/2023  Admitting diagnosis: Uncontrolled diabetes mellitus with hyperglycemia (HCC) [E11.65] Intrauterine pregnancy: [redacted]w[redacted]d     Secondary diagnosis:  Principal Problem:   Uncontrolled diabetes mellitus with hyperglycemia (HCC)  Additional problems:  Patient Active Problem List   Diagnosis Date Noted   Uncontrolled diabetes mellitus with hyperglycemia (HCC) 10/04/2023   LGA (large for gestational age) fetus affecting management of mother 08/12/2023   Obesity affecting pregnancy, antepartum 05/25/2023   Alpha thalassemia silent carrier 05/25/2023   Supervision of high risk pregnancy, antepartum 03/23/2023   Chronic hypertension affecting pregnancy 03/13/2023   Class 1 obesity due to excess calories with serious comorbidity in adult 02/14/2020   History of cholestasis during pregnancy 10/26/2019   Group B Streptococcus carrier, +RV culture, currently pregnant 01/31/2015   Gestational diabetes mellitus 12/26/2014   Rh negative, antepartum 10/22/2014       Discharge diagnosis: Term Pregnancy Delivered                                              Post partum procedures:rhogam Augmentation: AROM and Pitocin  Complications: None  Hospital course: Induction of Labor With Vaginal Delivery   33 y.o. yo [id number] at [redacted]w[redacted]d was admitted to the hospital 10/04/2023 for induction of labor.  Indication for induction: A1 DM.  Patient had an labor course complicated by unstable lie.  Membrane Rupture Time/Date: 9:31 PM,10/04/2023  Delivery Method:Vaginal, Spontaneous Operative Delivery:N/A Episiotomy: None Lacerations:  2nd degree Details of delivery can be found in separate delivery note.  Patient had a postpartum course complicated by none. Patient is discharged home  10/07/23.  Newborn Data: Birth date:10/05/2023 Birth time:1:44 AM Gender:Female Living status:Living Apgars:8 ,9  Weight:3940 g  Magnesium  Sulfate received: No BMZ received: No Rhophylac :Yes MMR:N/A T-DaP:Given prenatally Flu: N/A RSV Vaccine received: No Transfusion:No  Immunizations received: Immunization History  Administered Date(s) Administered   Influenza,inj,Quad PF,6+ Mos 12/25/2014, 02/14/2020   Tdap 12/04/2014, 08/12/2023    Physical exam  Vitals:   10/06/23 1515 10/06/23 2017 10/07/23 0507 10/07/23 1051  BP: 109/73 133/66 109/70 135/89  Pulse: 83 84    Resp: 18 19 19    Temp: 98.2 F (36.8 C) 99.2 F (37.3 C) 99.2 F (37.3 C)   TempSrc: Oral Oral Oral   SpO2: 100% 100% 100%   Weight:      Height:       General: alert, cooperative, and no distress Lochia: appropriate Uterine Fundus: firm Incision: Healing well with no significant drainage DVT Evaluation: No evidence of DVT seen on physical exam. Labs: Lab Results  Component Value Date   WBC 12.5 (H) 10/05/2023   HGB 10.2 (L) 10/05/2023   HCT 33.1 (L) 10/05/2023   MCV 77.5 (L) 10/05/2023   PLT 354 10/05/2023      Latest Ref Rng & Units 10/04/2023    8:12 AM  CMP  Glucose 70 - 99 mg/dL 409   BUN 6 - 20 mg/dL 7   Creatinine 8.11 - 9.14 mg/dL 7.82   Sodium 956 - 213 mmol/L 132   Potassium 3.5 - 5.1 mmol/L 4.5   Chloride 98 - 111 mmol/L 103   CO2 22 - 32 mmol/L 21   Calcium  8.9 -  10.3 mg/dL 9.1   Total Protein 6.5 - 8.1 g/dL 6.1   Total Bilirubin 0.0 - 1.2 mg/dL 0.9   Alkaline Phos 38 - 126 U/L 93   AST 15 - 41 U/L 37   ALT 0 - 44 U/L 25    Edinburgh Score:    10/06/2023   11:24 AM  Edinburgh Postnatal Depression Scale Screening Tool  I have been able to laugh and see the funny side of things. 0  I have looked forward with enjoyment to things. 0  I have blamed myself unnecessarily when things went wrong. 0  I have been anxious or worried for no good reason. 0  I have felt scared or  panicky for no good reason. 0  Things have been getting on top of me. 0  I have been so unhappy that I have had difficulty sleeping. 0  I have felt sad or miserable. 0  I have been so unhappy that I have been crying. 0  The thought of harming myself has occurred to me. 0  Edinburgh Postnatal Depression Scale Total 0   Edinburgh Postnatal Depression Scale Total: 0   After visit meds:  Allergies as of 10/07/2023   No Known Allergies      Medication List     STOP taking these medications    Accu-Chek Guide w/Device Kit   Accu-Chek Softclix Lancets lancets   aspirin  EC 81 MG tablet   Blood Glucose Test Strips 333 Strp       TAKE these medications    acetaminophen  325 MG tablet Commonly known as: Tylenol  Take 2 tablets (650 mg total) by mouth every 4 (four) hours as needed (for pain scale < 4). What changed:  medication strength how much to take when to take this reasons to take this   docusate sodium  100 MG capsule Commonly known as: Colace Take 1 capsule (100 mg total) by mouth daily as needed for mild constipation.   furosemide  20 MG tablet Commonly known as: LASIX  Take 1 tablet (20 mg total) by mouth daily. Start taking on: October 08, 2023   ibuprofen  600 MG tablet Commonly known as: ADVIL  Take 1 tablet (600 mg total) by mouth every 6 (six) hours.   NIFEdipine  30 MG 24 hr tablet Commonly known as: ADALAT  CC Take 1 tablet (30 mg total) by mouth 2 (two) times daily. What changed: Another medication with the same name was removed. Continue taking this medication, and follow the directions you see here.   PNV Prenatal Plus Multivit+DHA 27-1 & 312 MG Misc Take 1 tablet by mouth daily.   potassium chloride  SA 20 MEQ tablet Commonly known as: KLOR-CON  M Take 1 tablet (20 mEq total) by mouth daily. Start taking on: October 08, 2023         Discharge home in stable condition Infant Feeding: Bottle and Breast Infant Disposition:home with mother Discharge  instruction: per After Visit Summary and Postpartum booklet. Activity: Advance as tolerated. Pelvic rest for 6 weeks.  Diet: routine diet Future Appointments: Future Appointments  Date Time Provider Department Center  10/19/2023  2:00 PM [facility] NURSE [facility]  11/16/2023  8:50 AM [facility]  11/16/2023 10:35 AM Lacey Pian, MD [facility]   Follow up Visit:   Please schedule this patient for a in person postpartum visit in 4 weeks with the following provider: Any provider. Additional Postpartum F/U:2 hour GTT and BP check 1 week  High risk pregnancy complicated by: GDM and HTN  Delivery mode:  Vaginal, Spontaneous Anticipated Birth Control:  POPs   Message sent to [facility] on 10/05/23 by S. Marlys Singh CNM   10/07/2023 Salomon Cree, CNM

## 2023-10-06 ENCOUNTER — Other Ambulatory Visit: Payer: Self-pay

## 2023-10-06 LAB — RH IG WORKUP (INCLUDES ABO/RH)
Fetal Screen: NEGATIVE
Gestational Age(Wks): 37
Unit division: 0

## 2023-10-06 MED ORDER — FUROSEMIDE 20 MG PO TABS
20.0000 mg | ORAL_TABLET | Freq: Every day | ORAL | Status: DC
  Administered 2023-10-06 – 2023-10-07 (×2): 20 mg via ORAL
  Filled 2023-10-06 (×2): qty 1

## 2023-10-06 MED ORDER — POTASSIUM CHLORIDE CRYS ER 20 MEQ PO TBCR
20.0000 meq | EXTENDED_RELEASE_TABLET | Freq: Every day | ORAL | Status: DC
  Administered 2023-10-06 – 2023-10-07 (×2): 20 meq via ORAL
  Filled 2023-10-06 (×2): qty 1

## 2023-10-06 NOTE — Progress Notes (Signed)
 Post Partum Day 1 Subjective: no complaints, up ad lib, voiding, and tolerating PO  Objective: Blood pressure 128/77, pulse 84, temperature 98.2 F (36.8 C), temperature source Oral, resp. rate 17, height 5\' 9"  (1.753 m), weight 107.6 kg, last menstrual period 01/02/2023, SpO2 100%, unknown if currently breastfeeding.  Physical Exam:  General: alert, cooperative, and appears stated age Lochia: appropriate Uterine Fundus: firm Incision: N/A DVT Evaluation: No evidence of DVT seen on physical exam. No cords or calf tenderness. No significant calf/ankle edema.  Patient Vitals for the past 24 hrs:  BP Temp Temp src Pulse Resp SpO2  10/06/23 0459 128/77 -- -- 84 17 100 %  10/05/23 2056 132/76 98.2 F (36.8 C) Oral 90 16 100 %  10/05/23 1720 129/71 98.4 F (36.9 C) Oral 87 16 99 %  10/05/23 1315 126/78 98.7 F (37.1 C) Axillary 90 15 99 %  10/05/23 0915 (!) 118/54 98.2 F (36.8 C) Oral 100 18 99 %     Recent Labs    10/05/23 0240 10/05/23 0753  HGB 10.3* 10.2*  HCT 33.4* 33.1*  CBG (last 3)  Recent Labs    10/04/23 1551 10/04/23 2138 10/04/23 2356  GLUCAP 92 85 108*     Assessment/Plan: Plan for discharge tomorrow - Patient placed on Procardia  and Lasix for cHTN. Since starting BPs have been normal since. Continue to monitor.    LOS: 2 days   Corie Diamond, CNM 10/06/2023, 7:33 AM

## 2023-10-07 ENCOUNTER — Other Ambulatory Visit (HOSPITAL_COMMUNITY): Payer: Self-pay

## 2023-10-07 MED ORDER — FUROSEMIDE 20 MG PO TABS
20.0000 mg | ORAL_TABLET | Freq: Every day | ORAL | 0 refills | Status: AC
  Filled 2023-10-07: qty 5, 5d supply, fill #0

## 2023-10-07 MED ORDER — POTASSIUM CHLORIDE CRYS ER 20 MEQ PO TBCR
20.0000 meq | EXTENDED_RELEASE_TABLET | Freq: Every day | ORAL | 0 refills | Status: AC
  Filled 2023-10-07: qty 5, 5d supply, fill #0

## 2023-10-07 MED ORDER — ACETAMINOPHEN 325 MG PO TABS
650.0000 mg | ORAL_TABLET | ORAL | 0 refills | Status: AC | PRN
  Filled 2023-10-07: qty 30, 3d supply, fill #0

## 2023-10-07 MED ORDER — IBUPROFEN 600 MG PO TABS
600.0000 mg | ORAL_TABLET | Freq: Four times a day (QID) | ORAL | 0 refills | Status: AC
  Filled 2023-10-07: qty 30, 8d supply, fill #0

## 2023-10-07 NOTE — Progress Notes (Addendum)
 Babyscripts enrolle set up on computer in the patient's room. Pt did not receive email yet. Pt was able to set up babyscripts app on her phone. RN provided pt with a bp monitor and wrote the parameters (140/90) of when to call her providers. RN requested this pt check her bp in the AM and PM and whenever she feels it is up.

## 2023-10-07 NOTE — Lactation Note (Signed)
 This note was copied from a baby's chart. Lactation Consultation Note  Patient Name: Amanda Ellison Date: 10/07/2023 Age:35 hours  Reason for consult: Follow-up assessment;Early term 37-38.6wks;Maternal endocrine disorder  P3, [redacted]w[redacted]d, 2.5% weight loss (gain of 95 grams)  Mother reports baby Amanda is breastfeeding well. Baby is also being fed 40 ml of formula. Mother reports her breast are heavier and she feels her milk is coming in.   Encouraged to breastfeed before formula feeding. Discussed management of engorgement, signs and symptom of mastitis, and if mastitis occurs to report to patient's OB provider. Discussed Lactogenesis II/ supply and demand for maintaining milk supply.        Feeding Mother's Current Feeding Choice: Breast Milk and Formula  LATCH Score  Not observed    Interventions Interventions: Breast feeding basics reviewed;Education  Discharge Discharge Education: Engorgement and breast care;Warning signs for feeding baby Pump: Manual  Consult Status Consult Status: Complete Date: 10/07/23    Esperanza Hedges 10/07/2023, 1:53 PM

## 2023-10-07 NOTE — Patient Instructions (Signed)
 If interested in an outpatient lactation consult in office or virtually please reach out to us  at [facility] (First Floor) [address] [place] Please call [phone] and press 4 for lactation.    Alyce Jubilee, IBCLC and Orlie Bjornstad BSW [facility] for [facility]

## 2023-10-07 NOTE — Lactation Note (Signed)
 This note was copied from a baby's chart. Lactation Consultation Note  Patient Name: Amanda Ellison Date: 10/07/2023 Age:35 hours Reason for consult: Follow-up assessment;Early term 37-38.6wks (weight loss -4.95%. See MOB: MR- hx GDM and CHTN) Per MOB, infant briefly latch 3 times earlier today for 5 minutes, MOB mostly been formula feeding infant 30 to 40 mls per feeding. Per MOB, she wants to breastfeed. MOB latched infant on her right breast using the foot ball hold position, infant latched with depth and was still breastfeeding after 8 minutes when LC left the room. MOB will continue to breastfeed infant every 3 hours, and limit breast and bottle feeding to 30 minutes or less. MOB will latch infant first every feeding to help stimulate and establish her milk supply and afterwards she will continue to supplement infant with formula, this is her feeding choice. MOB knows to call for further latch assistance if needed.   Maternal Data Does the patient have breastfeeding experience prior to this delivery?: Yes How long did the patient breastfeed?: Per MOB, she breast and formula feed 1st and 2nd child for one year each.  Feeding Mother's Current Feeding Choice: Breast Milk and Formula  LATCH Score Latch: Grasps breast easily, tongue down, lips flanged, rhythmical sucking.  Audible Swallowing: A few with stimulation  Type of Nipple: Everted at rest and after stimulation  Comfort (Breast/Nipple): Soft / non-tender  Hold (Positioning): Assistance needed to correctly position infant at breast and maintain latch.  LATCH Score: 8   Lactation Tools Discussed/Used    Interventions Interventions: Skin to skin;Assisted with latch;Breast compression;Adjust position;Support pillows;Position options;Expressed milk;Education  Discharge    Consult Status Consult Status: Follow-up Date: 10/07/23 Follow-up type: In-patient    Pecolia Bourbon 10/07/2023, 12:06 AM

## 2023-10-08 ENCOUNTER — Other Ambulatory Visit (HOSPITAL_COMMUNITY): Payer: Self-pay

## 2023-10-13 ENCOUNTER — Inpatient Hospital Stay (HOSPITAL_COMMUNITY)

## 2023-10-13 ENCOUNTER — Inpatient Hospital Stay (HOSPITAL_COMMUNITY)
Admission: AD | Admit: 2023-10-13 | Discharge: 2023-10-13 | Discharge status: Against Medical Advice | Attending: Obstetrics and Gynecology | Admitting: Obstetrics and Gynecology

## 2023-10-13 ENCOUNTER — Encounter (HOSPITAL_COMMUNITY): Payer: Self-pay | Admitting: Obstetrics and Gynecology

## 2023-10-13 ENCOUNTER — Other Ambulatory Visit: Payer: Self-pay

## 2023-10-13 DIAGNOSIS — O1003 Pre-existing essential hypertension complicating the puerperium: Secondary | ICD-10-CM | POA: Insufficient documentation

## 2023-10-13 DIAGNOSIS — O864 Pyrexia of unknown origin following delivery: Secondary | ICD-10-CM | POA: Insufficient documentation

## 2023-10-13 DIAGNOSIS — R1030 Lower abdominal pain, unspecified: Secondary | ICD-10-CM | POA: Diagnosis present

## 2023-10-13 DIAGNOSIS — O24439 Gestational diabetes mellitus in the puerperium, unspecified control: Secondary | ICD-10-CM | POA: Insufficient documentation

## 2023-10-13 DIAGNOSIS — D72829 Elevated white blood cell count, unspecified: Secondary | ICD-10-CM | POA: Insufficient documentation

## 2023-10-13 DIAGNOSIS — O8612 Endometritis following delivery: Secondary | ICD-10-CM

## 2023-10-13 DIAGNOSIS — M545 Low back pain, unspecified: Secondary | ICD-10-CM | POA: Diagnosis present

## 2023-10-13 LAB — URINALYSIS, ROUTINE W REFLEX MICROSCOPIC
Bilirubin Urine: NEGATIVE
Glucose, UA: NEGATIVE mg/dL
Ketones, ur: NEGATIVE mg/dL
Leukocytes,Ua: NEGATIVE
Nitrite: NEGATIVE
Protein, ur: NEGATIVE mg/dL
Specific Gravity, Urine: 1.008 (ref 1.005–1.030)
pH: 7 (ref 5.0–8.0)

## 2023-10-13 LAB — AMYLASE: Amylase: 40 U/L (ref 28–100)

## 2023-10-13 LAB — RESP PANEL BY RT-PCR (RSV, FLU A&B, COVID)  RVPGX2
Influenza A by PCR: NEGATIVE
Influenza B by PCR: NEGATIVE
Resp Syncytial Virus by PCR: NEGATIVE
SARS Coronavirus 2 by RT PCR: NEGATIVE

## 2023-10-13 LAB — COMPREHENSIVE METABOLIC PANEL WITH GFR
ALT: 26 U/L (ref 0–44)
AST: 28 U/L (ref 15–41)
Albumin: 2.9 g/dL — ABNORMAL LOW (ref 3.5–5.0)
Alkaline Phosphatase: 96 U/L (ref 38–126)
Anion gap: 12 (ref 5–15)
BUN: 8 mg/dL (ref 6–20)
CO2: 21 mmol/L — ABNORMAL LOW (ref 22–32)
Calcium: 9.1 mg/dL (ref 8.9–10.3)
Chloride: 103 mmol/L (ref 98–111)
Creatinine, Ser: 0.65 mg/dL (ref 0.44–1.00)
GFR, Estimated: >60 mL/min (ref >60)
Glucose, Bld: 119 mg/dL — ABNORMAL HIGH (ref 70–99)
Potassium: 3.5 mmol/L (ref 3.5–5.1)
Sodium: 136 mmol/L (ref 135–145)
Total Bilirubin: 0.5 mg/dL (ref 0.0–1.2)
Total Protein: 6.6 g/dL (ref 6.5–8.1)

## 2023-10-13 LAB — CBC
HCT: 34.8 % — ABNORMAL LOW (ref 36.0–46.0)
Hemoglobin: 10.8 g/dL — ABNORMAL LOW (ref 12.0–15.0)
MCH: 24.1 pg — ABNORMAL LOW (ref 26.0–34.0)
MCHC: 31 g/dL (ref 30.0–36.0)
MCV: 77.5 fL — ABNORMAL LOW (ref 80.0–100.0)
Platelets: 351 10*3/uL (ref 150–400)
RBC: 4.49 MIL/uL (ref 3.87–5.11)
RDW: 16.7 % — ABNORMAL HIGH (ref 11.5–15.5)
WBC: 17.6 10*3/uL — ABNORMAL HIGH (ref 4.0–10.5)
nRBC: 0 % (ref 0.0–0.2)

## 2023-10-13 LAB — LIPASE, BLOOD: Lipase: 28 U/L (ref 11–51)

## 2023-10-13 MED ORDER — AMOXICILLIN-POT CLAVULANATE 875-125 MG PO TABS: 1.0000 | ORAL_TABLET | Freq: Two times a day (BID) | ORAL | 0 refills | Status: AC

## 2023-10-13 MED ORDER — PIPERACILLIN-TAZOBACTAM 3.375 G IVPB
3.3750 g | Freq: Three times a day (TID) | INTRAVENOUS | Status: DC
  Filled 2023-10-13 (×2): qty 50

## 2023-10-13 MED ORDER — IOHEXOL 350 MG/ML SOLN
75.0000 mL | Freq: Once | INTRAVENOUS | Status: AC | PRN
  Administered 2023-10-13: 75 mL via INTRAVENOUS

## 2023-10-13 MED ORDER — PIPERACILLIN-TAZOBACTAM 3.375 G IVPB 30 MIN
3.3750 g | Freq: Once | INTRAVENOUS | Status: AC
  Administered 2023-10-13: 3.375 g via INTRAVENOUS
  Filled 2023-10-13 (×2): qty 50

## 2023-10-13 NOTE — MAU Note (Signed)
 Amanda Ellison is a 33 y.o. at Unknown here in MAU reporting: she;s having lower abdominal and back pain that began at 0200 this morning.  Reports the pain is intermittent and aching in both the abdomen and back.  Reports last took meds for pain at 1100 yesterday morning. S/P NVD 10/05/2023  LMP: NA Onset of complaint: 0200 Pain score: 9 Vitals:   10/13/23 0759  BP: 125/66  Pulse: (!) 118  Resp: 18  Temp: (!) 102.8 F (39.3 C)  SpO2: 100%     FHT: NA  Lab orders placed from triage: UA   Offered interpreter, declines need

## 2023-10-13 NOTE — MAU Note (Addendum)
 At [facility] with Maryclare Smoke PA after pt requests leaving AMA, pt stating she will come back later tonight or tomorrow for care. PA discussed in length risks of leaving AMA to herself and verified understanding discussion in [place]. Pt verbalizes understanding.   1725 pt left AMA - paper signed

## 2023-10-13 NOTE — MAU Note (Signed)
 Pt stating she needs to leave or have baby come to BF. Pt informed husband can bring the baby but not other 2 children. Pt informed husband will have to stay with the baby the whole time baby in the room.

## 2023-10-13 NOTE — H&P (Addendum)
 Chief Complaint:  Back Pain and Abdominal Pain   HPI   None     Amanda Ellison is a 33 y.o. G3P3003 at PPD8 who presents to maternity admissions reporting lower abdominal pain and low back pain.  Pain started around 0200 this morning, described as intermittent aching pain.  Currently 9/10 in intensity.  Spontaneous vaginal delivery on 10/05/2023 with no complications, second-degree laceration repaired, no postpartum complications prior to discharge home on 10/07/2023. History difficult to ascertain at times, unclear which medications patient is currently taking at home. Interpreter offered multiple times, declines interpreter.  Pregnancy Course: Receives care at [facility]. Prenatal records reviewed.  Pregnancy complicated by uncontrolled gestational diabetes, LGA fetus, obesity, chronic hypertension.  Past Medical History:  Diagnosis Date   Anemia 04/24/2016   Cholelithiasis affecting pregnancy, antepartum    Gestational diabetes    Hypertension    Prediabetes 02/14/2020   Last A1C 6.0 (05/27/2018)     OB History  Gravida Para Term Preterm AB Living  3 3 3  0 0 3  SAB IAB Ectopic Multiple Live Births  0 0 0 0 3    # Outcome Date GA Lbr Len/2nd Weight Sex Type Anes PTL Lv  3 Term 10/05/23 [redacted]w[redacted]d 03:41 / 00:32 3940 g F Vag-Spont EPI, Local  LIV  2 Term 10/26/19 [redacted]w[redacted]d / 00:33 3100 g F Vag-Spont EPI  LIV  1 Term 02/09/15 [redacted]w[redacted]d 02:21 / 00:51 3425 g F Vag-Spont EPI  LIV   Past Surgical History:  Procedure Laterality Date   NO PAST SURGERIES     Family History  Problem Relation Age of Onset   Other Mother        sick one week and died, unknown cause   Other Father        died from car accident   Alcohol abuse Neg Hx    Arthritis Neg Hx    Asthma Neg Hx    Diabetes Neg Hx    Drug abuse Neg Hx    Early death Neg Hx    Birth defects Neg Hx    Cancer Neg Hx    COPD Neg Hx    Depression Neg Hx    Hearing loss Neg Hx    Heart disease Neg Hx    Hyperlipidemia Neg Hx    Hypertension Neg  Hx    Kidney disease Neg Hx    Learning disabilities Neg Hx    Mental illness Neg Hx    Mental retardation Neg Hx    Miscarriages / Stillbirths Neg Hx    Stroke Neg Hx    Vision loss Neg Hx    Varicose Veins Neg Hx    Social History   Tobacco Use   Smoking status: Never   Smokeless tobacco: Never  Vaping Use   Vaping status: Never Used  Substance Use Topics   Alcohol use: No   Drug use: No   No Known Allergies Medications Prior to Admission  Medication Sig Dispense Refill Last Dose/Taking   acetaminophen  (TYLENOL ) 325 MG tablet Take 2 tablets (650 mg total) by mouth every 4 (four) hours as needed (for pain scale < 4). 30 tablet 0    docusate sodium  (COLACE) 100 MG capsule Take 1 capsule (100 mg total) by mouth daily as needed for mild constipation. (Patient not taking: Reported on 09/29/2023) 30 capsule 9    furosemide  (LASIX ) 20 MG tablet Take 1 tablet (20 mg total) by mouth daily. 5 tablet 0    ibuprofen  (ADVIL )  600 MG tablet Take 1 tablet (600 mg total) by mouth every 6 (six) hours. 30 tablet 0    NIFEdipine  (ADALAT  CC) 30 MG 24 hr tablet Take 1 tablet (30 mg total) by mouth 2 (two) times daily. 60 tablet 2    potassium chloride  SA (KLOR-CON  M) 20 MEQ tablet Take 1 tablet (20 mEq total) by mouth daily. 5 tablet 0    Prenatal Vit-Fe Fum-FA-Omega (PNV PRENATAL PLUS MULTIVIT+DHA) 27-1 & 312 MG MISC Take 1 tablet by mouth daily. 30 each 12     I have reviewed patient's Past Medical Hx, Surgical Hx, Family Hx, Social Hx, medications and allergies.   ROS  Pertinent items noted in HPI and remainder of comprehensive ROS otherwise negative.   PHYSICAL EXAM  Patient Vitals for the past 24 hrs:  BP Temp Temp src Pulse Resp SpO2 Weight  10/13/23 1428 136/74 100.1 F (37.8 C) Oral (!) 117 15 -- --  10/13/23 0858 128/72 -- -- (!) 111 18 -- --  10/13/23 0828 (!) 151/78 100.1 F (37.8 C) Oral (!) 115 16 -- --  10/13/23 0759 125/66 (!) 102.8 F (39.3 C) Oral (!) 118 18 100 % --   10/13/23 0743 -- -- -- -- -- -- 103.2 kg    Constitutional: Well-developed, well-nourished female in no acute distress.  HEENT: atraumatic, normocephalic. Neck has normal ROM. EOM intact. Cardiovascular: normal rhythm, tachycardia, warm and well-perfused Respiratory: normal effort, no problems with respiration noted Breast: no erythema, warmth, discoloration, nipple discharge. GI: Abd soft, non-distended. No tenderness to light or deep palpation all 4 quadrants. MSK: Extremities nontender, no edema, normal ROM Skin: warm and dry. Acyanotic, no jaundice or pallor. Neurologic: Alert and oriented x 4. No abnormal coordination. Psychiatric: Normal mood. Speech not slurred, not rapid/pressured. Patient is cooperative. GU: no CVA tenderness Pelvic exam: VULVA: normal appearing vulva with no masses, tenderness or lesions, VAGINA: vaginal tenderness at introitus near 2nd degree laceration repair, copious amounts of dark red blood in vaginal canal, vaginal odor, CERVIX: dark red blood in cervical os, no cervical motion tenderness, exam chaperoned by Ronda Cocks RN.      Labs: Results for orders placed or performed during the hospital encounter of 10/13/23 (from the past 24 hours)  Comprehensive metabolic panel     Status: Abnormal   Collection Time: 10/13/23  9:11 AM  Result Value Ref Range   Sodium 136 135 - 145 mmol/L   Potassium 3.5 3.5 - 5.1 mmol/L   Chloride 103 98 - 111 mmol/L   CO2 21 (L) 22 - 32 mmol/L   Glucose, Bld 119 (H) 70 - 99 mg/dL   BUN 8 6 - 20 mg/dL   Creatinine, Ser 1.06 0.44 - 1.00 mg/dL   Calcium  9.1 8.9 - 10.3 mg/dL   Total Protein 6.6 6.5 - 8.1 g/dL   Albumin 2.9 (L) 3.5 - 5.0 g/dL   AST 28 15 - 41 U/L   ALT 26 0 - 44 U/L   Alkaline Phosphatase 96 38 - 126 U/L   Total Bilirubin 0.5 0.0 - 1.2 mg/dL   GFR, Estimated >26 >94 mL/min   Anion gap 12 5 - 15  CBC     Status: Abnormal   Collection Time: 10/13/23  9:11 AM  Result Value Ref Range   WBC 17.6 (H) 4.0 -  10.5 K/uL   RBC 4.49 3.87 - 5.11 MIL/uL   Hemoglobin 10.8 (L) 12.0 - 15.0 g/dL   HCT 85.4 (L) 62.7 - 03.5 %  MCV 77.5 (L) 80.0 - 100.0 fL   MCH 24.1 (L) 26.0 - 34.0 pg   MCHC 31.0 30.0 - 36.0 g/dL   RDW 40.9 (H) 81.1 - 91.4 %   Platelets 351 150 - 400 K/uL   nRBC 0.0 0.0 - 0.2 %  Amylase     Status: None   Collection Time: 10/13/23  9:11 AM  Result Value Ref Range   Amylase 40 28 - 100 U/L  Lipase, blood     Status: None   Collection Time: 10/13/23  9:11 AM  Result Value Ref Range   Lipase 28 11 - 51 U/L  Resp panel by RT-PCR (RSV, Flu A&B, Covid) Anterior Nasal Swab     Status: None   Collection Time: 10/13/23  9:16 AM   Specimen: Anterior Nasal Swab  Result Value Ref Range   SARS Coronavirus 2 by RT PCR NEGATIVE NEGATIVE   Influenza A by PCR NEGATIVE NEGATIVE   Influenza B by PCR NEGATIVE NEGATIVE   Resp Syncytial Virus by PCR NEGATIVE NEGATIVE  Urinalysis, Routine w reflex microscopic -Urine, Clean Catch     Status: Abnormal   Collection Time: 10/13/23  9:51 AM  Result Value Ref Range   Color, Urine STRAW (A) YELLOW   APPearance CLEAR CLEAR   Specific Gravity, Urine 1.008 1.005 - 1.030   pH 7.0 5.0 - 8.0   Glucose, UA NEGATIVE NEGATIVE mg/dL   Hgb urine dipstick SMALL (A) NEGATIVE   Bilirubin Urine NEGATIVE NEGATIVE   Ketones, ur NEGATIVE NEGATIVE mg/dL   Protein, ur NEGATIVE NEGATIVE mg/dL   Nitrite NEGATIVE NEGATIVE   Leukocytes,Ua NEGATIVE NEGATIVE   RBC / HPF 0-5 0 - 5 RBC/hpf   WBC, UA 0-5 0 - 5 WBC/hpf   Bacteria, UA RARE (A) NONE SEEN   Squamous Epithelial / HPF 0-5 0 - 5 /HPF    Imaging:  US  PELVIS (TRANSABDOMINAL ONLY) Result Date: 10/13/2023 CLINICAL DATA:  Pelvic pain and fever, postpartum day 8 EXAM: TRANSABDOMINAL AND TRANSVAGINAL ULTRASOUND OF PELVIS DOPPLER ULTRASOUND OF OVARIES TECHNIQUE: Both transabdominal and transvaginal ultrasound examinations of the pelvis were performed. Transabdominal technique was performed for global imaging of the pelvis  including uterus, ovaries, adnexal regions, and pelvic cul-de-sac. It was necessary to proceed with endovaginal exam following the transabdominal exam to visualize the uterus and adnexa. Color and duplex Doppler ultrasound was utilized to evaluate blood flow to the ovaries. COMPARISON:  CT abdomen and pelvis October 13, 2023 FINDINGS: Uterus Measurements: 14.7 x 8.9 x 2.9 cm = volume: 743 mL. Enlarged postpartum uterus without evidence of obvious masses. Normal vascularity. Endometrium Thickness: 3.9 mm.  Small amount of endometrial fluid. Right ovary Measurements: 3.1 x 2.3 x 2.5 cm = volume: 9.3 mL. Normal appearance/no adnexal mass. Left ovary Measurements: 4.6 x 3.3 x 3.1 cm = volume: 24.4 mL. Normal appearance/no adnexal mass. Pulsed Doppler evaluation of both ovaries demonstrates normal low-resistance arterial and venous waveforms. Other findings No abnormal free fluid. IMPRESSION: *Enlarged postpartum uterus without evidence of obvious masses. *Small amount of endometrial fluid. *No evidence of ovarian torsion. Electronically Signed   By: Fredrich Jefferson M.D.   On: 10/13/2023 16:35   CT ABDOMEN PELVIS W CONTRAST Result Date: 10/13/2023 CLINICAL DATA:  Abdominal pain. History of vaginal delivery on 10/05/2023. EXAM: CT ABDOMEN AND PELVIS WITH CONTRAST TECHNIQUE: Multidetector CT imaging of the abdomen and pelvis was performed using the standard protocol following bolus administration of intravenous contrast. RADIATION DOSE REDUCTION: This exam was performed according  to the departmental dose-optimization program which includes automated exposure control, adjustment of the mA and/or kV according to patient size and/or use of iterative reconstruction technique. CONTRAST:  75mL OMNIPAQUE IOHEXOL 350 MG/ML SOLN COMPARISON:  None Available. FINDINGS: Lower chest: Evaluation is compromised by motion degradation. 3 mm pulmonary nodule at the left lung base is nonspecific and may be postinflammatory (5:34).  Hepatobiliary: No focal liver abnormality is seen. No gallstones, gallbladder wall thickening, or biliary dilatation. Pancreas: Unremarkable. No pancreatic ductal dilatation or surrounding inflammatory changes. Spleen: Normal in size without focal abnormality. Adrenals/Urinary Tract: Adrenal glands are unremarkable. Kidneys are normal, without renal calculi, focal lesion, or hydronephrosis. Bladder is unremarkable. Stomach/Bowel: Stomach is within normal limits. Appendix appears normal. Peripheral displacement of bowel loops in the pelvis secondary to the enlarged postpartum uterus. No evidence of bowel wall thickening, distention, or inflammatory changes. Vascular/Lymphatic: No significant vascular findings are present. No enlarged abdominal or pelvic lymph nodes. Reproductive: Enlarged postpartum uterus with heterogenous hyperdense material within the endometrial canal. Bilateral adnexa are within normal limits. Other: No abdominopelvic ascites. No intraperitoneal free air. Diastasis of the lower rectus musculature. Musculoskeletal: No acute or significant osseous findings. IMPRESSION: Enlarged postpartum uterus with heterogenous hyperdense material within the endometrial canal, concerning for retained products of conception or hematometra. Recommend further evaluation with pelvic ultrasound. Electronically Signed   By: Mannie Seek M.D.   On: 10/13/2023 14:06    MDM & MAU COURSE  MDM: High  MAU Course: -Tachycardic and febrile. -UA for UTI/pyelo. CBC and CMP for abdominal causes of pain and to evaluate for infection. Amylase/lipase to rule out pancreatitis. Respiratory panel to rule out viral causes of postpartum fever. -CBC with leukocytosis. CMP within normal limits. Lipase and amylase within normal limits. -UA negative for UTI. -Abdominal/pelvic CT to rule out appendicitis, diverticulitis given leukocytosis, tachycardia, and nonspecific exam.  -Start antibiotics to treat endometritis. -CT  shows 3 mm pulmonary nodule, recommend follow up with primary care. Negative for biliary disease, pancreatitis, appendicitis.  -Pelvic ultrasound to determine need for surgical intervention. -While waiting for US , patient informed RN that she needs to leave or have baby come to MAU to breastfeed. RN informed patient that husband can bring the baby for breastfeeding but must stay with the baby the entire time she breastfeeds and cannot leave. Explicitly told he cannot bring their other 2 children. Husband arrived to MAU with baby and 2 other children, RN again informed patient that 2 children must stay in lobby and husband must stay with baby while patient is breastfeeding. -RN returned to room to transport patient to US  and transfer baby back to husband, patient stated that he went home. RN informed again that infant cannot be left with patient. Patient attempted to call husband, no answer. -RN called Chaplin to watch baby to allow patient to have US  performed. -US  negative for products of conception, no need for surgery.  -Blood cultures and lactic acid given leukocytosis, initial fever, and tachycardia. -Recommend admission for total of 24-48 hours IV antibiotics for endometritis. Patient insists on leaving, still has infant. Husband not answering phone calls. Patient states she will return "tonight or tomorrow morning" once she finds someone who can watch the baby. -Multiple attempts have been made by RNs and provider throughout time in MAU today, from initial triage to most recent care, to establish patient's primary language and offer interpreter services to ensure thorough comprehension of medical care, condition, and associated risks. Clear during conversation that not all information was  understood, patient answers often did not match questions asked. Patient has repeatedly declined, insisting that she is able to understand and communicate in [place]. Patient does not share what other languages she  speaks.  -Provider and RN in room to discuss importance of thorough understanding and reasoning for interpreter if necessary, patient insists on continuing conversation in [place]. Provider informed of risks of leaving AMA and discontinuing IV antibiotics including progression of infection necessitating hysterectomy/loss of fertility or death. Patient asked to teach back risks of leaving AMA, understands that leaving is "dangerous".   Differential diagnosis considered for postpartum fever include but is not limited to: wound infection, endometritis, UTI, pyelonephritis, cervicitis, abscess, mastitis  Orders Placed This Encounter  Procedures   Resp panel by RT-PCR (RSV, Flu A&B, Covid) Anterior Nasal Swab   Culture, blood (Routine X 2) w Reflex to ID Panel   CT ABDOMEN PELVIS W CONTRAST   US  PELVIS (TRANSABDOMINAL ONLY)   Urinalysis, Routine w reflex microscopic -Urine, Clean Catch   Comprehensive metabolic panel   CBC   Amylase   Lipase, blood   Lactic acid, plasma   Meds ordered this encounter  Medications   piperacillin-tazobactam (ZOSYN) IVPB 3.375 g    Antibiotic Indication::   Other Indication (list below)    Other Indication::   endometritis   piperacillin-tazobactam (ZOSYN) IVPB 3.375 g    Antibiotic Indication::   Other Indication (list below)    Other Indication::   GBS positive   iohexol (OMNIPAQUE) 350 MG/ML injection 75 mL    ASSESSMENT   1. Postpartum endometritis   2. Leukocytosis, unspecified type   3. Postpartum state     PLAN  Recommendation to admit for endometritis treatment. Patient left AMA after 5 hours of IV Zosyn. She states she will return tonight or tomorrow morning.  Allergies as of 10/13/2023   (No Known Allergies)   Noreene Bearded, PA

## 2023-10-13 NOTE — MAU Note (Signed)
 In to take baby back to husband- pt states "he went home and is not here" US  called for US  to be done. Care for infant needing to established before US  can be done.   1515- Chaplin here to sit with baby for pt to go to US -- pt states my husband is not answering. Encouraged pt to get ahold of husband to pick up the baby

## 2023-10-13 NOTE — MAU Note (Signed)
 Pt out of room insistent on baby coming back but husband here with all 3 children. Baby back for pt to BF baby, pt instructed the husband cannot leave because pt cannot be responsible for baby only.

## 2023-10-15 ENCOUNTER — Encounter: Payer: Self-pay | Admitting: Obstetrics and Gynecology

## 2023-10-17 ENCOUNTER — Telehealth (HOSPITAL_COMMUNITY): Payer: Self-pay | Admitting: *Deleted

## 2023-10-17 NOTE — Telephone Encounter (Signed)
 Attempted hospital discharge follow-up call. Message received stating, Voicemail not set up. Please try your call again later.  Julien Odor, RN, 10/17/23, [phone]

## 2023-10-19 ENCOUNTER — Ambulatory Visit

## 2023-10-19 VITALS — BP 143/79 | HR 62 | Wt 217.0 lb

## 2023-10-19 DIAGNOSIS — Z0130 Encounter for examination of blood pressure without abnormal findings: Secondary | ICD-10-CM

## 2023-10-19 NOTE — Progress Notes (Signed)
 Blood Pressure Check Visit  Amanda Ellison is here for blood pressure check following spontaneous vaginal birth on 10/05/23. Patient had a diagnosis of cHTN. Patient was prescribed lasix  20 mg and potassium for 5 days along with Procardia  30 mg BID. Patient reports that she completed lasix  and potassium but has not taken BP medication since Sunday. BP today is 151/86. Repeat BP is 143/79. Patient denies any dizziness, blurred vision, headache, shortness of breath or peripheral edema. BP and chart reviewed with physician who states to take BP medication procardia  30 mg and to recheck BP 1 hour after administration. Educated patient how to take accurate BP at home. Notify patient to call our office if BP is >140/90 after taking medication. Per physician, BP check can be done during PP visit on 11/16/23 at 1035. Patient confirmed scheduled appointment.  Patient was seen in MAU on 10/13/23 for endometritis and did not complete IV antibiotics. Advised patient to return to MAU to complete IV medication per Maryclare Smoke, PA. Patient verbalized she will go back to MAU.   Lennart Quitter, RN 10/19/2023  2:03 PM

## 2023-10-23 ENCOUNTER — Encounter: Payer: Self-pay | Admitting: Obstetrics and Gynecology

## 2023-11-13 ENCOUNTER — Other Ambulatory Visit: Payer: Self-pay | Admitting: *Deleted

## 2023-11-13 DIAGNOSIS — Z8632 Personal history of gestational diabetes: Secondary | ICD-10-CM

## 2023-11-16 ENCOUNTER — Other Ambulatory Visit: Payer: Self-pay

## 2023-11-16 ENCOUNTER — Encounter: Payer: Self-pay | Admitting: Obstetrics and Gynecology

## 2023-11-16 ENCOUNTER — Other Ambulatory Visit

## 2023-11-16 ENCOUNTER — Ambulatory Visit: Payer: Self-pay | Admitting: Obstetrics and Gynecology

## 2023-11-16 VITALS — BP 116/85 | HR 83 | Wt 196.0 lb

## 2023-11-16 DIAGNOSIS — O8612 Endometritis following delivery: Secondary | ICD-10-CM

## 2023-11-16 DIAGNOSIS — Z8632 Personal history of gestational diabetes: Secondary | ICD-10-CM

## 2023-11-16 LAB — CBC
Hematocrit: 41 % (ref 34.0–46.6)
Hemoglobin: 12 g/dL (ref 11.1–15.9)
MCH: 23.8 pg — ABNORMAL LOW (ref 26.6–33.0)
MCHC: 29.3 g/dL — ABNORMAL LOW (ref 31.5–35.7)
MCV: 81 fL (ref 79–97)
Platelets: 317 x10E3/uL (ref 150–450)
RBC: 5.04 x10E6/uL (ref 3.77–5.28)
RDW: 18 % — ABNORMAL HIGH (ref 11.7–15.4)
WBC: 4.9 x10E3/uL (ref 3.4–10.8)

## 2023-11-16 MED ORDER — NORETHINDRONE 0.35 MG PO TABS
1.0000 | ORAL_TABLET | Freq: Every day | ORAL | 3 refills | Status: DC
  Filled 2023-11-16: qty 84, 84d supply, fill #0

## 2023-11-16 NOTE — Progress Notes (Signed)
 Post Partum Visit Note  Amanda Ellison is a 33 y.o. G60P3003 female who presents for a postpartum visit. She is 6 weeks postpartum following a normal spontaneous vaginal delivery.  I have fully reviewed the prenatal and intrapartum course. The delivery was at [redacted]w[redacted]d gestational weeks.  Anesthesia: epidural and local. Postpartum course has been complicated by possible PP endometritis - WBC was 17.6. She did not stay for antibiotic admission - but she did get one dose of IV Zosyn  and was given Augmentin  for home although she reports she did not take the Augmentin .  Baby is doing well. Baby is feeding by both breast and bottle - Similac Advance. Bleeding no bleeding. Bowel function is normal. Bladder function is normal. Patient is not sexually active. Contraception method is oral progesterone-only contraceptive. Postpartum depression screening: negative.   Upstream - 11/16/23 0939       Pregnancy Intention Screening   Does the patient want to become pregnant in the next year? No    Does the patient's partner want to become pregnant in the next year? No    Would the patient like to discuss contraceptive options today? No      Contraception Wrap Up   Current Method Oral Contraceptive    End Method Oral Contraceptive    Contraception Counseling Provided No    How was the end contraceptive method provided? N/A         The pregnancy intention screening data noted above was reviewed. Potential methods of contraception were discussed. The patient elected to proceed with Oral Contraceptive.   Edinburgh Postnatal Depression Scale - 11/16/23 0938       Edinburgh Postnatal Depression Scale:  In the Past 7 Days   I have been able to laugh and see the funny side of things. 0    I have looked forward with enjoyment to things. 0    I have blamed myself unnecessarily when things went wrong. 0    I have been anxious or worried for no good reason. 0    I have felt scared or panicky for no good  reason. 0    Things have been getting on top of me. 0    I have been so unhappy that I have had difficulty sleeping. 0    I have felt sad or miserable. 0    I have been so unhappy that I have been crying. 0    The thought of harming myself has occurred to me. 0    Edinburgh Postnatal Depression Scale Total 0          Health Maintenance Due  Topic Date Due   FOOT EXAM  Never done   OPHTHALMOLOGY EXAM  Never done   Diabetic kidney evaluation - Urine ACR  Never done   Pneumococcal Vaccine 3-3 Years old (1 of 2 - PCV) Never done   Hepatitis B Vaccines (1 of 3 - 19+ 3-dose series) Never done   HPV VACCINES (1 - 3-dose SCDM series) Never done   COVID-19 Vaccine (1 - 2024-25 season) Never done   HEMOGLOBIN A1C  09/27/2023    The following portions of the patient's history were reviewed and updated as appropriate: allergies, current medications, past family history, past medical history, past social history, past surgical history, and problem list.  Review of Systems Pertinent items are noted in HPI.  Objective:  BP 116/85   Pulse 83   Wt 196 lb (88.9 kg)   LMP 01/02/2023 (Exact Date)  Breastfeeding Yes   BMI 28.94 kg/m    General:  alert, cooperative, and no distress   Breasts:  not indicated  Abdomen: soft, non-tender; bowel sounds normal; no masses,  no organomegaly   GU exam:  not indicated       Assessment:   Amanda Ellison was seen today for postpartum care.  Diagnoses and all orders for this visit:  Postpartum endometritis -     CBC  Postpartum care and examination -     norethindrone  (MICRONOR ) 0.35 MG tablet; Take 1 tablet (0.35 mg total) by mouth daily.     Plan:   Essential components of care per ACOG recommendations:  1.  Mood and well being: Patient with negative depression screening today. Reviewed local resources for support.  - Patient tobacco use? No.   - hx of drug use? No.    2. Infant care and feeding:  -Patient currently  breastmilk feeding? Yes. Reviewed importance of draining breast regularly to support lactation.  -Social determinants of health (SDOH) reviewed in EPIC. No concerns  3. Sexuality, contraception and birth spacing - Patient does not want a pregnancy in the next year.  Desired family size is 3 children.  - Reviewed reproductive life planning. Reviewed contraceptive methods based on pt preferences and effectiveness.  Patient desired Oral Contraceptive today.   - Discussed birth spacing of 18 months  4. Sleep and fatigue -Encouraged family/partner/community support of 4 hrs of uninterrupted sleep to help with mood and fatigue  5. Physical Recovery  - Discussed patients delivery and complications. She describes her labor as good. - Patient had a Vaginal, no problems at delivery. Patient had a 2nd degree laceration. Perineal healing reviewed. Patient expressed understanding - Patient has urinary incontinence? No. - Patient is safe to resume physical and sexual activity  6.  Health Maintenance - HM due items addressed Yes - Last pap smear  Diagnosis  Date Value Ref Range Status  03/30/2023   Final   - Negative for intraepithelial lesion or malignancy (NILM)   Pap smear not done at today's visit.  -Breast Cancer screening indicated? No.   7. Chronic Disease/Pregnancy Condition follow up: Gestational Diabetes Will draw CBC today and temp was normal.   - PCP follow up  Vina Solian, MD Center for [facility], [facility]

## 2023-11-17 ENCOUNTER — Other Ambulatory Visit: Payer: Self-pay

## 2023-11-17 ENCOUNTER — Ambulatory Visit: Payer: Self-pay | Admitting: Obstetrics and Gynecology

## 2023-11-17 ENCOUNTER — Telehealth: Payer: Self-pay

## 2023-11-17 LAB — GLUCOSE TOLERANCE, 2 HOURS
Glucose, 2 hour: 54 mg/dL — ABNORMAL LOW (ref 70–139)
Glucose, GTT - Fasting: 87 mg/dL (ref 70–99)

## 2023-11-17 MED ORDER — NORETHINDRONE 0.35 MG PO TABS: 1.0000 | ORAL_TABLET | Freq: Every day | ORAL | 3 refills | Status: DC

## 2023-11-17 NOTE — Telephone Encounter (Addendum)
 After chart review and consulting situation with Dr. Cleatus, RN called pt to discuss medications.  Advised pt that her blood pressure was normal at her visit on 11/16/23 and Dr. Cleatus advised that she no longer needs medication to treat her blood pressure at this time.  She was only prescribed birth control pills.  Pt and her husband also voiced that their preferred [facility] pharmacy did not have an order.  Oral contraception reordered to pt preferred pharmacy.  Informed pt to check with [facility] tomorrow, they should now be able to fill her prescription.  Pt verbalized understanding with no further questions.    Waddell, RN

## 2023-11-17 NOTE — Telephone Encounter (Signed)
 Pharmacy called stating that the pt's husband is with them and she is suppose to have two Rx's.  One for BP and the other for contraception and there is no order. Please contact us  for follow up.   Clevland Cork,RN

## 2023-11-18 NOTE — Telephone Encounter (Signed)
 Pt notified of glucose and CBC normal.    Nadene Witherspoon,RN

## 2024-01-15 ENCOUNTER — Telehealth: Payer: Self-pay | Admitting: Family Medicine

## 2024-01-15 ENCOUNTER — Other Ambulatory Visit: Payer: Self-pay

## 2024-01-15 MED ORDER — NORETHINDRONE 0.35 MG PO TABS
1.0000 | ORAL_TABLET | Freq: Every day | ORAL | 3 refills | Status: DC
  Filled 2024-01-15: qty 84, 84d supply, fill #0

## 2024-01-15 NOTE — Telephone Encounter (Signed)
 Returned pt call.  She reports having issue with birthcontrol refill, requested order to [facility] pharmacy.   Waddell, RN

## 2024-01-15 NOTE — Telephone Encounter (Signed)
 Patient called and said her pharmacy said she needed to call her Dr. Lawerance because she is trying to get a refill on her prescription

## 2024-04-13 ENCOUNTER — Other Ambulatory Visit: Payer: Self-pay

## 2024-04-13 ENCOUNTER — Telehealth: Payer: Self-pay | Admitting: Family Medicine

## 2024-04-13 MED ORDER — NORETHINDRONE 0.35 MG PO TABS
1.0000 | ORAL_TABLET | Freq: Every day | ORAL | 3 refills | Status: AC
  Filled 2024-04-13: qty 84, 84d supply, fill #0

## 2024-04-13 NOTE — Telephone Encounter (Signed)
 Patient called saying that she needs her birth control sent to the pharmacy. She does not know which BC it is.

## 2024-04-13 NOTE — Telephone Encounter (Signed)
 Prescription refilled.   Cyndee Molt, RN

## 2024-04-19 ENCOUNTER — Other Ambulatory Visit: Payer: Self-pay
